# Patient Record
Sex: Female | Born: 1955 | Race: Black or African American | Hispanic: No | Marital: Single | Smoking: Never smoker
Health system: Southern US, Community
[De-identification: ages and names within clinical notes are randomized; demographics above are authoritative.]

## PROBLEM LIST (undated history)

## (undated) DIAGNOSIS — K08109 Complete loss of teeth, unspecified cause, unspecified class: Secondary | ICD-10-CM

## (undated) DIAGNOSIS — I10 Essential (primary) hypertension: Secondary | ICD-10-CM

## (undated) DIAGNOSIS — G5600 Carpal tunnel syndrome, unspecified upper limb: Secondary | ICD-10-CM

## (undated) DIAGNOSIS — N858 Other specified noninflammatory disorders of uterus: Secondary | ICD-10-CM

## (undated) DIAGNOSIS — G5603 Carpal tunnel syndrome, bilateral upper limbs: Secondary | ICD-10-CM

## (undated) DIAGNOSIS — Z972 Presence of dental prosthetic device (complete) (partial): Secondary | ICD-10-CM

## (undated) DIAGNOSIS — M1990 Unspecified osteoarthritis, unspecified site: Secondary | ICD-10-CM

## (undated) DIAGNOSIS — E119 Type 2 diabetes mellitus without complications: Secondary | ICD-10-CM

## (undated) HISTORY — PX: MULTIPLE TOOTH EXTRACTIONS: SHX2053

## (undated) HISTORY — DX: Essential (primary) hypertension: I10

---

## 1998-08-15 ENCOUNTER — Encounter: Payer: Self-pay | Admitting: Infectious Diseases

## 1998-08-15 ENCOUNTER — Ambulatory Visit (HOSPITAL_COMMUNITY): Admission: RE | Admit: 1998-08-15 | Discharge: 1998-08-15 | Payer: Self-pay | Admitting: Infectious Diseases

## 2002-01-06 ENCOUNTER — Ambulatory Visit (HOSPITAL_COMMUNITY): Admission: RE | Admit: 2002-01-06 | Discharge: 2002-01-06 | Payer: Self-pay | Admitting: Family Medicine

## 2002-01-06 ENCOUNTER — Encounter: Payer: Self-pay | Admitting: Family Medicine

## 2002-01-20 ENCOUNTER — Encounter: Admission: RE | Admit: 2002-01-20 | Discharge: 2002-01-20 | Payer: Self-pay | Admitting: Family Medicine

## 2002-01-20 ENCOUNTER — Encounter: Payer: Self-pay | Admitting: Family Medicine

## 2002-05-13 ENCOUNTER — Emergency Department (HOSPITAL_COMMUNITY): Admission: EM | Admit: 2002-05-13 | Discharge: 2002-05-13 | Payer: Self-pay | Admitting: Emergency Medicine

## 2002-06-21 ENCOUNTER — Emergency Department (HOSPITAL_COMMUNITY): Admission: EM | Admit: 2002-06-21 | Discharge: 2002-06-21 | Payer: Self-pay | Admitting: *Deleted

## 2003-10-11 ENCOUNTER — Emergency Department (HOSPITAL_COMMUNITY): Admission: AD | Admit: 2003-10-11 | Discharge: 2003-10-11 | Payer: Self-pay | Admitting: *Deleted

## 2004-03-13 ENCOUNTER — Emergency Department (HOSPITAL_COMMUNITY): Admission: EM | Admit: 2004-03-13 | Discharge: 2004-03-13 | Payer: Self-pay | Admitting: Emergency Medicine

## 2004-04-21 ENCOUNTER — Emergency Department (HOSPITAL_COMMUNITY): Admission: EM | Admit: 2004-04-21 | Discharge: 2004-04-21 | Payer: Self-pay | Admitting: Emergency Medicine

## 2004-12-26 ENCOUNTER — Emergency Department (HOSPITAL_COMMUNITY): Admission: EM | Admit: 2004-12-26 | Discharge: 2004-12-26 | Payer: Self-pay | Admitting: Family Medicine

## 2004-12-27 ENCOUNTER — Ambulatory Visit (HOSPITAL_COMMUNITY): Admission: RE | Admit: 2004-12-27 | Discharge: 2004-12-27 | Payer: Self-pay | Admitting: Family Medicine

## 2004-12-27 ENCOUNTER — Emergency Department (HOSPITAL_COMMUNITY): Admission: EM | Admit: 2004-12-27 | Discharge: 2004-12-27 | Payer: Self-pay | Admitting: Family Medicine

## 2005-01-17 ENCOUNTER — Emergency Department (HOSPITAL_COMMUNITY): Admission: EM | Admit: 2005-01-17 | Discharge: 2005-01-17 | Payer: Self-pay | Admitting: Family Medicine

## 2005-01-17 ENCOUNTER — Ambulatory Visit (HOSPITAL_COMMUNITY): Admission: RE | Admit: 2005-01-17 | Discharge: 2005-01-17 | Payer: Self-pay | Admitting: Family Medicine

## 2005-04-13 ENCOUNTER — Emergency Department (HOSPITAL_COMMUNITY): Admission: EM | Admit: 2005-04-13 | Discharge: 2005-04-13 | Payer: Self-pay | Admitting: Family Medicine

## 2005-06-19 ENCOUNTER — Emergency Department (HOSPITAL_COMMUNITY): Admission: EM | Admit: 2005-06-19 | Discharge: 2005-06-19 | Payer: Self-pay | Admitting: Emergency Medicine

## 2006-12-28 ENCOUNTER — Encounter: Payer: Self-pay | Admitting: Family Medicine

## 2008-01-19 ENCOUNTER — Encounter: Payer: Self-pay | Admitting: Family Medicine

## 2008-07-27 ENCOUNTER — Emergency Department (HOSPITAL_COMMUNITY): Admission: EM | Admit: 2008-07-27 | Discharge: 2008-07-27 | Payer: Self-pay | Admitting: Emergency Medicine

## 2009-01-17 ENCOUNTER — Ambulatory Visit: Payer: Self-pay | Admitting: Family Medicine

## 2009-01-17 DIAGNOSIS — I10 Essential (primary) hypertension: Secondary | ICD-10-CM

## 2009-03-09 ENCOUNTER — Encounter: Payer: Self-pay | Admitting: Family Medicine

## 2009-03-09 ENCOUNTER — Ambulatory Visit: Payer: Self-pay | Admitting: Family Medicine

## 2009-03-09 DIAGNOSIS — E669 Obesity, unspecified: Secondary | ICD-10-CM

## 2009-04-02 ENCOUNTER — Ambulatory Visit: Payer: Self-pay | Admitting: Family Medicine

## 2009-04-02 ENCOUNTER — Encounter: Payer: Self-pay | Admitting: Family Medicine

## 2009-04-02 DIAGNOSIS — R011 Cardiac murmur, unspecified: Secondary | ICD-10-CM

## 2009-04-02 LAB — CONVERTED CEMR LAB
BUN: 10 mg/dL (ref 6–23)
Calcium: 9.4 mg/dL (ref 8.4–10.5)
Chloride: 103 meq/L (ref 96–112)
Creatinine, Ser: 0.79 mg/dL (ref 0.40–1.20)
HCT: 38.1 % (ref 36.0–46.0)
Hemoglobin: 12.7 g/dL (ref 12.0–15.0)
MCHC: 33.3 g/dL (ref 30.0–36.0)
MCV: 87.4 fL (ref 78.0–100.0)
Platelets: 285 10*3/uL (ref 150–400)
RDW: 14.3 % (ref 11.5–15.5)

## 2009-04-04 ENCOUNTER — Encounter: Payer: Self-pay | Admitting: Family Medicine

## 2009-04-20 ENCOUNTER — Encounter: Payer: Self-pay | Admitting: Family Medicine

## 2009-04-20 ENCOUNTER — Ambulatory Visit: Payer: Self-pay

## 2009-04-23 ENCOUNTER — Encounter: Payer: Self-pay | Admitting: Family Medicine

## 2009-05-02 ENCOUNTER — Encounter: Admission: RE | Admit: 2009-05-02 | Discharge: 2009-05-02 | Payer: Self-pay | Admitting: Family Medicine

## 2009-05-24 ENCOUNTER — Encounter: Payer: Self-pay | Admitting: Family Medicine

## 2009-06-01 ENCOUNTER — Ambulatory Visit: Payer: Self-pay | Admitting: Family Medicine

## 2009-06-01 ENCOUNTER — Encounter: Payer: Self-pay | Admitting: Family Medicine

## 2009-06-01 LAB — CONVERTED CEMR LAB
HDL: 41 mg/dL (ref >39)
LDL Cholesterol: 80 mg/dL (ref 0–99)
Triglycerides: 59 mg/dL (ref <150)

## 2009-06-24 ENCOUNTER — Encounter: Payer: Self-pay | Admitting: Family Medicine

## 2009-06-29 ENCOUNTER — Encounter (INDEPENDENT_AMBULATORY_CARE_PROVIDER_SITE_OTHER): Payer: Self-pay | Admitting: Family Medicine

## 2009-06-29 ENCOUNTER — Ambulatory Visit: Payer: Self-pay | Admitting: Family Medicine

## 2009-07-03 ENCOUNTER — Encounter: Payer: Self-pay | Admitting: Family Medicine

## 2009-09-18 ENCOUNTER — Ambulatory Visit: Payer: Self-pay | Admitting: Family Medicine

## 2010-03-29 ENCOUNTER — Encounter: Payer: Self-pay | Admitting: Family Medicine

## 2010-04-22 ENCOUNTER — Encounter: Payer: Self-pay | Admitting: *Deleted

## 2010-04-22 ENCOUNTER — Encounter: Payer: Self-pay | Admitting: Family Medicine

## 2010-04-22 ENCOUNTER — Ambulatory Visit: Payer: Self-pay | Admitting: Family Medicine

## 2010-04-22 DIAGNOSIS — R609 Edema, unspecified: Secondary | ICD-10-CM

## 2010-04-22 LAB — CONVERTED CEMR LAB
CO2: 23 meq/L (ref 19–32)
Calcium: 9.2 mg/dL (ref 8.4–10.5)
Chloride: 106 meq/L (ref 96–112)
Creatinine, Ser: 0.69 mg/dL (ref 0.40–1.20)
Glucose, Bld: 84 mg/dL (ref 70–99)
Sodium: 140 meq/L (ref 135–145)

## 2010-04-23 ENCOUNTER — Encounter: Payer: Self-pay | Admitting: Family Medicine

## 2010-06-24 ENCOUNTER — Encounter: Payer: Self-pay | Admitting: Family Medicine

## 2010-12-08 ENCOUNTER — Encounter: Payer: Self-pay | Admitting: Family Medicine

## 2010-12-09 ENCOUNTER — Encounter: Payer: Self-pay | Admitting: Family Medicine
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
## 2010-12-17 NOTE — Letter (Signed)
Summary: Out of Work  [facility]  [address]   [place], [place] [address]   Phone: [phone]  Fax: [phone]    April 22, 2010   Employee:  Janet Woods    To Whom It May Concern:   For Medical reasons, please excuse the above named employee from work for the following dates:  Start:   April 22, 2010   If you need additional information, please feel free to contact our office.         Sincerely,    Dennison Nancy RN

## 2010-12-17 NOTE — Letter (Signed)
Summary: Generic Letter  [facility]  [address]   [place], [place] [address]   Phone: [phone]  Fax: [phone]    04/23/2010  Janet Woods [address] [place], [place]  [address]  Dear [name],    I just wanted to let you know that your lab results were normal.  Please call me if you have any questions or concerns.          Sincerely,   Asher Muir MD  Appended Document: Generic Letter mailed.

## 2010-12-17 NOTE — Letter (Signed)
Summary: Generic Letter  [facility]  [address]   [place], [place] [address]   Phone: [phone]  Fax: [phone]    03/29/2010  TRINITI GRUETZMACHER [address] [place], [place]  [address]  Dear Ms. Noguez,   You have not been seen in our office in some time.  You are due for a blood pressure check and labs.  Please call [phone] and schedule an office visit.  I look forward to seeing you.         Sincerely,   Asher Muir MD  Appended Document: Generic Letter mailed.

## 2010-12-17 NOTE — Consult Note (Signed)
Summary: [facility]   Imported By: De Nurse 07/04/2010 11:42:39  _____________________________________________________________________  External Attachment:    Type:   Image     Comment:   External Document

## 2011-01-03 ENCOUNTER — Encounter: Payer: Self-pay | Admitting: *Deleted

## 2011-01-21 ENCOUNTER — Encounter: Payer: Self-pay | Admitting: *Deleted

## 2011-04-25 ENCOUNTER — Encounter: Payer: Self-pay | Admitting: *Deleted

## 2011-06-08 ENCOUNTER — Emergency Department (HOSPITAL_COMMUNITY)
Admission: EM | Admit: 2011-06-08 | Discharge: 2011-06-08 | Disposition: A | Discharge status: Home / Self Care | Payer: Self-pay | Attending: Emergency Medicine | Admitting: Emergency Medicine

## 2011-06-08 DIAGNOSIS — L259 Unspecified contact dermatitis, unspecified cause: Secondary | ICD-10-CM | POA: Insufficient documentation

## 2011-06-08 DIAGNOSIS — Z79899 Other long term (current) drug therapy: Secondary | ICD-10-CM | POA: Insufficient documentation

## 2011-06-08 DIAGNOSIS — I10 Essential (primary) hypertension: Secondary | ICD-10-CM | POA: Insufficient documentation

## 2011-07-16 ENCOUNTER — Other Ambulatory Visit: Payer: Self-pay | Admitting: Family Medicine

## 2011-07-16 MED ORDER — HYDROCHLOROTHIAZIDE 25 MG PO TABS: 25.0000 mg | ORAL_TABLET | Freq: Every day | ORAL | Status: DC

## 2012-01-02 ENCOUNTER — Ambulatory Visit (INDEPENDENT_AMBULATORY_CARE_PROVIDER_SITE_OTHER): Payer: BC Managed Care – PPO | Admitting: Family Medicine

## 2012-01-02 ENCOUNTER — Encounter: Payer: Self-pay | Admitting: Family Medicine

## 2012-01-02 DIAGNOSIS — G5600 Carpal tunnel syndrome, unspecified upper limb: Secondary | ICD-10-CM

## 2012-01-02 DIAGNOSIS — G5603 Carpal tunnel syndrome, bilateral upper limbs: Secondary | ICD-10-CM

## 2012-01-02 DIAGNOSIS — I10 Essential (primary) hypertension: Secondary | ICD-10-CM

## 2012-01-02 MED ORDER — NAPROXEN 500 MG PO TABS: 500.0000 mg | ORAL_TABLET | Freq: Two times a day (BID) | ORAL | Status: DC

## 2012-01-02 MED ORDER — KETOROLAC TROMETHAMINE 60 MG/2ML IM SOLN
60.0000 mg | Freq: Once | INTRAMUSCULAR | Status: AC
  Administered 2012-01-02: 60 mg via INTRAMUSCULAR

## 2012-01-02 MED ORDER — KETOROLAC TROMETHAMINE 60 MG/2ML IM SOLN: 60.0000 mg | Freq: Once | INTRAMUSCULAR | Status: DC

## 2012-01-02 MED ORDER — HYDROCHLOROTHIAZIDE 25 MG PO TABS: 25.0000 mg | ORAL_TABLET | Freq: Every day | ORAL | Status: DC

## 2012-01-02 NOTE — Patient Instructions (Signed)
It was nice to meet you today. I will give you a shot of pain medication today. Please go to [facility] and pick up prescriptions for naproxen and HCTZ. Please schedule followup appointment with Dr. Cristal Ford in one month. If symptoms worsen, please return to clinic as needed. Have a good weekend.  Carpal Tunnel Syndrome The carpal tunnel is a narrow hollow area in the wrist. It is formed by the wrist bones and ligaments. Nerves, blood vessels, and tendons (cord like structures which attach muscle to bone) on the palm side (the side of your hand in the direction your fingers bend) of your hand pass through the carpal tunnel. Repeated wrist motion or certain diseases may cause swelling within the tunnel. (That is why these are called repetitive trauma (damage caused by over use) disorders. It is also a common problem in late pregnancy.) This swelling pinches the main nerve in the wrist (median nerve) and causes the painful condition called carpal tunnel syndrome. A feeling of "pins and needles" may be noticed in the fingers or hand; however, the entire arm may ache from this condition. Carpal tunnel syndrome may clear up by itself. Cortisone injections may help. Sometimes, an operation may be needed to free the pinched nerve. An electromyogram (a type of test) may be needed to confirm this diagnosis (learning what is wrong). This is a test which measures nerve conduction. The nerve conduction is usually slowed in a carpal tunnel syndrome. HOME CARE INSTRUCTIONS   If your caregiver prescribed medication to help reduce swelling, take as directed.   If you were given a splint to keep your wrist from bending, use it as instructed. It is important to wear the splint at night. Use the splint for as long as you have pain or numbness in your hand, arm or wrist. This may take 1 to 2 months.   If you have pain at night, it may help to rub or shake your hand, or elevate your hand above the level of your heart (the  center of your chest).   It is important to give your wrist a rest by stopping the activities that are causing the problem. If your symptoms (problems) are work-related, you may need to talk to your employer about changing to a job that does not require using your wrist.   Only take over-the-counter or prescription medicines for pain, discomfort, or fever as directed by your caregiver.   Following periods of extended use, particularly strenuous use, apply an ice pack wrapped in a towel to the anterior (palm) side of the affected wrist for 20 to 30 minutes. Repeat as needed three to four times per day. This will help reduce the swelling.   Follow all instructions for follow-up with your caregiver. This includes any orthopedic referrals, physical therapy, and rehabilitation. Any delay in obtaining necessary care could result in a delay or failure of your condition to heal.  SEEK IMMEDIATE MEDICAL CARE IF:   You are still having pain and numbness following a week of treatment.   You develop new, unexplained symptoms.   Your current symptoms are getting worse and are not helped or controlled with medications.  MAKE SURE YOU:   Understand these instructions.   Will watch your condition.   Will get help right away if you are not doing well or get worse.  Document Released: 10/31/2000 Document Revised: 07/16/2011 Document Reviewed: 06/07/2008 [facility] Patient Information 2012 [place], [place].

## 2012-01-02 NOTE — Progress Notes (Signed)
  Subjective:    Patient ID: [name], female    DOB: [birth year], 56 y.o.   MRN: [id number]  HPI  Patient presented to clinic today for bilateral wrist pain.  Per patient, she has a history of carpal tunnel syndrome. She typically goes to the ER for pain control. Patient has worse pain in her right hand, but left hand started to hurt at work today. She works as a Lawyer. Pain is a 9/10 and became so severe that she had to leave work. Patient has not tried any over-the-counter Tylenol or ibuprofen, because she says it has not worked in the past. The only medication that seems to work is naproxen 500 mg. Patient refuses to pursue surgery as a treatment option because her sister had carpal tunnel surgery and it did not help.  Patient complains of pain mostly in her middle, fourth and fifth fingers and worsening numbness and tingling at night.  Review of Systems  Denies fever, chills, night sweats, erythema, rash or swelling of extremities.     Objective:   Physical Exam  Constitutional: No distress.  HENT:  Mouth/Throat: Oropharynx is clear and moist.  Musculoskeletal:       Mild tenderness on palpation of bilateral fingers, but no swelling or erythema.  No bony tenderness.    Neurological:       Able to flex and extend wrists and fingers.  5/5 UE motor strength.  Sensation intact.         Assessment & Plan:

## 2012-01-02 NOTE — Progress Notes (Signed)
Addended by: Deno Etienne on: 01/02/2012 01:04 PM   Modules accepted: Orders

## 2012-01-02 NOTE — Assessment & Plan Note (Addendum)
Pressure elevated today 160/92.  Patient is running out of HCTZ. Will refill prescription with one refill. Advised patient to schedule followup appointment with Dr. Cristal Ford in one month to discuss chronic issues. Advised patient to be a low-sodium diet and increase physical activity.

## 2012-01-02 NOTE — Assessment & Plan Note (Addendum)
Naproxen 500 mg twice a day with meals for one month. Toradol 60 mg IM given in office today. Advised patient to wear her wrist splint over the weekend. Last BMET was in 2011 and creatinine was within normal limits. Patient is not interested in nerve conduction studies or surgery at this time. Follow-up with PCP next month to discuss further treatment and plan.

## 2012-02-27 ENCOUNTER — Telehealth: Payer: Self-pay | Admitting: Family Medicine

## 2012-02-27 NOTE — Telephone Encounter (Signed)
Patient is calling needing a statement for workplace stating that she did have a physical and lab work last year.  Please call her when it is ready to be picked up.

## 2012-03-01 NOTE — Telephone Encounter (Signed)
Guess she needs an appointment for CPE. Thanks Lupita Leash.

## 2012-03-01 NOTE — Telephone Encounter (Signed)
Last physical was 06/29/2009.  Last labs drawn was a BMET on 04/22/2010.  Patient did not have a physical last year according to our records. Called patient but there was no answer.  Will try calling again later.  Ileana Ladd

## 2012-03-01 NOTE — Telephone Encounter (Signed)
Spoke with Ms. Janet Woods.  Informed her that her last CPE was in 2010.  Requesting an appointment this week due to her employer is taking extra money out of her paycheck until she has documentation of a recent physical.   Appointment scheduled for CPE with Dr. Earnest Bailey 03/04/12 @ 8:45am.  .  Ileana Ladd

## 2012-03-01 NOTE — Telephone Encounter (Signed)
Can you help me with this?

## 2012-03-04 ENCOUNTER — Other Ambulatory Visit (HOSPITAL_COMMUNITY)
Admission: RE | Admit: 2012-03-04 | Discharge: 2012-03-04 | Disposition: A | Discharge status: Home / Self Care | Payer: BC Managed Care – PPO | Source: Ambulatory Visit | Attending: Family Medicine | Admitting: Family Medicine

## 2012-03-04 ENCOUNTER — Ambulatory Visit (INDEPENDENT_AMBULATORY_CARE_PROVIDER_SITE_OTHER): Payer: BC Managed Care – PPO | Admitting: Family Medicine

## 2012-03-04 ENCOUNTER — Encounter: Payer: Self-pay | Admitting: Family Medicine

## 2012-03-04 VITALS — BP 141/91 | HR 90 | Temp 98.3°F | Ht 65.0 in | Wt 250.2 lb

## 2012-03-04 DIAGNOSIS — Z01419 Encounter for gynecological examination (general) (routine) without abnormal findings: Secondary | ICD-10-CM | POA: Insufficient documentation

## 2012-03-04 DIAGNOSIS — R0609 Other forms of dyspnea: Secondary | ICD-10-CM

## 2012-03-04 DIAGNOSIS — E669 Obesity, unspecified: Secondary | ICD-10-CM | POA: Insufficient documentation

## 2012-03-04 DIAGNOSIS — Z124 Encounter for screening for malignant neoplasm of cervix: Secondary | ICD-10-CM

## 2012-03-04 DIAGNOSIS — R0600 Dyspnea, unspecified: Secondary | ICD-10-CM

## 2012-03-04 DIAGNOSIS — R609 Edema, unspecified: Secondary | ICD-10-CM

## 2012-03-04 DIAGNOSIS — G5603 Carpal tunnel syndrome, bilateral upper limbs: Secondary | ICD-10-CM

## 2012-03-04 DIAGNOSIS — Z0000 Encounter for general adult medical examination without abnormal findings: Secondary | ICD-10-CM

## 2012-03-04 DIAGNOSIS — G5600 Carpal tunnel syndrome, unspecified upper limb: Secondary | ICD-10-CM

## 2012-03-04 DIAGNOSIS — I10 Essential (primary) hypertension: Secondary | ICD-10-CM

## 2012-03-04 MED ORDER — HYDROCHLOROTHIAZIDE 25 MG PO TABS: 25.0000 mg | ORAL_TABLET | Freq: Every day | ORAL | Status: DC

## 2012-03-04 MED ORDER — NAPROXEN 500 MG PO TABS: 500.0000 mg | ORAL_TABLET | Freq: Two times a day (BID) | ORAL | Status: DC

## 2012-03-04 NOTE — Progress Notes (Signed)
  Subjective:    Patient ID: Janet Woods, female    DOB: Mar 31, 1956, 56 y.o.   MRN: [id number]  HPI Annual Gynecological Exam  G2P2 Wt Readings from Last 3 Encounters:  03/04/12 250 lb 3.2 oz (113.49 kg)  01/02/12 233 lb (105.688 kg)  04/22/10 234 lb (106.142 kg)   Last period:  postmenopausal Regular periods: no Heavy bleeding: no  Sexually active: no Birth control or hormonal therapy:no Hx of STD: Patient desires STD screening Dyspareunia: No Hot flashes: No Vaginal discharge:  Dysuria:No   Last mammogram: never  Breast mass or concerns: No Last Pap: not due  History of abnormal pap: No   FH of breast, uterine, ovarian, colon cancer: No  Carpal tunnel:  Swelling in hands, a little in feet.  For many months.  Some dyspnea, attributes this to weight gain.  Increased 17 pounds in the past 2 months.  Numbness and tingling in all 4 fingers not in thumb.  Bilateral, sometimes wakes her up from sleep.  Feels grip is weaker.   Review of Systems See HPI    Objective:   Physical Exam GEN: Alert & Oriented, No acute distress CV:  Regular Rate & Rhythm, no murmur Respiratory:  Normal work of breathing, CTAB Abd:  + BS, soft, no tenderness to palpation Ext: no pre-tibial edema MSK:  Neg tinel and phalen.         Assessment & Plan:  Physical exam:  Given referral for mammography.  Fasting labs performed.  Screening otherwise up todate

## 2012-03-04 NOTE — Patient Instructions (Addendum)
Will get labwork to check for diabetes, cholesterol, anemia, kidney and liver function and thyroid.  Cut back on added salt- can cause yoru blood pressure to be higher and cause you to retain fluid  I refilled naproxen- careful- daily use can cause your blood pressure to be higher and can irritate your stomach lining  See info on carpal tunnel- wear a wrist brace nightly  See info for mammogram  Make appointment to see your primary doctor in 2-3 weeks to follow-up on your swelling, your wrist, and your labs

## 2012-03-05 ENCOUNTER — Encounter: Payer: Self-pay | Admitting: Family Medicine

## 2012-03-05 LAB — COMPREHENSIVE METABOLIC PANEL
Alkaline Phosphatase: 56 U/L (ref 39–117)
CO2: 26 mEq/L (ref 19–32)
Creat: 0.7 mg/dL (ref 0.50–1.10)
Glucose, Bld: 98 mg/dL (ref 70–99)
Total Bilirubin: 0.2 mg/dL — ABNORMAL LOW (ref 0.3–1.2)

## 2012-03-05 LAB — CBC
Hemoglobin: 12.2 g/dL (ref 12.0–15.0)
MCH: 27.9 pg (ref 26.0–34.0)
MCHC: 31 g/dL (ref 30.0–36.0)
MCV: 89.9 fL (ref 78.0–100.0)
RBC: 4.37 MIL/uL (ref 3.87–5.11)

## 2012-03-05 LAB — BRAIN NATRIURETIC PEPTIDE: Brain Natriuretic Peptide: 14.4 pg/mL (ref 0.0–100.0)

## 2012-03-05 LAB — LIPID PANEL
Cholesterol: 166 mg/dL (ref 0–200)
Triglycerides: 97 mg/dL (ref <150)

## 2012-03-05 LAB — TSH: TSH: 1.221 u[IU]/mL (ref 0.350–4.500)

## 2012-03-05 NOTE — Assessment & Plan Note (Signed)
Slightly elevated- advised to cut back on added salt uses a lot extra daily) which can help with HTN control and edema.  Will get labs and follow-up with PCP in 2-3 weeks

## 2012-03-05 NOTE — Assessment & Plan Note (Signed)
Not classic for carpal tunnel.  Will give her a right hand wrist splint today to wear, see if this helps with her most affected hand.  If no improvement, may consider referral for NCS.

## 2012-03-05 NOTE — Assessment & Plan Note (Signed)
Some LE, mostly hands.  Possibly due to nerve compression.  BNP and Cr normal.  Non inflammatory disease. Will follow-up with PCP for further history and full evaluation.

## 2012-04-19 ENCOUNTER — Telehealth: Payer: Self-pay | Admitting: Family Medicine

## 2012-04-19 NOTE — Telephone Encounter (Signed)
FMLA paper to be completed by [employer]. Pt takes care of daughter, [name] DOB 11/26/86

## 2012-04-21 NOTE — Telephone Encounter (Signed)
Dates of service filled in as requested by Dr. Cristal Ford.  Copy placed up front to be scanned.  Ms. Schubring notified FMLA forms are ready to be picked up at front desk.  Janet Woods

## 2012-04-21 NOTE — Telephone Encounter (Signed)
Form completed with exception of dates of service. Cannot find dates in Epic. Returned to [employer] for assistance.

## 2012-07-01 ENCOUNTER — Telehealth: Payer: Self-pay | Admitting: Family Medicine

## 2012-07-01 ENCOUNTER — Encounter: Payer: Self-pay | Admitting: *Deleted

## 2012-07-01 NOTE — Telephone Encounter (Signed)
Patient needs a note stating that she had a Pap smear done in the past year. Needs the date and the provider's names who perform pap. Please call patient when completed.

## 2012-07-01 NOTE — Telephone Encounter (Signed)
Called patient and informed her that letter is up front to be picked up

## 2012-07-15 ENCOUNTER — Encounter: Payer: Self-pay | Admitting: Family Medicine

## 2012-07-15 ENCOUNTER — Ambulatory Visit (INDEPENDENT_AMBULATORY_CARE_PROVIDER_SITE_OTHER): Payer: BC Managed Care – PPO | Admitting: Family Medicine

## 2012-07-15 VITALS — BP 134/81 | HR 66 | Temp 98.9°F | Wt 273.5 lb

## 2012-07-15 DIAGNOSIS — I10 Essential (primary) hypertension: Secondary | ICD-10-CM

## 2012-07-15 DIAGNOSIS — R609 Edema, unspecified: Secondary | ICD-10-CM

## 2012-07-15 DIAGNOSIS — G5600 Carpal tunnel syndrome, unspecified upper limb: Secondary | ICD-10-CM

## 2012-07-15 DIAGNOSIS — G5603 Carpal tunnel syndrome, bilateral upper limbs: Secondary | ICD-10-CM

## 2012-07-15 DIAGNOSIS — K219 Gastro-esophageal reflux disease without esophagitis: Secondary | ICD-10-CM | POA: Insufficient documentation

## 2012-07-15 MED ORDER — NAPROXEN 500 MG PO TABS: 500.0000 mg | ORAL_TABLET | Freq: Two times a day (BID) | ORAL | Status: DC

## 2012-07-15 MED ORDER — ESOMEPRAZOLE MAGNESIUM 40 MG PO CPDR: 40.0000 mg | DELAYED_RELEASE_CAPSULE | Freq: Every day | ORAL | Status: DC

## 2012-07-15 NOTE — Assessment & Plan Note (Signed)
Well controlled, will refill HCTZ

## 2012-07-15 NOTE — Progress Notes (Signed)
  Subjective:    Patient ID: Janet Woods, female    DOB: October 26, 1956, 56 y.o.   MRN: [id number]  HPI Here for eval GERD., Carpal tunnel, refill HTN  GERD: Burning sensation.  Triggered by certain foods.  Was given nexium from an urgent care doctor while on vacation earlier this summer.  Resolved her heartburn.  Started to come back when not taking.  No dyspnea, nause, emesis.  Carpal tunnel.  Previously given wrist brace to wear at night for right hand.  Has decreased symptoms dramatically.  Also wearing now on her left wrist with improvement.  Takes naproxen once a day which also helps.  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  Hypertension:  Doing well.  NO chest pain, dyspnea.  Som intermittent left leg edema- previously has surgery in that leg- none new.  Review of Systemssee HPI     Objective:   Physical Exam GEN: Alert & Oriented, No acute distress CV:  Regular Rate & Rhythm, no murmur Respiratory:  Normal work of breathing, CTAB Ext: no pre-tibial edema Wrist: neg tinel, phalen       Assessment & Plan:

## 2012-07-15 NOTE — Assessment & Plan Note (Signed)
Improved- doing well with conservative management with night braces and naproxen.  Does not desire surgery.  Will continue treatment for now

## 2012-07-15 NOTE — Assessment & Plan Note (Signed)
Minimal today.  Reassured, discussed signs of DVT for return.

## 2012-07-15 NOTE — Assessment & Plan Note (Signed)
Clinical symptoms consistent with GERD.  Will refill nexium per patient request, advised on diet modification that can also help with symtpoms.

## 2012-07-15 NOTE — Patient Instructions (Addendum)
Diet for GERD or PUD Nutrition therapy can help ease the discomfort of gastroesophageal reflux disease (GERD) and peptic ulcer disease (PUD).  HOME CARE INSTRUCTIONS   Eat your meals slowly, in a relaxed setting.   Eat 5 to 6 small meals per day.   If a food causes distress, stop eating it for a period of time.  FOODS TO AVOID  Coffee, regular or decaffeinated.   Cola beverages, regular or low calorie.   Tea, regular or decaffeinated.   Pepper.   Cocoa.   High fat foods, including meats.   Butter, margarine, hydrogenated oil (trans fats).   Peppermint or spearmint (if you have GERD).   Fruits and vegetables if not tolerated.   Alcohol.   Nicotine (smoking or chewing). This is one of the most potent stimulants to acid production in the gastrointestinal tract.   Any food that seems to aggravate your condition.  If you have questions regarding your diet, ask your caregiver or a registered dietitian. TIPS  Lying flat may make symptoms worse. Keep the head of your bed raised 6 to 9 inches (15 to 23 cm) by using a foam wedge or blocks under the legs of the bed.   Do not lay down until 3 hours after eating a meal.   Daily physical activity may help reduce symptoms.  MAKE SURE YOU:   Understand these instructions.   Will watch your condition.   Will get help right away if you are not doing well or get worse.  Document Released: 11/03/2005 Document Revised: 10/23/2011 Document Reviewed: 09/19/2011 [employer] Patient Information 2012 [employer]. 

## 2012-07-28 ENCOUNTER — Other Ambulatory Visit: Payer: Self-pay | Admitting: Family Medicine

## 2012-07-28 DIAGNOSIS — Z1231 Encounter for screening mammogram for malignant neoplasm of breast: Secondary | ICD-10-CM

## 2012-07-31 ENCOUNTER — Encounter (HOSPITAL_COMMUNITY): Payer: Self-pay | Admitting: Emergency Medicine

## 2012-07-31 ENCOUNTER — Emergency Department (HOSPITAL_COMMUNITY)
Admission: EM | Admit: 2012-07-31 | Discharge: 2012-07-31 | Disposition: A | Discharge status: Home / Self Care | Payer: BC Managed Care – PPO | Attending: Emergency Medicine | Admitting: Emergency Medicine

## 2012-07-31 DIAGNOSIS — M25519 Pain in unspecified shoulder: Secondary | ICD-10-CM | POA: Insufficient documentation

## 2012-07-31 DIAGNOSIS — I10 Essential (primary) hypertension: Secondary | ICD-10-CM | POA: Insufficient documentation

## 2012-07-31 DIAGNOSIS — M542 Cervicalgia: Secondary | ICD-10-CM | POA: Insufficient documentation

## 2012-07-31 DIAGNOSIS — M436 Torticollis: Secondary | ICD-10-CM

## 2012-07-31 NOTE — ED Notes (Signed)
Pt has wrist BP device with multiple varying BPs which she does not understand. When explained to her that BP changes all the time depending on what you are doing, she stated she had a stiff neck for two days and thinks she may have slept on it wrong.

## 2012-07-31 NOTE — ED Provider Notes (Signed)
History     CSN: [id number]  Arrival date & time 07/31/12  1018   First MD Initiated Contact with Patient 07/31/12 1155      Chief Complaint  Patient presents with  . Neck Pain    (Consider location/radiation/quality/duration/timing/severity/associated sxs/prior treatment) HPI  Patient with right neck pain after sleeping in chair Thursday night.  Pain in right shoulder with neck movement.  She denies numbness, tingling, weakness, or headache. Patient also states that she has been taking her bp frequently and has noted varying levels.  She is unable to give me the numbers.  She denies chest pain, sob, abdominal pain, nausea, vomiting.    Past Medical History  Diagnosis Date  . Hypertension     History reviewed. No pertinent past surgical history.  Family History  Problem Relation Age of Onset  . Heart disease Mother 40    MI  . Cancer Sister 31    lung cancer  . Stroke Maternal Grandmother   . Diabetes Neg Hx     History  Substance Use Topics  . Smoking status: Never Smoker   . Smokeless tobacco: Never Used  . Alcohol Use: No    OB History    Grav Para Term Preterm Abortions TAB SAB Ect Mult Living                  Review of Systems  Constitutional: Negative for fever, chills, activity change, appetite change and unexpected weight change.  HENT: Negative for sore throat, rhinorrhea, neck pain, neck stiffness and sinus pressure.   Eyes: Negative for visual disturbance.  Respiratory: Negative for cough and shortness of breath.   Cardiovascular: Negative for chest pain and leg swelling.  Gastrointestinal: Negative for vomiting, abdominal pain, diarrhea and blood in stool.  Genitourinary: Negative for dysuria, urgency, frequency, vaginal discharge and difficulty urinating.  Musculoskeletal: Negative for myalgias, arthralgias and gait problem.  Skin: Negative for color change and rash.  Neurological: Negative for weakness, light-headedness and headaches.    Hematological: Does not bruise/bleed easily.  Psychiatric/Behavioral: Negative for dysphoric mood.    Allergies  Review of patient's allergies indicates no known allergies.  Home Medications   Current Outpatient Rx  Name Route Sig Dispense Refill  . HYDROCHLOROTHIAZIDE 25 MG PO TABS Oral Take 25 mg by mouth daily.    Marland Kitchen NAPROXEN 500 MG PO TABS Oral Take 500 mg by mouth 2 (two) times daily with a meal.      BP 137/76  Pulse 61  Temp 98 F (36.7 C) (Oral)  Resp 18  Ht 5' 6.5" (1.689 m)  Wt 235 lb (106.595 kg)  BMI 37.36 kg/m2  SpO2 98%  Physical Exam  Nursing note and vitals reviewed. Constitutional: She appears well-developed and well-nourished.  HENT:  Head: Normocephalic and atraumatic.  Eyes: Conjunctivae normal and EOM are normal. Pupils are equal, round, and reactive to light.  Neck: Normal range of motion. Neck supple.       Some pain with rotation of neck to right and lateral flexion to right. Mild trapezius tenderness.  No cervical spine tenderness.   Cardiovascular: Normal rate, regular rhythm, normal heart sounds and intact distal pulses.   Pulmonary/Chest: Effort normal and breath sounds normal.  Abdominal: Soft. Bowel sounds are normal.  Musculoskeletal: Normal range of motion.  Neurological: She is alert.  Skin: Skin is warm and dry.  Psychiatric: She has a normal mood and affect. Thought content normal.    ED Course  Procedures (including critical  care time)  Labs Reviewed - No data to display No results found.   No diagnosis found.    MDM  Normal exam except morbidly obese.  BP elevated at 137 at 141/76 or 86  Patient advised to record bp twice daily in log and follow up with pmd.  No trauma to neck and no neuro symptoms.  No imaging appears indicated.  Patient advised to use acetaminophen.       Hilario Quarry, MD 07/31/12 [phone]

## 2012-09-01 ENCOUNTER — Ambulatory Visit: Payer: BC Managed Care – PPO

## 2012-09-16 ENCOUNTER — Other Ambulatory Visit: Payer: Self-pay | Admitting: Family Medicine

## 2013-03-16 ENCOUNTER — Ambulatory Visit (INDEPENDENT_AMBULATORY_CARE_PROVIDER_SITE_OTHER): Payer: BC Managed Care – PPO | Admitting: Family Medicine

## 2013-03-16 ENCOUNTER — Encounter: Payer: Self-pay | Admitting: Family Medicine

## 2013-03-16 VITALS — BP 132/83 | HR 76 | Temp 98.0°F | Ht 66.5 in | Wt 234.0 lb

## 2013-03-16 DIAGNOSIS — G5600 Carpal tunnel syndrome, unspecified upper limb: Secondary | ICD-10-CM

## 2013-03-16 DIAGNOSIS — Z0000 Encounter for general adult medical examination without abnormal findings: Secondary | ICD-10-CM

## 2013-03-16 DIAGNOSIS — G5603 Carpal tunnel syndrome, bilateral upper limbs: Secondary | ICD-10-CM

## 2013-03-16 DIAGNOSIS — I10 Essential (primary) hypertension: Secondary | ICD-10-CM

## 2013-03-16 DIAGNOSIS — Z01419 Encounter for gynecological examination (general) (routine) without abnormal findings: Secondary | ICD-10-CM

## 2013-03-16 DIAGNOSIS — E669 Obesity, unspecified: Secondary | ICD-10-CM

## 2013-03-16 LAB — CBC
MCH: 28 pg (ref 26.0–34.0)
MCHC: 33.1 g/dL (ref 30.0–36.0)
RDW: 15.2 % (ref 11.5–15.5)

## 2013-03-16 LAB — VITAMIN B12: Vitamin B-12: 521 pg/mL (ref 211–911)

## 2013-03-16 LAB — TSH: TSH: 1.222 u[IU]/mL (ref 0.350–4.500)

## 2013-03-16 MED ORDER — HYDROCHLOROTHIAZIDE 25 MG PO TABS: ORAL_TABLET | ORAL | Status: DC

## 2013-03-16 MED ORDER — NAPROXEN 500 MG PO TABS: 500.0000 mg | ORAL_TABLET | Freq: Two times a day (BID) | ORAL | Status: DC | PRN

## 2013-03-16 NOTE — Patient Instructions (Addendum)
Will check screening labs. Get your mammogram done. If your carpal tunnel bothers you more, can see Sports Medicine clinic for another shot. Do your best with exercise for healthy weight to avoid diabetes, cholesterol and worsened BP.  Health Maintenance, Females A healthy lifestyle and preventative care can promote health and wellness.  Maintain regular health, dental, and eye exams.  Eat a healthy diet. Foods like vegetables, fruits, whole grains, low-fat dairy products, and lean protein foods contain the nutrients you need without too many calories. Decrease your intake of foods high in solid fats, added sugars, and salt. Get information about a proper diet from your caregiver, if necessary.  Regular physical exercise is one of the most important things you can do for your health. Most adults should get at least 150 minutes of moderate-intensity exercise (any activity that increases your heart rate and causes you to sweat) each week. In addition, most adults need muscle-strengthening exercises on 2 or more days a week.   Maintain a healthy weight. The body mass index (BMI) is a screening tool to identify possible weight problems. It provides an estimate of body fat based on height and weight. Your caregiver can help determine your BMI, and can help you achieve or maintain a healthy weight. For adults 20 years and older:  A BMI below 18.5 is considered underweight.  A BMI of 18.5 to 24.9 is normal.  A BMI of 25 to 29.9 is considered overweight.  A BMI of 30 and above is considered obese.  Maintain normal blood lipids and cholesterol by exercising and minimizing your intake of saturated fat. Eat a balanced diet with plenty of fruits and vegetables. Blood tests for lipids and cholesterol should begin at age 44 and be repeated every 5 years. If your lipid or cholesterol levels are high, you are over 50, or you are a high risk for heart disease, you may need your cholesterol levels checked  more frequently.Ongoing high lipid and cholesterol levels should be treated with medicines if diet and exercise are not effective.  If you smoke, find out from your caregiver how to quit. If you do not use tobacco, do not start.  If you are pregnant, do not drink alcohol. If you are breastfeeding, be very cautious about drinking alcohol. If you are not pregnant and choose to drink alcohol, do not exceed 1 drink per day. One drink is considered to be 12 ounces (355 mL) of beer, 5 ounces (148 mL) of wine, or 1.5 ounces (44 mL) of liquor.  Avoid use of street drugs. Do not share needles with anyone. Ask for help if you need support or instructions about stopping the use of drugs.  High blood pressure causes heart disease and increases the risk of stroke. Blood pressure should be checked at least every 1 to 2 years. Ongoing high blood pressure should be treated with medicines, if weight loss and exercise are not effective.  If you are 26 to 56 years old, ask your caregiver if you should take aspirin to prevent strokes.  Diabetes screening involves taking a blood sample to check your fasting blood sugar level. This should be done once every 3 years, after age 57, if you are within normal weight and without risk factors for diabetes. Testing should be considered at a younger age or be carried out more frequently if you are overweight and have at least 1 risk factor for diabetes.  Breast cancer screening is essential preventative care for women. You should practice "  breast self-awareness." This means understanding the normal appearance and feel of your breasts and may include breast self-examination. Any changes detected, no matter how small, should be reported to a caregiver. Women in their 53s and 30s should have a clinical breast exam (CBE) by a caregiver as part of a regular health exam every 1 to 3 years. After age 52, women should have a CBE every year. Starting at age 36, women should consider having  a mammogram (breast X-ray) every year. Women who have a family history of breast cancer should talk to their caregiver about genetic screening. Women at a high risk of breast cancer should talk to their caregiver about having an MRI and a mammogram every year.  The Pap test is a screening test for cervical cancer. Women should have a Pap test starting at age 70. Between ages 30 and 51, Pap tests should be repeated every 2 years. Beginning at age 65, you should have a Pap test every 3 years as long as the past 3 Pap tests have been normal. If you had a hysterectomy for a problem that was not cancer or a condition that could lead to cancer, then you no longer need Pap tests. If you are between ages 41 and 61, and you have had normal Pap tests going back 10 years, you no longer need Pap tests. If you have had past treatment for cervical cancer or a condition that could lead to cancer, you need Pap tests and screening for cancer for at least 20 years after your treatment. If Pap tests have been discontinued, risk factors (such as a new sexual partner) need to be reassessed to determine if screening should be resumed. Some women have medical problems that increase the chance of getting cervical cancer. In these cases, your caregiver may recommend more frequent screening and Pap tests.  The human papillomavirus (HPV) test is an additional test that may be used for cervical cancer screening. The HPV test looks for the virus that can cause the cell changes on the cervix. The cells collected during the Pap test can be tested for HPV. The HPV test could be used to screen women aged 26 years and older, and should be used in women of any age who have unclear Pap test results. After the age of 66, women should have HPV testing at the same frequency as a Pap test.  Colorectal cancer can be detected and often prevented. Most routine colorectal cancer screening begins at the age of 40 and continues through age 32. However,  your caregiver may recommend screening at an earlier age if you have risk factors for colon cancer. On a yearly basis, your caregiver may provide home test kits to check for hidden blood in the stool. Use of a small camera at the end of a tube, to directly examine the colon (sigmoidoscopy or colonoscopy), can detect the earliest forms of colorectal cancer. Talk to your caregiver about this at age 71, when routine screening begins. Direct examination of the colon should be repeated every 5 to 10 years through age 72, unless early forms of pre-cancerous polyps or small growths are found.  Hepatitis C blood testing is recommended for all people born from [birth year] through 1963 and any individual with known risks for hepatitis C.  Practice safe sex. Use condoms and avoid high-risk sexual practices to reduce the spread of sexually transmitted infections (STIs). Sexually active women aged 73 and younger should be checked for Chlamydia, which is a common  sexually transmitted infection. Older women with new or multiple partners should also be tested for Chlamydia. Testing for other STIs is recommended if you are sexually active and at increased risk.  Osteoporosis is a disease in which the bones lose minerals and strength with aging. This can result in serious bone fractures. The risk of osteoporosis can be identified using a bone density scan. Women ages 48 and over and women at risk for fractures or osteoporosis should discuss screening with their caregivers. Ask your caregiver whether you should be taking a calcium supplement or vitamin D to reduce the rate of osteoporosis.  Menopause can be associated with physical symptoms and risks. Hormone replacement therapy is available to decrease symptoms and risks. You should talk to your caregiver about whether hormone replacement therapy is right for you.  Use sunscreen with a sun protection factor (SPF) of 30 or greater. Apply sunscreen liberally and repeatedly  throughout the day. You should seek shade when your shadow is shorter than you. Protect yourself by wearing long sleeves, pants, a wide-brimmed hat, and sunglasses year round, whenever you are outdoors.  Notify your caregiver of new moles or changes in moles, especially if there is a change in shape or color. Also notify your caregiver if a mole is larger than the size of a pencil eraser.  Stay current with your immunizations. Document Released: 05/19/2011 Document Revised: 01/26/2012 Document Reviewed: 05/19/2011 [facility] Patient Information 2013 [place], [place].

## 2013-03-16 NOTE — Assessment & Plan Note (Signed)
Check TSH, vit D, b12 to rule out organic triggers. She refuses referral for injection or further management at this time. Continue NSAID prn and wrist splints at night. F/u prn.

## 2013-03-16 NOTE — Progress Notes (Signed)
  Subjective:     Janet Woods is a 56 y.o. female and is here for a comprehensive physical exam. The patient reports no problems.  She has a chronic carpal tunnel problem, has pain, numbness and tingling in her bilateral first three digits. She is not interested in pursuiting further evaluation, treatment including surgery or injection (she tried injection previously that did not help). 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  HTN. Doesn't check at home. Her weight is stable, had gained 15 lbs last year but now has lost again. She request refill of HCTZ. Denies chest pain, dyspnea, cough, edema, abdominal pain, abnormal vaginal bleeding, breast lumps, nausea, emesis, rash, visual changes, foot/toe numbness, weight gain, polyuria, urinary changes, depression.  Post-menopausal.  Last pap normal 2013.  Due for mammogram. Colonoscopy normal in 2012.   History   Social History  . Marital Status: Single    Spouse Name: N/A    Number of Children: N/A  . Years of Education: N/A   Occupational History  . Not on file.   Social History Main Topics  . Smoking status: Never Smoker   . Smokeless tobacco: Never Used  . Alcohol Use: No  . Drug Use: No  . Sexually Active: Not Currently    Birth Control/ Protection: None   Other Topics Concern  . Not on file   Social History Narrative   Lives with daughter with CP Janet Woods age 26).  No long term relationship.  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   Health Maintenance  Topic Date Due  . Mammogram  05/03/2011  . Influenza Vaccine  07/18/2013  . Pap Smear  03/05/2015  . Tetanus/tdap  06/30/2019  . Colonoscopy  06/24/2021    The following portions of the patient's history were reviewed and updated as appropriate: allergies, current medications, past family history, past medical history, past social history, past  surgical history and problem list.  Review of Systems Pertinent items are noted in HPI.   Objective:    BP 132/83  Pulse 76  Temp(Src) 98 F (36.7 C) (Oral)  Ht 5' 6.5" (1.689 m)  Wt 234 lb (106.142 kg)  BMI 37.21 kg/m2 General appearance: alert, cooperative, appears stated age and no distress Head: Normocephalic, without obvious abnormality, atraumatic Eyes: conjunctivae/corneas clear. PERRL, EOM's intact. Fundi benign. Nose: Nares normal. Septum midline. Mucosa normal. No drainage or sinus tenderness. Throat: lips, mucosa, and tongue normal; teeth and gums normal Neck: no adenopathy and thyroid not enlarged, symmetric, no tenderness/mass/nodules Lungs: clear to auscultation bilaterally Breasts: normal appearance, no masses or tenderness Heart: regular rate and rhythm, S1, S2 normal, no murmur, click, rub or gallop Abdomen: soft, non-tender; bowel sounds normal; no masses,  no organomegaly Extremities: extremities normal, atraumatic, no cyanosis or edema. Negative phalen and tinel signs. 5/5 UE and grip/pincer strength. Sensation to light touch intact in hands and UE.  Pulses: 2+ and symmetric Neurologic: Grossly normal    Assessment:    Healthy female exam. Obesity. HTN controlled. Carpal tunnel syndrome chronic.      Plan:     See After Visit Summary for Counseling Recommendations

## 2013-03-16 NOTE — Assessment & Plan Note (Signed)
Discussed importance of healthy weight to avoid chronic disease. Discussed Body mass index is 37.21 kg/(m^2). check screening fasting labs today.

## 2013-03-16 NOTE — Assessment & Plan Note (Signed)
At goal. Refill HCTZ. Check labs today.

## 2013-03-17 ENCOUNTER — Other Ambulatory Visit: Payer: Self-pay | Admitting: Family Medicine

## 2013-03-17 ENCOUNTER — Telehealth: Payer: Self-pay | Admitting: Family Medicine

## 2013-03-17 ENCOUNTER — Encounter: Payer: Self-pay | Admitting: Family Medicine

## 2013-03-17 DIAGNOSIS — E559 Vitamin D deficiency, unspecified: Secondary | ICD-10-CM | POA: Insufficient documentation

## 2013-03-17 LAB — VITAMIN D 25 HYDROXY (VIT D DEFICIENCY, FRACTURES): Vit D, 25-Hydroxy: 15 ng/mL — ABNORMAL LOW (ref 30–89)

## 2013-03-17 LAB — COMPREHENSIVE METABOLIC PANEL
ALT: 17 U/L (ref 0–35)
AST: 16 U/L (ref 0–37)
Albumin: 3.9 g/dL (ref 3.5–5.2)
Alkaline Phosphatase: 57 U/L (ref 39–117)
Calcium: 9.1 mg/dL (ref 8.4–10.5)
Chloride: 106 mEq/L (ref 96–112)
Potassium: 3.8 mEq/L (ref 3.5–5.3)
Sodium: 141 mEq/L (ref 135–145)

## 2013-03-17 LAB — LIPID PANEL
LDL Cholesterol: 105 mg/dL — ABNORMAL HIGH (ref 0–99)
Total CHOL/HDL Ratio: 3.2 Ratio

## 2013-03-17 LAB — HEPATITIS C ANTIBODY: HCV Ab: NEGATIVE

## 2013-03-17 MED ORDER — ERGOCALCIFEROL 1.25 MG (50000 UT) PO CAPS: 50000.0000 [IU] | ORAL_CAPSULE | ORAL | Status: DC

## 2013-03-17 NOTE — Telephone Encounter (Signed)
Spoke with patient and informed her of below 

## 2013-03-17 NOTE — Telephone Encounter (Signed)
Please notify pt her labs normal except low Vitamin D. I sent rx for replacement- one pill weekly x 12 weeks, then we should check level again next time she comes in for visit.

## 2013-04-13 ENCOUNTER — Other Ambulatory Visit: Payer: BC Managed Care – PPO

## 2013-05-17 ENCOUNTER — Encounter: Payer: Self-pay | Admitting: *Deleted

## 2013-05-17 ENCOUNTER — Telehealth: Payer: Self-pay | Admitting: Family Medicine

## 2013-05-17 ENCOUNTER — Other Ambulatory Visit: Payer: BC Managed Care – PPO

## 2013-05-17 NOTE — Telephone Encounter (Signed)
Pt came for lab appt this am, stating she needed Vit D checked.  No orders in chart.  Last ov 03-16-13 states to return for ov and Vit D check in 12 weeks.  States front told her it was okay to come in for lab to be drawn.  Explained what MD note states and that we are unable to draw Vit D at this time as well as being too early.  Pt rescheduled for an ov. Dewitt Hoes, MLS

## 2013-06-07 ENCOUNTER — Ambulatory Visit: Payer: BC Managed Care – PPO

## 2013-06-15 ENCOUNTER — Ambulatory Visit: Payer: BC Managed Care – PPO | Admitting: Family Medicine

## 2013-06-24 ENCOUNTER — Ambulatory Visit: Payer: BC Managed Care – PPO

## 2013-07-01 ENCOUNTER — Encounter: Payer: Self-pay | Admitting: Family Medicine

## 2013-07-01 ENCOUNTER — Ambulatory Visit (INDEPENDENT_AMBULATORY_CARE_PROVIDER_SITE_OTHER): Payer: BC Managed Care – PPO | Admitting: Family Medicine

## 2013-07-01 VITALS — BP 119/81 | HR 88 | Ht 66.5 in | Wt 238.0 lb

## 2013-07-01 DIAGNOSIS — G5600 Carpal tunnel syndrome, unspecified upper limb: Secondary | ICD-10-CM

## 2013-07-01 DIAGNOSIS — G5603 Carpal tunnel syndrome, bilateral upper limbs: Secondary | ICD-10-CM

## 2013-07-01 DIAGNOSIS — E559 Vitamin D deficiency, unspecified: Secondary | ICD-10-CM

## 2013-07-01 DIAGNOSIS — I10 Essential (primary) hypertension: Secondary | ICD-10-CM

## 2013-07-01 MED ORDER — NAPROXEN 500 MG PO TABS: 500.0000 mg | ORAL_TABLET | Freq: Two times a day (BID) | ORAL | Status: DC | PRN

## 2013-07-01 MED ORDER — HYDROCHLOROTHIAZIDE 25 MG PO TABS: ORAL_TABLET | ORAL | Status: DC

## 2013-07-01 NOTE — Progress Notes (Addendum)
Janet Woods is a 57 y.o. female who presents today for Vit D recheck.  Pt last Vit D on 4/30 was 15.  She was started on Vit D 2, 50,000 U capsule 1 x per week and instructed to take it for 12 weeks with check today.  She denies any increased RF for osteoporosis including smoking, low weight, alcohol use.  She did fill her Rx for 4 weeks and only took it for weeks.    Past Medical History  Diagnosis Date  . Hypertension     History  Smoking status  . Never Smoker   Smokeless tobacco  . Never Used    Family History  Problem Relation Age of Onset  . Heart disease Mother 4    MI  . Cancer Sister 42    lung cancer  . Stroke Maternal Grandmother   . Diabetes Neg Hx   . Heart disease Maternal Grandfather     Current Outpatient Prescriptions on File Prior to Visit  Medication Sig Dispense Refill  . ergocalciferol (VITAMIN D2) 50000 UNITS capsule Take 1 capsule (50,000 Units total) by mouth once a week.  4 capsule  2  . naproxen (NAPROSYN) 500 MG tablet Take 1 tablet (500 mg total) by mouth 2 (two) times daily as needed.  30 tablet  5   No current facility-administered medications on file prior to visit.    ROS: Per HPI.  All other systems reviewed and are negative.   Physical Exam Filed Vitals:   07/01/13 1032  BP: 119/81  Pulse: 88    Physical Examination: General appearance - alert, well appearing, and in no distress Neck - no JVD Chest - clear to auscultation, no wheezes, rales or rhonchi, symmetric air entry Heart - normal rate, regular rhythm, normal S1, S2, no murmurs, rubs, clicks or gallops  Vitamin D, 25 OH - 03/16/13 - 15    Addendum: Pt Vitamin D from 07/01/13 was 28.  Instructed pt to take 600 to 800 IU per day, pt understood and agreeable to this.  [name] [name], DO of [facility] 07/04/2013, 10:34 AM

## 2013-07-01 NOTE — Patient Instructions (Signed)
Janet Woods, it was nice seeing you today.  We will check a Vitamin D level on you today and if it is still low (<20) we will continue with the 50,000 U.  If it is nml, we will use 600 U ID per day.  Thank you, Dr. [name]

## 2013-07-01 NOTE — Assessment & Plan Note (Signed)
Pt last Vit D on 4/30 was 15.  She was started on Vit D 2, 50,000 U capsule 1 x per week and instructed to take it for 12 weeks with check today.  She denies any increased RF for osteoporosis including smoking, low weight, alcohol use.  She did fill her Rx for 4 weeks and only took it for weeks.  We will recheck today and if she is < 20 again, consider re start 50,000 U x 12 weeks but if > 20-30 would consider 600-800 IU per day with periodic checks.

## 2013-07-06 ENCOUNTER — Ambulatory Visit: Payer: BC Managed Care – PPO

## 2013-07-15 ENCOUNTER — Ambulatory Visit: Payer: BC Managed Care – PPO

## 2013-08-19 ENCOUNTER — Ambulatory Visit
Admission: RE | Admit: 2013-08-19 | Discharge: 2013-08-19 | Disposition: A | Discharge status: Home / Self Care | Payer: BC Managed Care – PPO | Source: Ambulatory Visit | Attending: Family Medicine | Admitting: Family Medicine

## 2013-08-19 DIAGNOSIS — Z1231 Encounter for screening mammogram for malignant neoplasm of breast: Secondary | ICD-10-CM

## 2013-08-22 ENCOUNTER — Telehealth: Payer: Self-pay | Admitting: Family Medicine

## 2013-08-22 NOTE — Telephone Encounter (Signed)
Pt would like a referral to a ear nose throat dr Fredna Dow a bug flew up her nose and she cannot get it out Please advse

## 2013-08-22 NOTE — Telephone Encounter (Signed)
Please advise. Janet Woods  

## 2013-08-23 NOTE — Telephone Encounter (Signed)
W/O an evaluation from someone in the office, we can no refer her to an ENT for "bug in nose".  Thanks, [name]. Elin Fenley, DO of [facility] 08/23/2013, 1:32 PM

## 2013-08-24 NOTE — Telephone Encounter (Signed)
Left message on patient's voicemail.Janet Woods S  

## 2013-09-01 ENCOUNTER — Ambulatory Visit (INDEPENDENT_AMBULATORY_CARE_PROVIDER_SITE_OTHER): Payer: BC Managed Care – PPO | Admitting: Family Medicine

## 2013-09-01 ENCOUNTER — Encounter: Payer: Self-pay | Admitting: Family Medicine

## 2013-09-01 VITALS — BP 136/87 | HR 61 | Ht 66.5 in | Wt 236.0 lb

## 2013-09-01 DIAGNOSIS — J343 Hypertrophy of nasal turbinates: Secondary | ICD-10-CM | POA: Insufficient documentation

## 2013-09-01 MED ORDER — MOMETASONE FUROATE 50 MCG/ACT NA SUSP: 2.0000 | Freq: Every day | NASAL | Status: DC

## 2013-09-01 NOTE — Progress Notes (Signed)
Janet Woods is a 57 y.o. female who presents today for nasal congestion, possible foreign body in nose.  Pt states she had a bug fly into her left nare about one month ago now.  Pt still believes there is something in there, and has not tried anything to alleviate the situation.  Denies any epistaxis, rhinorrhea, pain, edema, fever, sinus pressure/pain.     Past Medical History  Diagnosis Date  . Hypertension     History  Smoking status  . Never Smoker   Smokeless tobacco  . Never Used    Family History  Problem Relation Age of Onset  . Heart disease Mother 20    MI  . Cancer Sister 69    lung cancer  . Stroke Maternal Grandmother   . Diabetes Neg Hx   . Heart disease Maternal Grandfather     Current Outpatient Prescriptions on File Prior to Visit  Medication Sig Dispense Refill  . ergocalciferol (VITAMIN D2) 50000 UNITS capsule Take 1 capsule (50,000 Units total) by mouth once a week.  4 capsule  2  . hydrochlorothiazide (HYDRODIURIL) 25 MG tablet take 1 tablet by mouth once daily  90 tablet  3  . naproxen (NAPROSYN) 500 MG tablet Take 1 tablet (500 mg total) by mouth 2 (two) times daily as needed.  30 tablet  5   No current facility-administered medications on file prior to visit.    ROS: Per HPI.  All other systems reviewed and are negative.   Physical Exam Filed Vitals:   09/01/13 0915  BP: 136/87  Pulse: 61    Physical Examination: General appearance - alert, well appearing, and in no distress Mental status - alert, oriented to person, place, and time Eyes - left eye normal, right eye normal Ears - bilateral TM's and external ear canals normal Nose - normal nontender sinuses, mucosal congestion and mucosal erythema Mouth - mucous membranes moist, pharynx normal without lesions

## 2013-09-01 NOTE — Assessment & Plan Note (Signed)
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  She denies any pruritis, sneezing, rhinitis at this time.  Will tx with nasonex and see back in one month.  Pt is concerned that a bug flew up her nose, about one month ago, and it is still in there.  Reassured her that there was not a bug in her nose and no remnants seen suggesting this as well.  I believe her nasal congestion at this time is related to boggy turbinates.

## 2013-09-01 NOTE — Patient Instructions (Signed)
Joeanna, it was nice seeing you today.  You have some inflammation in your nose, and the medication we are giving you will help calm down some of this.  Thanks, Dr. [name]

## 2014-01-23 ENCOUNTER — Ambulatory Visit (INDEPENDENT_AMBULATORY_CARE_PROVIDER_SITE_OTHER): Payer: BC Managed Care – PPO | Admitting: Family Medicine

## 2014-01-23 ENCOUNTER — Encounter: Payer: Self-pay | Admitting: Family Medicine

## 2014-01-23 VITALS — BP 149/78 | HR 78 | Temp 98.5°F | Ht 66.5 in | Wt 243.5 lb

## 2014-01-23 DIAGNOSIS — R413 Other amnesia: Secondary | ICD-10-CM | POA: Insufficient documentation

## 2014-01-23 DIAGNOSIS — E669 Obesity, unspecified: Secondary | ICD-10-CM

## 2014-01-23 DIAGNOSIS — Z13220 Encounter for screening for lipoid disorders: Secondary | ICD-10-CM | POA: Insufficient documentation

## 2014-01-23 NOTE — Assessment & Plan Note (Signed)
A1C and lipid panel.

## 2014-01-23 NOTE — Addendum Note (Signed)
Addended by: Enid Skeens K on: 01/23/2014 11:39 AM   Modules accepted: Orders

## 2014-01-23 NOTE — Assessment & Plan Note (Signed)
Discussed repeat Lipid Panel today and possible need for statin therapy in near future.  Pt does not want to go on statin therapy and will consider re-evaluating in 6-12 months.

## 2014-01-23 NOTE — Progress Notes (Signed)
Janet Woods is a 58 y.o. female who presents today for insidious memory loss for the past 18 months.  Pt states she first started noticing forgetfullness about 1.5 yrs ago when she had trouble recalling where she had left her keys almost everyday.  As well, she has had decreased ability to remember where she has placed objects in her house, where she has parked her car when going to the grocery store.  Pt states that one or two family members have noticed a decrease in her ability to recall things in her daily life.  However, pt is able to function with her normal ADLs, denies recent medication intake or changes in diet, new sexual partners, or travel.   Past Medical History  Diagnosis Date  . Hypertension     History  Smoking status  . Never Smoker   Smokeless tobacco  . Never Used    Family History  Problem Relation Age of Onset  . Heart disease Mother 51    MI  . Cancer Sister 13    lung cancer  . Stroke Maternal Grandmother   . Diabetes Neg Hx   . Heart disease Maternal Grandfather     Current Outpatient Prescriptions on File Prior to Visit  Medication Sig Dispense Refill  . ergocalciferol (VITAMIN D2) 50000 UNITS capsule Take 1 capsule (50,000 Units total) by mouth once a week.  4 capsule  2  . hydrochlorothiazide (HYDRODIURIL) 25 MG tablet take 1 tablet by mouth once daily  90 tablet  3  . mometasone (NASONEX) 50 MCG/ACT nasal spray Place 2 sprays into the nose daily.  17 g  12  . naproxen (NAPROSYN) 500 MG tablet Take 1 tablet (500 mg total) by mouth 2 (two) times daily as needed.  30 tablet  5   No current facility-administered medications on file prior to visit.    ROS: Per HPI.  All other systems reviewed and are negative.   Physical Exam Filed Vitals:   01/23/14 1028  BP: 149/78  Pulse: 78  Temp: 98.5 F (36.9 C)    Physical Examination: General appearance - alert, well appearing, and in no distress Mental status - alert, oriented to person, place, and  time Chest - clear to auscultation, no wheezes, rales or rhonchi, symmetric air entry Heart - normal rate and regular rhythm, no murmurs noted

## 2014-01-23 NOTE — Patient Instructions (Signed)
Please come back for you lab tests in the morning when you have not eaten anything for at least 8 hours.  We will see you back in about 6 weeks.  If you have any questions, please call.  Thanks, Dr. Awanda Mink

## 2014-01-23 NOTE — Assessment & Plan Note (Addendum)
Discussed w/u of this and pt amenable to completing the course at this time.  Will order CMET, CBC, B12, TSH, RPR, HIV, and CT w/o contrast.  Will need corroboration of family members that this has been insidious/affecting daily functioning/short term anterograde amnesia to formally dx with alzheimers if her w/u comes back negative.  Will see back in 1.5 months.

## 2014-02-13 ENCOUNTER — Other Ambulatory Visit (INDEPENDENT_AMBULATORY_CARE_PROVIDER_SITE_OTHER): Payer: BC Managed Care – PPO

## 2014-02-13 DIAGNOSIS — Z13220 Encounter for screening for lipoid disorders: Secondary | ICD-10-CM

## 2014-02-13 DIAGNOSIS — R413 Other amnesia: Secondary | ICD-10-CM

## 2014-02-13 LAB — COMPREHENSIVE METABOLIC PANEL
ALBUMIN: 3.8 g/dL (ref 3.5–5.2)
ALT: 17 U/L (ref 0–35)
AST: 19 U/L (ref 0–37)
Alkaline Phosphatase: 53 U/L (ref 39–117)
BUN: 10 mg/dL (ref 6–23)
CALCIUM: 9 mg/dL (ref 8.4–10.5)
CO2: 31 mEq/L (ref 19–32)
CREATININE: 0.73 mg/dL (ref 0.50–1.10)
Chloride: 105 mEq/L (ref 96–112)
GLUCOSE: 98 mg/dL (ref 70–99)
POTASSIUM: 3.6 meq/L (ref 3.5–5.3)
Sodium: 143 mEq/L (ref 135–145)
Total Bilirubin: 0.3 mg/dL (ref 0.2–1.2)
Total Protein: 7.4 g/dL (ref 6.0–8.3)

## 2014-02-13 LAB — CBC
HEMATOCRIT: 39.4 % (ref 36.0–46.0)
HEMOGLOBIN: 13.1 g/dL (ref 12.0–15.0)
MCH: 27.8 pg (ref 26.0–34.0)
MCHC: 33.2 g/dL (ref 30.0–36.0)
MCV: 83.5 fL (ref 78.0–100.0)
Platelets: 337 10*3/uL (ref 150–400)
RBC: 4.72 MIL/uL (ref 3.87–5.11)
RDW: 15 % (ref 11.5–15.5)
WBC: 8.6 10*3/uL (ref 4.0–10.5)

## 2014-02-13 LAB — VITAMIN B12: VITAMIN B 12: 551 pg/mL (ref 211–911)

## 2014-02-13 LAB — TSH: TSH: 1.325 u[IU]/mL (ref 0.350–4.500)

## 2014-02-13 LAB — POCT GLYCOSYLATED HEMOGLOBIN (HGB A1C): Hemoglobin A1C: 6

## 2014-02-13 LAB — LIPID PANEL
CHOLESTEROL: 150 mg/dL (ref 0–200)
HDL: 49 mg/dL (ref >39)
LDL Cholesterol: 80 mg/dL (ref 0–99)
TRIGLYCERIDES: 103 mg/dL (ref <150)
Total CHOL/HDL Ratio: 3.1 Ratio
VLDL: 21 mg/dL (ref 0–40)

## 2014-02-13 NOTE — Progress Notes (Signed)
A1C,FLP,HIV,TSH,CMP,CBC,B12 AND RPR DONE TODAY Janet Woods

## 2014-02-14 LAB — HIV ANTIBODY (ROUTINE TESTING W REFLEX): HIV: NONREACTIVE

## 2014-02-14 LAB — RPR

## 2014-04-05 ENCOUNTER — Encounter: Payer: Self-pay | Admitting: Family Medicine

## 2014-04-05 ENCOUNTER — Telehealth: Payer: Self-pay | Admitting: *Deleted

## 2014-04-05 NOTE — Progress Notes (Signed)
Please give pt information about [facility] or [place], we do not refer for this.  Thanks, Tamela Oddi. Awanda Mink, DO of [facility] 04/05/2014, 9:12 AM

## 2014-04-05 NOTE — Progress Notes (Unsigned)
Patient said she got a letter from her insurance company saying that it is time for her to get a colonoscopy.  Please call her regarding this.

## 2014-04-05 NOTE — Telephone Encounter (Signed)
Patient informed that we usually give a sheet during office visit for  GI doctor.She states she  misplaced it and  Request a new copy to be mailed today. her.copy was mailed today.Gamaliel

## 2014-04-21 ENCOUNTER — Encounter: Payer: Self-pay | Admitting: Family Medicine

## 2014-04-21 ENCOUNTER — Ambulatory Visit (INDEPENDENT_AMBULATORY_CARE_PROVIDER_SITE_OTHER): Payer: BC Managed Care – PPO | Admitting: Family Medicine

## 2014-04-21 ENCOUNTER — Other Ambulatory Visit: Payer: Self-pay | Admitting: Family Medicine

## 2014-04-21 VITALS — BP 146/95 | HR 95 | Temp 98.0°F | Ht 66.5 in | Wt 243.0 lb

## 2014-04-21 DIAGNOSIS — G5603 Carpal tunnel syndrome, bilateral upper limbs: Secondary | ICD-10-CM

## 2014-04-21 DIAGNOSIS — G5600 Carpal tunnel syndrome, unspecified upper limb: Secondary | ICD-10-CM

## 2014-04-21 DIAGNOSIS — I10 Essential (primary) hypertension: Secondary | ICD-10-CM

## 2014-04-21 MED ORDER — NAPROXEN 500 MG PO TABS: 500.0000 mg | ORAL_TABLET | Freq: Two times a day (BID) | ORAL | Status: DC | PRN

## 2014-04-21 MED ORDER — HYDROCHLOROTHIAZIDE 25 MG PO TABS: ORAL_TABLET | ORAL | Status: DC

## 2014-04-21 NOTE — Patient Instructions (Signed)
Carpal Tunnel Release Carpal tunnel release is done to relieve the pressure on the nerves and tendons on the bottom side of your wrist.  LET YOUR CAREGIVER KNOW ABOUT:   Allergies to food or medicine.  Medicines taken, including vitamins, herbs, eyedrops, over-the-counter medicines, and creams.  Use of steroids (by mouth or creams).  Previous problems with anesthetics or numbing medicines.  History of bleeding problems or blood clots.  Previous surgery.  Other health problems, including diabetes and kidney problems.  Possibility of pregnancy, if this applies. RISKS AND COMPLICATIONS  Some problems that may happen after this procedure include:  Infection.  Damage to the nerves, arteries or tendons could occur. This would be very uncommon.  Bleeding. BEFORE THE PROCEDURE   This surgery may be done while you are asleep (general anesthetic) or may be done under a block where only your forearm and the surgical area is numb.  If the surgery is done under a block, the numbness will gradually wear off within several hours after surgery. HOME CARE INSTRUCTIONS   Have a responsible person with you for 24 hours.  Do not drive a car or use public transportation for 24 hours.  Only take over-the-counter or prescription medicines for pain, discomfort, or fever as directed by your caregiver. Take them as directed.  You may put ice on the palm side of the affected wrist.  Put ice in a plastic bag.  Place a towel between your skin and the bag.  Leave the ice on for 20 to 30 minutes, 4 times per day.  If you were given a splint to keep your wrist from bending, use it as directed. It is important to wear the splint at night or as directed. Use the splint for as long as you have pain or numbness in your hand, arm, or wrist. This may take 1 to 2 months.  Keep your hand raised (elevated) above the level of your heart as much as possible. This keeps swelling down and helps with  discomfort.  Change bandages (dressings) as directed.  Keep the wound clean and dry. SEEK MEDICAL CARE IF:   You develop pain not relieved with medications.  You develop numbness of your hand.  You develop bleeding from your surgical site.  You have an oral temperature above 102 F (38.9 C).  You develop redness or swelling of the surgical site.  You develop new, unexplained problems. SEEK IMMEDIATE MEDICAL CARE IF:   You develop a rash.  You have difficulty breathing.  You develop any reaction or side effects to medications given. Document Released: 01/24/2004 Document Revised: 01/26/2012 Document Reviewed: 09/09/2007 Exit[employer] Patient Information 2014 [employer].  
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
## 2014-04-24 NOTE — Assessment & Plan Note (Signed)
I am not certain why she came in today. She declines all further management (surgical referral, injections etc). In addition she is not wearing her wrist splints. She became quite upset with even the discussion of her possible choices to help with her CT (stretches, wrist splints). Refill for naproxen was given. At the end of appointment she became belligerent and told me to "mind my own business." I suspect this was because I would not fill other prescription refills for on her same day appointment for CT, and asked her to call her  Pharmacy or PCP, so they could review for appropriateness.   F/u with PCP PRN

## 2014-04-27 ENCOUNTER — Telehealth: Payer: Self-pay | Admitting: Family Medicine

## 2014-04-27 NOTE — Telephone Encounter (Signed)
Entered in error  [place] R. Awanda Mink, DO of [facility] 04/27/2014, 2:06 PM

## 2014-05-14 ENCOUNTER — Emergency Department (HOSPITAL_COMMUNITY): Payer: BC Managed Care – PPO

## 2014-05-14 ENCOUNTER — Emergency Department (HOSPITAL_COMMUNITY)
Admission: EM | Admit: 2014-05-14 | Discharge: 2014-05-14 | Disposition: A | Discharge status: Home / Self Care | Payer: BC Managed Care – PPO | Attending: Emergency Medicine | Admitting: Emergency Medicine

## 2014-05-14 ENCOUNTER — Encounter (HOSPITAL_COMMUNITY): Payer: Self-pay | Admitting: Emergency Medicine

## 2014-05-14 DIAGNOSIS — Z79899 Other long term (current) drug therapy: Secondary | ICD-10-CM | POA: Insufficient documentation

## 2014-05-14 DIAGNOSIS — R112 Nausea with vomiting, unspecified: Secondary | ICD-10-CM

## 2014-05-14 DIAGNOSIS — R030 Elevated blood-pressure reading, without diagnosis of hypertension: Secondary | ICD-10-CM

## 2014-05-14 DIAGNOSIS — IMO0001 Reserved for inherently not codable concepts without codable children: Secondary | ICD-10-CM

## 2014-05-14 DIAGNOSIS — R0602 Shortness of breath: Secondary | ICD-10-CM | POA: Insufficient documentation

## 2014-05-14 DIAGNOSIS — R000 Tachycardia, unspecified: Secondary | ICD-10-CM | POA: Insufficient documentation

## 2014-05-14 DIAGNOSIS — I10 Essential (primary) hypertension: Secondary | ICD-10-CM | POA: Insufficient documentation

## 2014-05-14 DIAGNOSIS — R5383 Other fatigue: Secondary | ICD-10-CM

## 2014-05-14 DIAGNOSIS — R5381 Other malaise: Secondary | ICD-10-CM | POA: Insufficient documentation

## 2014-05-14 DIAGNOSIS — Z8669 Personal history of other diseases of the nervous system and sense organs: Secondary | ICD-10-CM | POA: Insufficient documentation

## 2014-05-14 HISTORY — DX: Carpal tunnel syndrome, unspecified upper limb: G56.00

## 2014-05-14 LAB — CBC WITH DIFFERENTIAL/PLATELET
Basophils Absolute: 0 10*3/uL (ref 0.0–0.1)
Basophils Relative: 0 % (ref 0–1)
Eosinophils Absolute: 0.1 10*3/uL (ref 0.0–0.7)
Eosinophils Relative: 1 % (ref 0–5)
HCT: 41.5 % (ref 36.0–46.0)
Hemoglobin: 14 g/dL (ref 12.0–15.0)
Lymphocytes Relative: 11 % — ABNORMAL LOW (ref 12–46)
Lymphs Abs: 1.2 10*3/uL (ref 0.7–4.0)
MCH: 28.3 pg (ref 26.0–34.0)
MCHC: 33.7 g/dL (ref 30.0–36.0)
MCV: 84 fL (ref 78.0–100.0)
Monocytes Absolute: 0.7 10*3/uL (ref 0.1–1.0)
Monocytes Relative: 6 % (ref 3–12)
Neutro Abs: 8.7 10*3/uL — ABNORMAL HIGH (ref 1.7–7.7)
Neutrophils Relative %: 82 % — ABNORMAL HIGH (ref 43–77)
Platelets: 316 10*3/uL (ref 150–400)
RBC: 4.94 MIL/uL (ref 3.87–5.11)
RDW: 14.1 % (ref 11.5–15.5)
WBC: 10.7 10*3/uL — ABNORMAL HIGH (ref 4.0–10.5)

## 2014-05-14 LAB — BASIC METABOLIC PANEL
BUN: 10 mg/dL (ref 6–23)
CO2: 27 mEq/L (ref 19–32)
Calcium: 9.4 mg/dL (ref 8.4–10.5)
Chloride: 100 mEq/L (ref 96–112)
Creatinine, Ser: 0.69 mg/dL (ref 0.50–1.10)
GFR calc Af Amer: >90 mL/min (ref >90)
GFR calc non Af Amer: >90 mL/min (ref >90)
Glucose, Bld: 105 mg/dL — ABNORMAL HIGH (ref 70–99)
Potassium: 3.9 mEq/L (ref 3.7–5.3)
Sodium: 143 mEq/L (ref 137–147)

## 2014-05-14 LAB — I-STAT TROPONIN, ED: Troponin i, poc: 0 ng/mL (ref 0.00–0.08)

## 2014-05-14 LAB — D-DIMER, QUANTITATIVE (NOT AT ARMC): D-Dimer, Quant: 0.51 ug/mL-FEU — ABNORMAL HIGH (ref 0.00–0.48)

## 2014-05-14 MED ORDER — IOHEXOL 350 MG/ML SOLN
100.0000 mL | Freq: Once | INTRAVENOUS | Status: AC | PRN
  Administered 2014-05-14: 100 mL via INTRAVENOUS

## 2014-05-14 MED ORDER — SODIUM CHLORIDE 0.9 % IV BOLUS (SEPSIS)
1000.0000 mL | Freq: Once | INTRAVENOUS | Status: AC
  Administered 2014-05-14: 1000 mL via INTRAVENOUS

## 2014-05-14 MED ORDER — ONDANSETRON HCL 4 MG/2ML IJ SOLN
4.0000 mg | Freq: Once | INTRAMUSCULAR | Status: AC
  Administered 2014-05-14: 4 mg via INTRAVENOUS
  Filled 2014-05-14: qty 2

## 2014-05-14 NOTE — ED Notes (Signed)
Pt presents with c/o hypertension and vomiting. Pt says that she vomited twice this morning and thought that it might be the heat but when pt checked her blood pressure she said it was very high, unsure of the exact BP. Pt says she feels nauseated and her supervisor at work told her to come here because she may be dehydrated, pt c/o dry mouth.

## 2014-05-14 NOTE — ED Provider Notes (Signed)
CSN: [id number]     Arrival date & time 05/14/14  1716 History   First MD Initiated Contact with Patient 05/14/14 1731     Chief Complaint  Patient presents with  . Emesis  . Blood Pressure Check     (Consider location/radiation/quality/duration/timing/severity/associated sxs/prior Treatment) HPI Pt is a 58yo female with hx of HTN presenting to ED with c/o nausea and 2 episodes of non-bloody, non-bilious emesis this morning. 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  Reports taking her BP earlier today and states "it was high, 100 something over 100 something"  Pt states she is on HCTZ for BP, missed her dose yesterday but took it today.  Denies hx of recent illness prior to this morning.  Denies chest pain but or SOB at this time but does report having intermittent SOB and having to sleep in a recliner at night due to SOB.  Reports strong FH of CAD. Denies smoking. Denies personal hx of CAD. Denies hx of PE, leg pain or swelling.  Denies sick contacts or recent travel.   Past Medical History  Diagnosis Date  . Hypertension   . Carpal tunnel syndrome    History reviewed. No pertinent past surgical history. Family History  Problem Relation Age of Onset  . Heart disease Mother 15    MI  . Cancer Sister 48    lung cancer  . Stroke Maternal Grandmother   . Diabetes Neg Hx   . Heart disease Maternal Grandfather    History  Substance Use Topics  . Smoking status: Never Smoker   . Smokeless tobacco: Never Used  . Alcohol Use: No   OB History   Grav Para Term Preterm Abortions TAB SAB Ect Mult Living                 Review of Systems  Constitutional: Positive for appetite change and fatigue. Negative for  fever, chills and diaphoresis.  HENT: Negative for congestion and sore throat.   Respiratory: Positive for shortness of breath ( intermittent). Negative for cough, wheezing and stridor.   Cardiovascular: Negative for chest pain, palpitations and leg swelling.  Gastrointestinal: Positive for nausea and vomiting. Negative for abdominal pain and diarrhea.  Neurological: Positive for weakness (generalized).  All other systems reviewed and are negative.     Allergies  Review of patient's allergies indicates no known allergies.  Home Medications   Prior to Admission medications   Medication Sig Start Date End Date Taking? Authorizing Cedric Mcclaine  alum & mag hydroxide-simeth (MAALOX/MYLANTA) 200-200-20 MG/5ML suspension Take 30 mLs by mouth every 6 (six) hours as needed for indigestion or heartburn.   Yes Historical Lequita Meadowcroft, MD  calcium carbonate (TUMS - DOSED IN MG ELEMENTAL CALCIUM) 500 MG chewable tablet Chew 1 tablet by mouth 3 (three) times daily as needed for indigestion or heartburn.   Yes Historical Lundon Rosier, MD  hydrochlorothiazide (HYDRODIURIL) 25 MG tablet Take 25 mg by mouth every morning.   Yes Historical Hayle Parisi, MD  naproxen (NAPROSYN) 500 MG tablet Take 500 mg by mouth 2 (two) times daily as needed for mild pain.   Yes Historical Freya Zobrist, MD   BP 145/92  Pulse 94  Temp(Src)  98.2 F (36.8 C) (Oral)  Resp 18  SpO2 98% Physical Exam  Nursing note and vitals reviewed. Constitutional: She appears well-developed and well-nourished. No distress.  Morbidly obese female sitting on edge of exam bed, NAD.  HENT:  Head: Normocephalic and atraumatic.  Mouth/Throat: Oropharynx is clear and moist.  Eyes: Conjunctivae are normal. No scleral icterus.  Neck: Normal range of motion. Neck supple.  Cardiovascular: Regular rhythm and normal heart sounds.  Tachycardia present.   Mildly tachycardic, regular rhythm  Pulmonary/Chest: Effort normal and breath sounds normal. No respiratory  distress. She has no wheezes. She has no rales. She exhibits no tenderness.  No respiratory distress, able to speak in full sentences w/o difficulty. Lungs: CTAB  Abdominal: Soft. Bowel sounds are normal. She exhibits no distension and no mass. There is no tenderness. There is no rebound and no guarding.  Musculoskeletal: Normal range of motion.  Neurological: She is alert.  Skin: Skin is warm and dry. She is not diaphoretic.    ED Course  Procedures (including critical care time) Labs Review Labs Reviewed  CBC WITH DIFFERENTIAL - Abnormal; Notable for the following:    WBC 10.7 (*)    Neutrophils Relative % 82 (*)    Neutro Abs 8.7 (*)    Lymphocytes Relative 11 (*)    All other components within normal limits  BASIC METABOLIC PANEL - Abnormal; Notable for the following:    Glucose, Bld 105 (*)    All other components within normal limits  D-DIMER, QUANTITATIVE - Abnormal; Notable for the following:    D-Dimer, Quant 0.51 (*)    All other components within normal limits  I-STAT TROPOININ, ED    Imaging Review Dg Chest 2 View  05/14/2014   CLINICAL DATA:  Chest pain, shortness of Breath  EXAM: CHEST  2 VIEW  COMPARISON:  None.  FINDINGS: Cardiomediastinal silhouette is unremarkable no acute infiltrate or pleural effusion. No pulmonary edema. Osteopenia and mild degenerative changes thoracic spine. Mild lower thoracic dextroscoliosis.  IMPRESSION: No active cardiopulmonary disease.   Electronically Signed   By: Lahoma Crocker M.D.   On: 05/14/2014 18:44   Ct Angio Chest Pe W/cm &/or Wo Cm  05/14/2014   CLINICAL DATA:  Shortness of breath and tachycardia. Elevated D-dimer  EXAM: CT ANGIOGRAPHY CHEST WITH CONTRAST  TECHNIQUE: Multidetector CT imaging of the chest was performed using the standard protocol during bolus administration of intravenous contrast. Multiplanar CT image reconstructions and MIPs were obtained to evaluate the vascular anatomy.  CONTRAST:  1100mL OMNIPAQUE IOHEXOL 350  MG/ML SOLN  COMPARISON:  None.  FINDINGS: Exam detail is diminished due to respiratory motion artifact and suboptimal pulmonary arterial opacification. Main pulmonary artery appears normal. No central filling defect identified to suggest saddle embolus. No abnormal filling defect within the lobar or segmental pulmonary arteries to suggest a clinically significant acute pulmonary embolus. There is no pericardial effusion. No enlarged mediastinal or hilar lymph nodes. No axillary or supraclavicular adenopathy.  No airspace consolidation or atelectasis. No nodule or mass identified.  Incidental imaging through the upper abdomen is on unremarkable.  Review of the visualized osseous structures is significant for mild scoliosis and degenerative disc disease.  Review of the MIP images confirms the above findings.  IMPRESSION: 1. Exam detail is diminished due to respiratory motion artifact and suboptimal pulmonary arterial opacification. Within this limitation there is no evidence for acute pulmonary embolus.   Electronically Signed   By: Kerby Moors M.D.   On: 05/14/2014  20:10     EKG Interpretation   Date/Time:  Sunday May 14 2014 17:55:27 EDT Ventricular Rate:  101 PR Interval:  162 QRS Duration: 86 QT Interval:  344 QTC Calculation: 446 R Axis:   5 Text Interpretation:  Sinus tachycardia No significant change since last  tracing Confirmed by Winfred Leeds  MD, SAM [phone]) on 05/14/2014 7:01:52 PM      MDM   Final diagnoses:  Nausea and vomiting, vomiting of unspecified type  Elevated blood pressure    Pt is a 58yo morbidly obese female presenting to ED c/o nausea, vomiting and elevated BP earlier today.  Supervisor advised pt to come to ED for possible dehydration. Pt also reports intermittent orthopnea. Denies chest pain or leg swelling.  Pt is tachycardic 112 in triage.  Due to age, PERC score cannot be used.  Symptoms likely due to mild dehydration and gastroenteritis, however due to risk  factors will get cardiac workup for vague "not feeling well," with intermittent SOB and tachycardia.  D-dimer, CXR, istat troponin, CBC, and BMP ordered.      CBC-mildly elevated WBC-10.68m BMP: unremarkable, Troponin-negative, D-dimer mildly elevated-0.51 CXR: no active cardiopulmonary disease.   CT angio chest: no evidence of acute PE  Pt given fluid challenge.  Discussed pt with Dr. Winfred Leeds who also examined pt.  Pt may be discharged home and advised to f/u with PCP within 2 weeks for BP recheck. Return precautions provided. Pt verbalized understanding and agreement with tx plan.     Noland Fordyce, PA-C 05/14/14 2221

## 2014-05-14 NOTE — ED Provider Notes (Signed)
Patient vomited twice this  morning at 10 AM. She noticed her blood pressure be approximately 169 systolic. She took her blood pressure medication late this morning. She denies having had any chest pain. Admits to mild shortness of breath no chronic and unchanged. She is presently asymptomatic since treatment in the emergency department. On exam no distress lungs clear auscultation heart regular in rhythm abdomen obese nontender neurologic Glasgow Coma Score 15 gait normal moves all extremity is well. Patient encouraged to take her blood pressure medications as prescribed. She is to followup with her primary care physician, call family practice for blood pressure recheck in one or 2 weeks  Orlie Dakin, MD 05/14/14 2045

## 2014-05-14 NOTE — Discharge Instructions (Signed)
Hypertension Hypertension is another name for high blood pressure. High blood pressure forces your heart to work harder to pump blood. A blood pressure reading has two numbers, which includes a higher number over a lower number (example: 110/72). HOME CARE   Have your blood pressure rechecked by your doctor.  Only take medicine as told by your doctor. Follow the directions carefully. The medicine does not work as well if you skip doses. Skipping doses also puts you at risk for problems.  Do not smoke.  Monitor your blood pressure at home as told by your doctor. GET HELP IF:  You think you are having a reaction to the medicine you are taking.  You have repeat headaches or feel dizzy.  You have puffiness (swelling) in your ankles.  You have trouble with your vision. GET HELP RIGHT AWAY IF:   You get a very bad headache and are confused.  You feel weak, numb, or faint.  You get chest or belly (abdominal) pain.  You throw up (vomit).  You cannot breathe very well. MAKE SURE YOU:   Understand these instructions.  Will watch your condition.  Will get help right away if you are not doing well or get worse. Document Released: 04/21/2008 Document Revised: 11/08/2013 Document Reviewed: 08/26/2013 Exit[employer] Patient Information 2015 [employer]. This information is not intended to replace advice given to you by your health care provider. Make sure you discuss any questions you have with your health care provider.  

## 2014-05-14 NOTE — ED Notes (Signed)
Patient transported to X-ray 

## 2014-05-15 NOTE — ED Provider Notes (Signed)
Medical screening examination/treatment/procedure(s) were conducted as a shared visit with non-physician practitioner(s) and myself.  I personally evaluated the patient during the encounter.   EKG Interpretation   Date/Time:  Sunday May 14 2014 17:55:27 EDT Ventricular Rate:  101 PR Interval:  162 QRS Duration: 86 QT Interval:  344 QTC Calculation: 446 R Axis:   5 Text Interpretation:  Sinus tachycardia No significant change since last  tracing Confirmed by Winfred Leeds  MD, Zadaya Cuadra [phone]) on 05/14/2014 7:01:52 PM       Orlie Dakin, MD 05/15/14 0030

## 2014-06-21 ENCOUNTER — Encounter: Payer: Self-pay | Admitting: *Deleted

## 2014-06-23 ENCOUNTER — Other Ambulatory Visit: Payer: Self-pay

## 2014-06-23 ENCOUNTER — Encounter: Payer: Self-pay | Admitting: *Deleted

## 2014-06-23 DIAGNOSIS — Z1231 Encounter for screening mammogram for malignant neoplasm of breast: Secondary | ICD-10-CM

## 2014-06-29 ENCOUNTER — Encounter: Payer: Self-pay | Admitting: *Deleted

## 2014-07-19 ENCOUNTER — Encounter: Payer: Self-pay | Admitting: Family Medicine

## 2014-07-19 ENCOUNTER — Ambulatory Visit (INDEPENDENT_AMBULATORY_CARE_PROVIDER_SITE_OTHER): Payer: BC Managed Care – PPO | Admitting: Family Medicine

## 2014-07-19 VITALS — BP 148/89 | HR 99 | Temp 98.4°F | Wt 243.0 lb

## 2014-07-19 DIAGNOSIS — I10 Essential (primary) hypertension: Secondary | ICD-10-CM | POA: Diagnosis not present

## 2014-07-19 NOTE — Patient Instructions (Signed)
Influenza Vaccine (Flu Vaccine, Inactivated or Recombinant) 2014-2015: What You Need to Know 1. Why get vaccinated? Influenza ("flu") is a contagious disease that spreads around the [place] every winter, usually between October and May. Flu is caused by influenza viruses, and is spread mainly by coughing, sneezing, and close contact. Anyone can get flu, but the risk of getting flu is highest among children. Symptoms come on suddenly and may last several days. They can include:  fever/chills  sore throat  muscle aches  fatigue  cough  headache  runny or stuffy nose Flu can make some people much sicker than others. These people include young children, people 65 and older, pregnant women, and people with certain health conditions-such as heart, lung or kidney disease, nervous system disorders, or a weakened immune system. Flu vaccination is especially important for these people, and anyone in close contact with them. Flu can also lead to pneumonia, and make existing medical conditions worse. It can cause diarrhea and seizures in children. Each year thousands of people in the [place] die from flu, and many more are hospitalized. Flu vaccine is the best protection against flu and its complications. Flu vaccine also helps prevent spreading flu from person to person. 2. Inactivated and recombinant flu vaccines You are getting an injectable flu vaccine, which is either an "inactivated" or "recombinant" vaccine. These vaccines do not contain any live influenza virus. They are given by injection with a needle, and often called the "flu shot."  A different live, attenuated (weakened) influenza vaccine is sprayed into the nostrils. This vaccine is described in a separate Vaccine Information Statement. Flu vaccination is recommended every year. Some children 6 months through 8 years of age might need two doses during one year. Flu viruses are always changing. Each year's flu vaccine is made  to protect against 3 or 4 viruses that are likely to cause disease that year. Flu vaccine cannot prevent all cases of flu, but it is the best defense against the disease.  It takes about 2 weeks for protection to develop after the vaccination, and protection lasts several months to a year. Some illnesses that are not caused by influenza virus are often mistaken for flu. Flu vaccine will not prevent these illnesses. It can only prevent influenza. Some inactivated flu vaccine contains a very small amount of a mercury-based preservative called thimerosal. Studies have shown that thimerosal in vaccines is not harmful, but flu vaccines that do not contain a preservative are available. 3. Some people should not get this vaccine Tell the person who gives you the vaccine:  If you have any severe, life-threatening allergies. If you ever had a life-threatening allergic reaction after a dose of flu vaccine, or have a severe allergy to any part of this vaccine, including (for example) an allergy to gelatin, antibiotics, or eggs, you may be advised not to get vaccinated. Most, but not all, types of flu vaccine contain a small amount of egg protein.  If you ever had Guillain-Barr Syndrome (a severe paralyzing illness, also called GBS). Some people with a history of GBS should not get this vaccine. This should be discussed with your doctor.  If you are not feeling well. It is usually okay to get flu vaccine when you have a mild illness, but you might be advised to wait until you feel better. You should come back when you are better. 4. Risks of a vaccine reaction With a vaccine, like any medicine, there is a chance of side   effects. These are usually mild and go away on their own. Problems that could happen after any vaccine:  Brief fainting spells can happen after any medical procedure, including vaccination. Sitting or lying down for about 15 minutes can help prevent fainting, and injuries caused by a fall. Tell  your doctor if you feel dizzy, or have vision changes or ringing in the ears.  Severe shoulder pain and reduced range of motion in the arm where a shot was given can happen, very rarely, after a vaccination.  Severe allergic reactions from a vaccine are very rare, estimated at less than 1 in a million doses. If one were to occur, it would usually be within a few minutes to a few hours after the vaccination. Mild problems following inactivated flu vaccine:  soreness, redness, or swelling where the shot was given  hoarseness  sore, red or itchy eyes  cough  fever  aches  headache  itching  fatigue If these problems occur, they usually begin soon after the shot and last 1 or 2 days. Moderate problems following inactivated flu vaccine:  Young children who get inactivated flu vaccine and pneumococcal vaccine (PCV13) at the same time may be at increased risk for seizures caused by fever. Ask your doctor for more information. Tell your doctor if a child who is getting flu vaccine has ever had a seizure. Inactivated flu vaccine does not contain live flu virus, so you cannot get the flu from this vaccine. As with any medicine, there is a very remote chance of a vaccine causing a serious injury or death. The safety of vaccines is always being monitored. For more information, visit: www.cdc.gov/vaccinesafety/ 5. What if there is a serious reaction? What should I look for?  Look for anything that concerns you, such as signs of a severe allergic reaction, very high fever, or behavior changes. Signs of a severe allergic reaction can include hives, swelling of the face and throat, difficulty breathing, a fast heartbeat, dizziness, and weakness. These would start a few minutes to a few hours after the vaccination. What should I do?  If you think it is a severe allergic reaction or other emergency that can't wait, call 9-1-1 and get the person to the nearest hospital. Otherwise, call your  doctor.  Afterward, the reaction should be reported to the Vaccine Adverse Event Reporting System (VAERS). Your doctor should file this report, or you can do it yourself through the VAERS web site at www.vaers.hhs.gov, or by calling 1-800-822-7967. VAERS does not give medical advice. 6. The National Vaccine Injury Compensation Program The National Vaccine Injury Compensation Program (VICP) is a federal program that was created to compensate people who may have been injured by certain vaccines. Persons who believe they may have been injured by a vaccine can learn about the program and about filing a claim by calling 1-800-338-2382 or visiting the VICP website at www.hrsa.gov/vaccinecompensation. There is a time limit to file a claim for compensation. 7. How can I learn more?  Ask your health care provider.  Call your local or state health department.  Contact the Centers for Disease Control and Prevention (CDC):  Call 1-800-232-4636 (1-800-CDC-INFO) or  Visit CDC's website at www.cdc.gov/flu CDC Vaccine Information Statement (Interim) Inactivated Influenza Vaccine (07/05/2013) Document Released: 08/28/2006 Document Revised: 03/20/2014 Document Reviewed: 10/21/2013 ExitCare Patient Information 2015 [employer]. This information is not intended to replace advice given to you by your health care provider. Make sure you discuss any questions you have with your health   care provider.  

## 2014-07-19 NOTE — Progress Notes (Signed)
Janet Woods is a 58 y.o. who presents today for her yearly physical.  Last mammogram about 1 yr ago and is having one done in about 1 month.  She had colonoscopy performed in 2012 which was normal.  She denies alcohol use or smoking use and is up to date with her immunizations.   HTN - Well controlled on HCTZ, compliant with medication, denies ADR.   Past Medical History  Diagnosis Date  . Hypertension   . Carpal tunnel syndrome     History   Social History  . Marital Status: Single    Spouse Name: N/A    Number of Children: N/A  . Years of Education: N/A   Occupational History  . Not on file.   Social History Main Topics  . Smoking status: Never Smoker   . Smokeless tobacco: Never Used  . Alcohol Use: No  . Drug Use: No  . Sexual Activity: Not Currently    Birth Control/ Protection: None   Other Topics Concern  . Not on file   Social History Narrative   Lives with daughter with CP Alver Fisher age 30).  No long term relationship.  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    Family History  Problem Relation Age of Onset  . Heart disease Mother 53    MI  . Cancer Sister 45    lung cancer  . Stroke Maternal Grandmother   . Diabetes Neg Hx   . Heart disease Maternal Grandfather     Current Outpatient Prescriptions on File Prior to Visit  Medication Sig Dispense Refill  . alum & mag hydroxide-simeth (MAALOX/MYLANTA) 200-200-20 MG/5ML suspension Take 30 mLs by mouth every 6 (six) hours as needed for indigestion or heartburn.      . calcium carbonate (TUMS - DOSED IN MG ELEMENTAL CALCIUM) 500 MG chewable tablet Chew 1 tablet by mouth 3 (three) times daily as needed for indigestion or heartburn.      . hydrochlorothiazide (HYDRODIURIL) 25 MG tablet Take 25 mg by mouth every morning.      . naproxen (NAPROSYN) 500 MG tablet Take 500 mg by mouth 2 (two) times daily as needed for mild pain.       No current facility-administered medications on file prior to visit.     Patient Information Form: Screening and ROS  AUDIT-C Score: 0 Do you feel safe in relationships? Yes.   PHQ-2:negative  Review of Symptoms (bold are positives)  General:  Negative for nexplained weight loss, fever Skin: Negative for new or changing mole, sore that won't heal HEENT: Negative for trouble hearing, trouble seeing, ringing in ears, mouth sores, hoarseness, change in voice, dysphagia. CV:  Negative for chest pain, dyspnea, edema, palpitations Resp: Negative for cough, dyspnea, hemoptysis GI: Negative for nausea, vomiting, diarrhea,  constipation, abdominal pain, melena, hematochezia. GU: Negative for dysuria, incontinence, urinary hesitance, hematuria, vaginal or penile discharge, polyuria, sexual difficulty, lumps in testicle or breasts MSK: Negative for muscle cramps or aches, joint pain or swelling Neuro: Negative for headaches, weakness, numbness, dizziness, passing out/fainting Psych: Negative for depression, anxiety, memory problems  Physical Exam Filed Vitals:   07/19/14 0902  BP: 148/89  Pulse: 99  Temp: 98.4 F (36.9 C)    Gen: NAD, Well nourished, Well developed HEENT: PERLA, EOMI, [place]/AT Neck: no JVD Cardio: RRR, No murmurs/gallops/rubs Lungs: CTA, no wheezes, rhonchi, crackles Abd: NABS, soft nontender nondistended MSK: ROM normal  Neuro: CN 2-12 intact, MS 5/5 B/L UE and  LE, +2 patellar and achilles relfex b/l  Psych: AAO x 3     Chemistry      Component Value Date/Time   NA 143 05/14/2014 1814   K 3.9 05/14/2014 1814   CL 100 05/14/2014 1814   CO2 27 05/14/2014 1814   BUN 10 05/14/2014 1814   CREATININE 0.69 05/14/2014 1814   CREATININE 0.73 02/13/2014 1049      Component Value Date/Time   CALCIUM 9.4 05/14/2014 1814   ALKPHOS 53 02/13/2014 1049   AST 19 02/13/2014 1049   ALT 17 02/13/2014 1049   BILITOT 0.3 02/13/2014 1049      Lab Results  Component Value Date   WBC 10.7* 05/14/2014   HGB 14.0 05/14/2014   HCT 41.5 05/14/2014   MCV 84.0  05/14/2014   PLT 316 05/14/2014   Lab Results  Component Value Date   TSH 1.325 02/13/2014   Lab Results  Component Value Date   HGBA1C 6.0 02/13/2014

## 2014-07-19 NOTE — Assessment & Plan Note (Addendum)
Goal < 140/90, continue HCTZ, obtain Micro:Creatinine ratio today and f/u in 6-12 months.

## 2014-07-20 ENCOUNTER — Encounter: Payer: Self-pay | Admitting: Family Medicine

## 2014-07-20 LAB — MICROALBUMIN / CREATININE URINE RATIO
Creatinine, Urine: 263.6 mg/dL
MICROALB UR: 1.65 mg/dL (ref 0.00–1.89)
Microalb Creat Ratio: 6.3 mg/g (ref 0.0–30.0)

## 2014-07-21 ENCOUNTER — Encounter: Payer: Self-pay | Admitting: Family Medicine

## 2014-07-21 NOTE — Progress Notes (Unsigned)
Pt needs wellness claim form filled out contact numbers for pick [phone] or [phone].

## 2014-07-25 ENCOUNTER — Ambulatory Visit (INDEPENDENT_AMBULATORY_CARE_PROVIDER_SITE_OTHER): Payer: BC Managed Care – PPO | Admitting: Family Medicine

## 2014-07-25 ENCOUNTER — Encounter: Payer: Self-pay | Admitting: Family Medicine

## 2014-07-25 VITALS — BP 164/93 | HR 83 | Temp 98.1°F | Ht 66.5 in | Wt 239.0 lb

## 2014-07-25 DIAGNOSIS — F4323 Adjustment disorder with mixed anxiety and depressed mood: Secondary | ICD-10-CM | POA: Insufficient documentation

## 2014-07-25 MED ORDER — TRAZODONE HCL 50 MG PO TABS: 50.0000 mg | ORAL_TABLET | Freq: Every day | ORAL | Status: DC

## 2014-07-25 NOTE — Patient Instructions (Signed)
Please call one of the therapists to find a therapist. Please start the trazadone 50 mg at bedtime. I will see you back in one month.  Thanks, Dr. Awanda Mink

## 2014-07-25 NOTE — Assessment & Plan Note (Signed)
Very typical adjustment disorder Sx at this point.  She denies SI/HI and would like to try something for sleep.  Will start trazadone to see if helps with sleep.  D/W Dr. Gwenlyn Saran and will have pt call her to set up therapy session either here or somewhere else.  Resource called psychology today shown to her to find therapist

## 2014-07-25 NOTE — Progress Notes (Signed)
Subjective:   Janet Woods is an 58 y.o. female who presents for evaluation and treatment of stress symptoms.  Onset approximately 3 months ago, gradually worsening since that time.  Current symptoms include trouble sleeping, stressed mood, feeling down .  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  Makes it very hard for her to want to go to work and seems to create all of her problems.  She denies stress or depressive Sx at home or anhedonia at home  Family history positive for depression in the patient's unknown.  Previous treatment modalities employed include None.  Past episodes of depression: None  Organic causes of depression present: None.  Review of Systems Pertinent items are noted in HPI.   Objective:   Mental Status Examination: Posture and motor behavior: Tearful  Dress, grooming, personal hygiene: Appropriate Facial expression: Appropriate Speech: Appropriate Mood: Appropriate Coherency and relevance of thought: Appropriate Thought content: Appropriate Perceptions: Appropriate Orientation:Appropriate Attention and concentration: Appropriate Memory: : Appropriate Information: Not examined Vocabulary: Appropriate Abstract reasoning: Appropriate Judgment: Appropriate    Assessment:   See Individual A/P

## 2014-07-27 ENCOUNTER — Telehealth: Payer: Self-pay | Admitting: Psychology

## 2014-07-27 NOTE — Progress Notes (Signed)
Completed, ready for pick up.  Thanks, Tamela Oddi. Awanda Mink, DO of [facility] 07/27/2014, 9:07 AM

## 2014-07-27 NOTE — Telephone Encounter (Addendum)
Ms. Janet Woods called and left several VMs to schedule an appointment for next week.  She needs a therapy appointment and I am not taking new patients right now.  I called her back and left a VM and hope we can touch base tomorrow.

## 2014-07-27 NOTE — Progress Notes (Unsigned)
Form to be completed was placed in Dr Awanda Mink box.Christos Mixson S Left message on patient's voicemail,letting her know form was completed and ready for pick up.Satya Bohall, Lewie Loron

## 2014-07-28 NOTE — Telephone Encounter (Signed)
Connected by phone.  She was upset to learn I was not taking new patients.  Discussed options.  She has BC/BS.  [facility] is on [address] - near where she lives.  They have several counselors there that work with stress and ways to manage it.  Recommended to people in particular:  [place].  Irem stated she was going to call immediately.  I will let Dr. Awanda Mink know.

## 2014-08-17 ENCOUNTER — Encounter (HOSPITAL_COMMUNITY): Payer: Self-pay | Admitting: Emergency Medicine

## 2014-08-17 ENCOUNTER — Emergency Department (HOSPITAL_COMMUNITY)
Admission: EM | Admit: 2014-08-17 | Discharge: 2014-08-17 | Disposition: A | Discharge status: Home / Self Care | Payer: BC Managed Care – PPO | Attending: Emergency Medicine | Admitting: Emergency Medicine

## 2014-08-17 DIAGNOSIS — I10 Essential (primary) hypertension: Secondary | ICD-10-CM | POA: Insufficient documentation

## 2014-08-17 DIAGNOSIS — Z79899 Other long term (current) drug therapy: Secondary | ICD-10-CM | POA: Insufficient documentation

## 2014-08-17 DIAGNOSIS — G5601 Carpal tunnel syndrome, right upper limb: Secondary | ICD-10-CM | POA: Insufficient documentation

## 2014-08-17 DIAGNOSIS — M79601 Pain in right arm: Secondary | ICD-10-CM | POA: Diagnosis present

## 2014-08-17 MED ORDER — DEXAMETHASONE 4 MG PO TABS
6.0000 mg | ORAL_TABLET | Freq: Once | ORAL | Status: AC
  Administered 2014-08-17: 6 mg via ORAL
  Filled 2014-08-17: qty 2

## 2014-08-17 NOTE — Discharge Instructions (Signed)
Carpal Tunnel Syndrome The carpal tunnel is a narrow area located on the palm side of your wrist. The tunnel is formed by the wrist bones and ligaments. Nerves, blood vessels, and tendons pass through the carpal tunnel. Repeated wrist motion or certain diseases may cause swelling within the tunnel. This swelling pinches the main nerve in the wrist (median nerve) and causes the painful hand and arm condition called carpal tunnel syndrome. CAUSES   Repeated wrist motions.  Wrist injuries.  Certain diseases like arthritis, diabetes, alcoholism, hyperthyroidism, and kidney failure.  Obesity.  Pregnancy. SYMPTOMS   A "pins and needles" feeling in your fingers or hand, especially in your thumb, index and middle fingers.  Tingling or numbness in your fingers or hand.  An aching feeling in your entire arm, especially when your wrist and elbow are bent for long periods of time.  Wrist pain that goes up your arm to your shoulder.  Pain that goes down into your palm or fingers.  A weak feeling in your hands. DIAGNOSIS  Your health care provider will take your history and perform a physical exam. An electromyography test may be needed. This test measures electrical signals sent out by your nerves into the muscles. The electrical signals are usually slowed by carpal tunnel syndrome. You may also need X-rays. TREATMENT  Carpal tunnel syndrome may clear up by itself. Your health care provider may recommend a wrist splint or medicine such as a nonsteroidal anti-inflammatory medicine. Cortisone injections may help. Sometimes, surgery may be needed to free the pinched nerve.  HOME CARE INSTRUCTIONS   Take all medicine as directed by your health care provider. Only take over-the-counter or prescription medicines for pain, discomfort, or fever as directed by your health care provider.  If you were given a splint to keep your wrist from bending, wear it as directed. It is important to wear the splint at  night. Wear the splint for as long as you have pain or numbness in your hand, arm, or wrist. This may take 1 to 2 months.  Rest your wrist from any activity that may be causing your pain. If your symptoms are work-related, you may need to talk to your employer about changing to a job that does not require using your wrist.  Put ice on your wrist after long periods of wrist activity.  Put ice in a plastic bag.  Place a towel between your skin and the bag.  Leave the ice on for 15-20 minutes, 03-04 times a day.  Keep all follow-up visits as directed by your health care provider. This includes any orthopedic referrals, physical therapy, and rehabilitation. Any delay in getting necessary care could result in a delay or failure of your condition to heal. SEEK IMMEDIATE MEDICAL CARE IF:   You have new, unexplained symptoms.  Your symptoms get worse and are not helped or controlled with medicines. MAKE SURE YOU:   Understand these instructions.  Will watch your condition.  Will get help right away if you are not doing well or get worse. Document Released: 10/31/2000 Document Revised: 03/20/2014 Document Reviewed: 09/19/2011 ExitCare Patient Information 2015 [employer]. This information is not intended to replace advice given to you by your health care provider. Make sure you discuss any questions you have with your health care provider.  

## 2014-08-17 NOTE — ED Provider Notes (Signed)
CSN: [id number]     Arrival date & time 08/17/14  1017 History   First MD Initiated Contact with Patient 08/17/14 0445     Chief Complaint  Patient presents with  . Hand Pain      HPI Patient reports increasing right wrist pain with radiation into the middle finger and the right ring finger.  She states this feels like her prior carpal tunnel symptoms in the past.  She denies recent injury or trauma.  No numbness or tingling.  No fevers or chills.  She states her discomfort is worse in the morning.  She never followed up with hand surgery before because she did not want surgery.   Past Medical History  Diagnosis Date  . Hypertension   . Carpal tunnel syndrome    History reviewed. No pertinent past surgical history. Family History  Problem Relation Age of Onset  . Heart disease Mother 69    MI  . Cancer Sister 62    lung cancer  . Stroke Maternal Grandmother   . Diabetes Neg Hx   . Heart disease Maternal Grandfather    History  Substance Use Topics  . Smoking status: Never Smoker   . Smokeless tobacco: Never Used  . Alcohol Use: No   OB History   Grav Para Term Preterm Abortions TAB SAB Ect Mult Living                 Review of Systems  All other systems reviewed and are negative.     Allergies  Review of patient's allergies indicates no known allergies.  Home Medications   Prior to Admission medications   Medication Sig Start Date End Date Taking? Authorizing Provider  hydrochlorothiazide (HYDRODIURIL) 25 MG tablet Take 25 mg by mouth every morning.    Historical Provider, MD  naproxen (NAPROSYN) 500 MG tablet Take 500 mg by mouth 2 (two) times daily as needed for mild pain.    Historical Provider, MD  traZODone (DESYREL) 50 MG tablet Take 1 tablet (50 mg total) by mouth at bedtime. 07/25/14   Bryan R Hess, DO   BP 106/67  Pulse 67  Temp(Src) 98 F (36.7 C) (Oral)  Resp 18  SpO2 99% Physical Exam  Nursing note and vitals reviewed. Constitutional: She is  oriented to person, place, and time. She appears well-developed and well-nourished.  HENT:  Head: Normocephalic.  Eyes: EOM are normal.  Neck: Normal range of motion.  Pulmonary/Chest: Effort normal.  Abdominal: She exhibits no distension.  Musculoskeletal: Normal range of motion.  Mild tenderness over the midline of the right anterior wrist.  Full range of motion of right wrist.  Normal range of motion of all fingers.  No significant swelling or erythema of the right wrist or hand.  Normal right radial pulse.  Neurological: She is alert and oriented to person, place, and time.  Psychiatric: She has a normal mood and affect.    ED Course  Procedures (including critical care time) Labs Review Labs Reviewed - No data to display  Imaging Review No results found.   EKG Interpretation None      MDM   Final diagnoses:  Carpal tunnel syndrome of right wrist    Suspect her paternal.  PCP and hand surgery followup.  Patient requested a dose of steroids in the ER as she states this has helped before in the past.  His Decadron was given.  Recommended anti-inflammatories.  Cockup wrist splint was given    Metta Clines  Venora Maples, MD 08/17/14 [phone]

## 2014-08-17 NOTE — ED Notes (Addendum)
Pt. reports pain at right fingers onset 9 pm last night - states carpal tunnel syndrome pain unrelieved by Naproxen .

## 2014-08-21 ENCOUNTER — Ambulatory Visit
Admission: RE | Admit: 2014-08-21 | Discharge: 2014-08-21 | Disposition: A | Discharge status: Home / Self Care | Payer: BC Managed Care – PPO | Source: Ambulatory Visit

## 2014-08-21 DIAGNOSIS — Z1231 Encounter for screening mammogram for malignant neoplasm of breast: Secondary | ICD-10-CM

## 2014-09-06 ENCOUNTER — Ambulatory Visit: Payer: BC Managed Care – PPO | Admitting: Family Medicine

## 2014-09-24 ENCOUNTER — Encounter (HOSPITAL_COMMUNITY): Payer: Self-pay | Admitting: Emergency Medicine

## 2014-09-24 ENCOUNTER — Emergency Department (HOSPITAL_COMMUNITY)
Admission: EM | Admit: 2014-09-24 | Discharge: 2014-09-24 | Disposition: A | Discharge status: Home / Self Care | Payer: BC Managed Care – PPO | Attending: Emergency Medicine | Admitting: Emergency Medicine

## 2014-09-24 DIAGNOSIS — M7989 Other specified soft tissue disorders: Secondary | ICD-10-CM

## 2014-09-24 DIAGNOSIS — I10 Essential (primary) hypertension: Secondary | ICD-10-CM | POA: Diagnosis not present

## 2014-09-24 DIAGNOSIS — Z79899 Other long term (current) drug therapy: Secondary | ICD-10-CM | POA: Diagnosis not present

## 2014-09-24 DIAGNOSIS — Z791 Long term (current) use of non-steroidal anti-inflammatories (NSAID): Secondary | ICD-10-CM | POA: Diagnosis not present

## 2014-09-24 DIAGNOSIS — Z8669 Personal history of other diseases of the nervous system and sense organs: Secondary | ICD-10-CM | POA: Insufficient documentation

## 2014-09-24 DIAGNOSIS — L7621 Postprocedural hemorrhage and hematoma of skin and subcutaneous tissue following a dermatologic procedure: Secondary | ICD-10-CM | POA: Insufficient documentation

## 2014-09-24 LAB — I-STAT CHEM 8, ED
BUN: 8 mg/dL (ref 6–23)
CREATININE: 0.7 mg/dL (ref 0.50–1.10)
Calcium, Ion: 1.15 mmol/L (ref 1.12–1.23)
Chloride: 103 mEq/L (ref 96–112)
GLUCOSE: 100 mg/dL — AB (ref 70–99)
HCT: 42 % (ref 36.0–46.0)
HEMOGLOBIN: 14.3 g/dL (ref 12.0–15.0)
POTASSIUM: 4.1 meq/L (ref 3.7–5.3)
Sodium: 142 mEq/L (ref 137–147)
TCO2: 28 mmol/L (ref 0–100)

## 2014-09-24 NOTE — ED Notes (Signed)
Pt got blood done at [facility] on Tuesday and has bruising and swelling that same day then went back to their facility the next day and put ice on it.  Pt got another bruise on same arm that she noticed on Thursday. Pt states that she fell over wheelchair and not for sure if the second bruise came from the fall or not. Pt came in to make sure she doesn't have blood clot in left arm.

## 2014-09-24 NOTE — Progress Notes (Signed)
VASCULAR LAB PRELIMINARY  PRELIMINARY  PRELIMINARY  PRELIMINARY  Left upper extremity venous Doppler completed.    Preliminary report:  There is no DVT or SVT noted in the left upper extremity.   Jamy Cleckler, RVT 09/24/2014, 6:05 PM

## 2014-09-24 NOTE — ED Provider Notes (Signed)
CSN: [id number]     Arrival date & time 09/24/14  1518 History   First MD Initiated Contact with Patient 09/24/14 1600     Chief Complaint  Patient presents with  . Bleeding/Bruising     (Consider location/radiation/quality/duration/timing/severity/associated sxs/prior Treatment) Patient is a 58 y.o. female presenting with general illness. The history is provided by the patient.  Illness Location:  L medial elbow Quality:  Bruising, swelling Severity:  Mild Onset quality:  Gradual Duration:  5 days Timing:  Constant Progression:  Unchanged Chronicity:  New Context:  Had phlebotomy done on that elbow 5 days ago, had bruising and swelling since Relieved by:  Some relief with ice pack Worsened by:  Nothing Associated symptoms: no abdominal pain, no chest pain, no cough, no fever, no shortness of breath and no vomiting     Past Medical History  Diagnosis Date  . Hypertension   . Carpal tunnel syndrome    History reviewed. No pertinent past surgical history. Family History  Problem Relation Age of Onset  . Heart disease Mother 63    MI  . Cancer Sister 65    lung cancer  . Stroke Maternal Grandmother   . Diabetes Neg Hx   . Heart disease Maternal Grandfather    History  Substance Use Topics  . Smoking status: Never Smoker   . Smokeless tobacco: Never Used  . Alcohol Use: No   OB History    No data available     Review of Systems  Constitutional: Negative for fever and chills.  Respiratory: Negative for cough and shortness of breath.   Cardiovascular: Negative for chest pain and leg swelling.  Gastrointestinal: Negative for vomiting and abdominal pain.  All other systems reviewed and are negative.     Allergies  Review of patient's allergies indicates no known allergies.  Home Medications   Prior to Admission medications   Medication Sig Start Date End Date Taking? Authorizing Provider  hydrochlorothiazide (HYDRODIURIL) 25 MG tablet Take 25 mg by mouth  every morning.   Yes Historical Provider, MD  naproxen (NAPROSYN) 500 MG tablet Take 500 mg by mouth 2 (two) times daily as needed for mild pain.   Yes Historical Provider, MD  traZODone (DESYREL) 50 MG tablet Take 50 mg by mouth at bedtime as needed for sleep.   Yes Historical Provider, MD   BP 130/81 mmHg  Pulse 69  Temp(Src) 97.7 F (36.5 C) (Oral)  Resp 18  SpO2 96% Physical Exam  Constitutional: She is oriented to person, place, and time. She appears well-developed and well-nourished. No distress.  HENT:  Head: Normocephalic and atraumatic.  Mouth/Throat: Oropharynx is clear and moist.  Eyes: EOM are normal. Pupils are equal, round, and reactive to light.  Neck: Normal range of motion. Neck supple.  Cardiovascular: Normal rate and regular rhythm.  Exam reveals no friction rub.   No murmur heard. Pulmonary/Chest: Effort normal and breath sounds normal. No respiratory distress. She has no wheezes. She has no rales.  Abdominal: Soft. She exhibits no distension. There is no tenderness. There is no rebound.  Musculoskeletal: Normal range of motion. She exhibits no edema.       Arms: Neurological: She is alert and oriented to person, place, and time.  Skin: She is not diaphoretic.  Nursing note and vitals reviewed.   ED Course  Procedures (including critical care time) Labs Review Labs Reviewed  I-STAT CHEM 8, ED    Imaging Review No results found.   EKG Interpretation  None     Progress Notes by Iantha Fallen, RVS at 09/24/2014 6:05 PM    Author: Iantha Fallen, RVS Service: Vascular Lab Author Type: Cardiovascular Sonographer   Filed: 09/24/2014 6:05 PM Note Time: 09/24/2014 6:05 PM Status: Signed   Editor: Iantha Fallen, RVS (Cardiovascular Sonographer)     Expand All Collapse All   VASCULAR LAB PRELIMINARY PRELIMINARY PRELIMINARY PRELIMINARY  Left upper extremity venous Doppler completed.   Preliminary report: There is no DVT or SVT noted in the  left upper extremity.   KANADY, CANDACE, RVT 09/24/2014, 6:05 PM      MDM   Final diagnoses:  Left arm swelling    58 year old female here with medial left elbow swelling. Began after having phlebotomy done in her left before meals fossa 5 days ago. Some mild transient relief with an ice pack. No pain. No shortness of breath, chest pain, fever, diarrhea, vomiting. No history of blood clots.  Patient is extremely concerned she has a blood clot. I explained that she may have superficial thrombophlebitis, however she is demanding an ultrasound.  [place] negative. Stable for discharge.  Evelina Bucy, MD 09/24/14 [phone]

## 2014-10-31 ENCOUNTER — Encounter: Payer: Self-pay | Admitting: *Deleted

## 2014-11-20 ENCOUNTER — Other Ambulatory Visit: Payer: Self-pay | Admitting: Family Medicine

## 2014-12-18 ENCOUNTER — Other Ambulatory Visit: Payer: Self-pay | Admitting: *Deleted

## 2014-12-18 MED ORDER — TRAZODONE HCL 50 MG PO TABS: 50.0000 mg | ORAL_TABLET | Freq: Every evening | ORAL | Status: DC | PRN

## 2015-04-02 IMAGING — MG MM SCREEN MAMMOGRAM BILATERAL
4 series · 4 of 4 positions shown · non-contrast
Comparison: Previous exam(s).

CLINICAL DATA: Screening.

EXAM:
DIGITAL SCREENING BILATERAL MAMMOGRAM WITH CAD

[R CC]
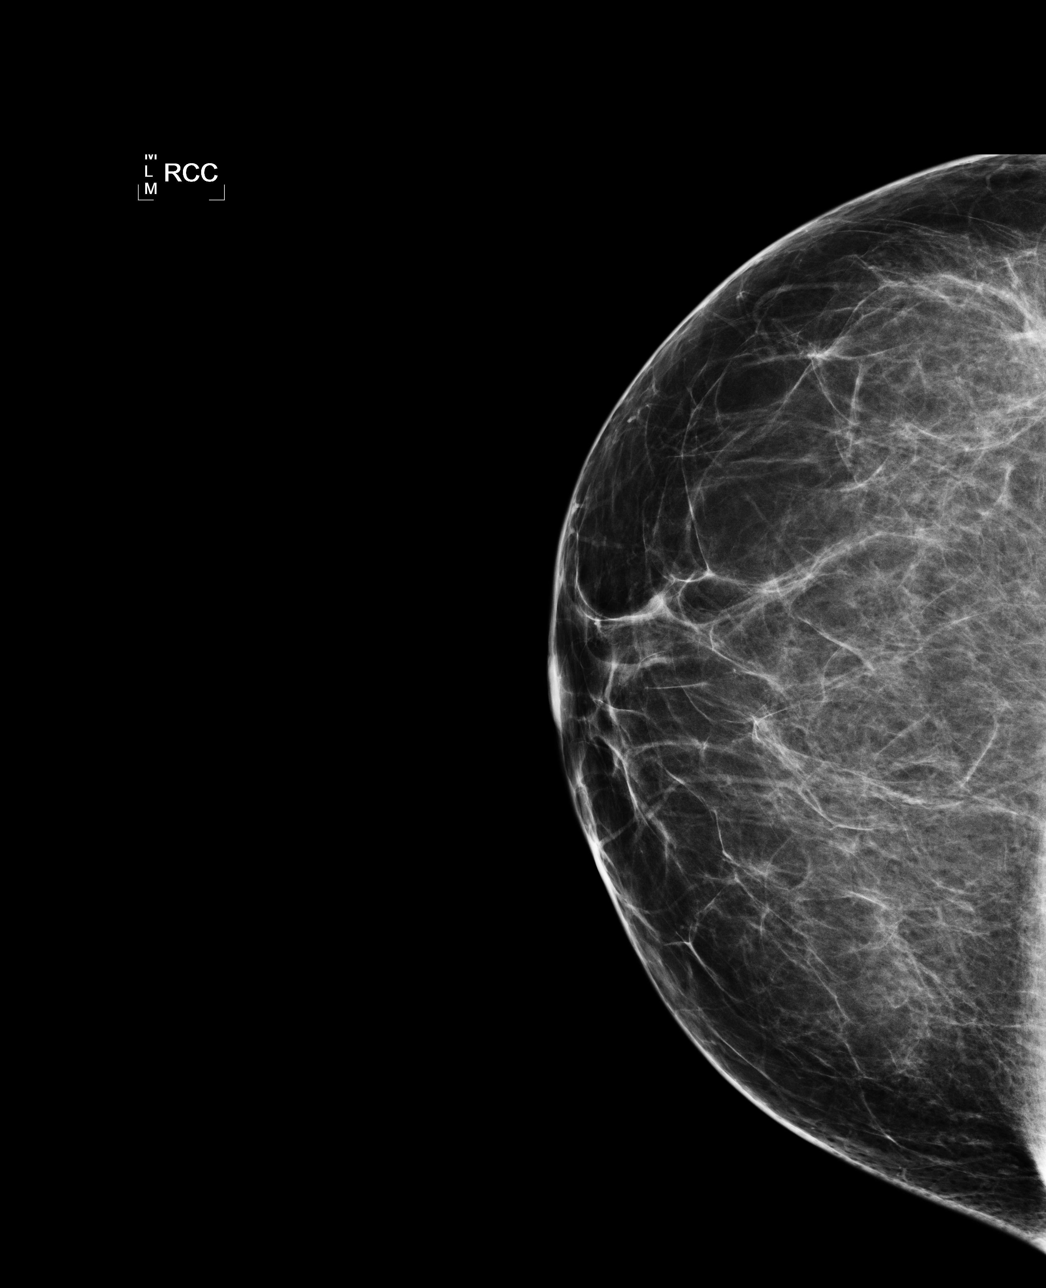

[L CC]
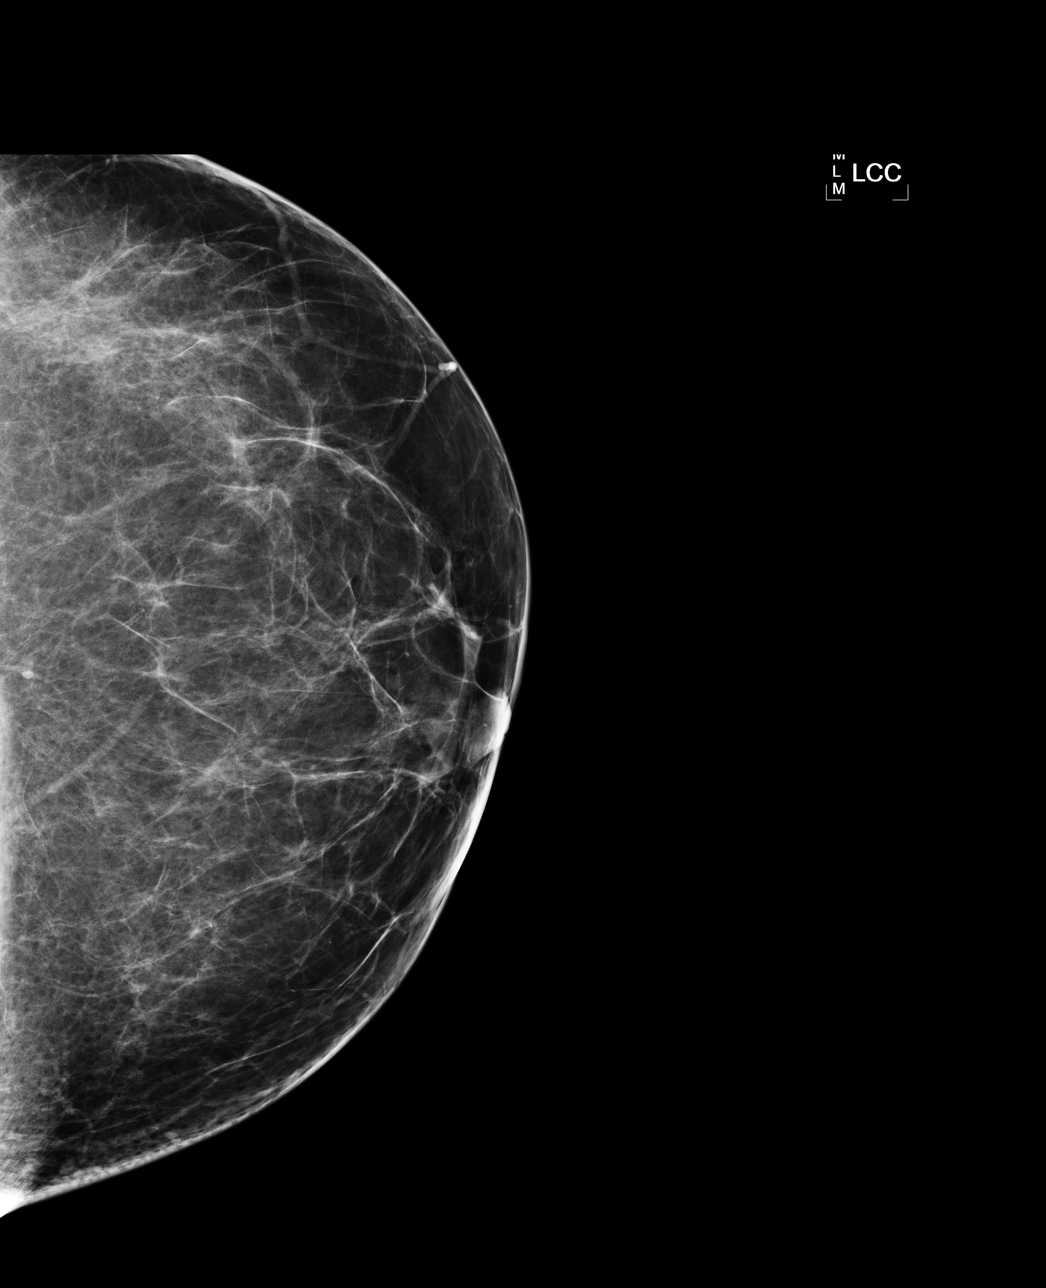

[L MLO]
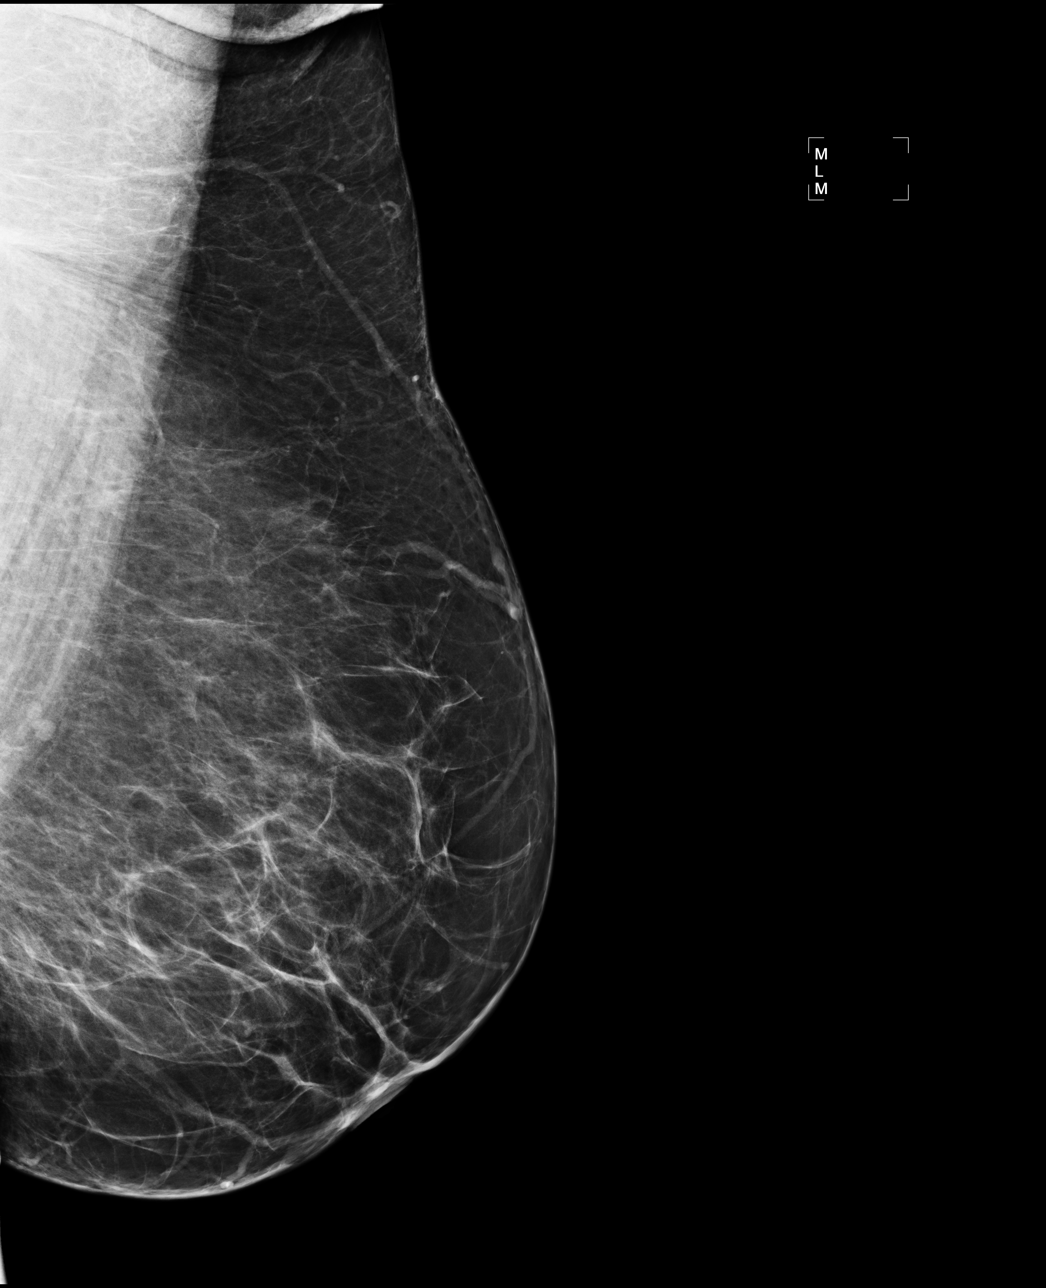

[R MLO]
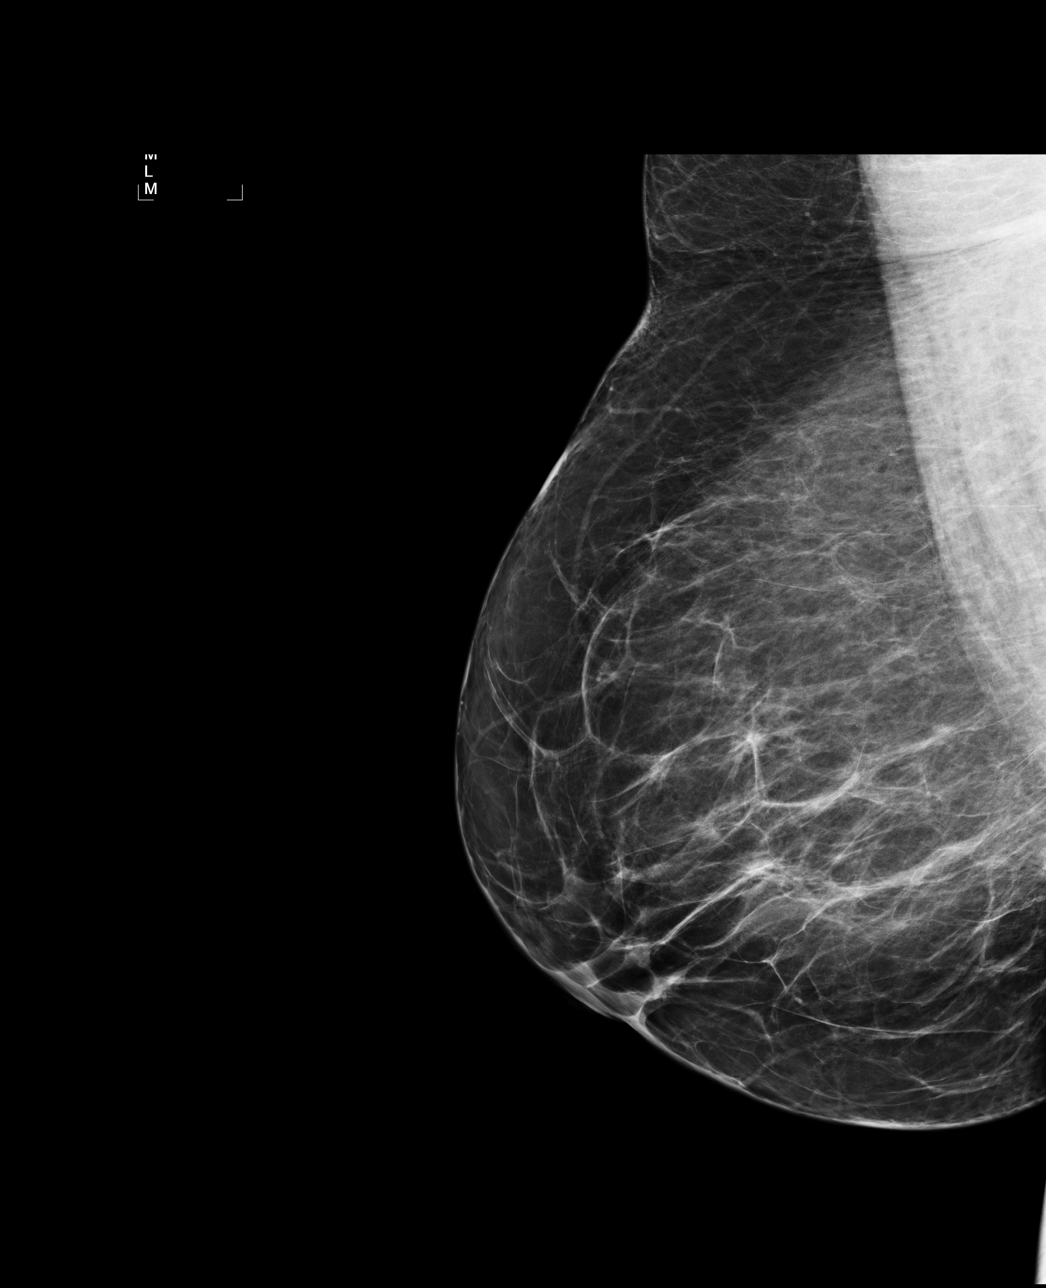

[4 of 4 positions shown; findings below may reference images not displayed]

ACR Breast Density Category b: There are scattered areas of
fibroglandular density.
FINDINGS: There are no findings suspicious for malignancy. Images were
processed with CAD.
IMPRESSION: No mammographic evidence of malignancy. A result letter of this
screening mammogram will be mailed directly to the patient.

RECOMMENDATION:
Screening mammogram in one year. (Code:AS-G-LCT)

BI-RADS CATEGORY  1: Negative.

## 2015-05-11 ENCOUNTER — Encounter: Payer: Self-pay | Admitting: Family Medicine

## 2015-05-11 ENCOUNTER — Ambulatory Visit (HOSPITAL_COMMUNITY)
Admission: RE | Admit: 2015-05-11 | Discharge: 2015-05-11 | Disposition: A | Discharge status: Home / Self Care | Payer: BLUE CROSS/BLUE SHIELD | Source: Ambulatory Visit | Attending: Family Medicine | Admitting: Family Medicine

## 2015-05-11 ENCOUNTER — Ambulatory Visit (INDEPENDENT_AMBULATORY_CARE_PROVIDER_SITE_OTHER): Payer: BLUE CROSS/BLUE SHIELD | Admitting: Family Medicine

## 2015-05-11 VITALS — BP 135/87 | HR 97 | Temp 97.9°F | Wt 256.4 lb

## 2015-05-11 DIAGNOSIS — R0602 Shortness of breath: Secondary | ICD-10-CM | POA: Insufficient documentation

## 2015-05-11 DIAGNOSIS — M7989 Other specified soft tissue disorders: Secondary | ICD-10-CM | POA: Diagnosis not present

## 2015-05-11 DIAGNOSIS — E669 Obesity, unspecified: Secondary | ICD-10-CM | POA: Insufficient documentation

## 2015-05-11 DIAGNOSIS — I10 Essential (primary) hypertension: Secondary | ICD-10-CM | POA: Insufficient documentation

## 2015-05-11 DIAGNOSIS — Z8249 Family history of ischemic heart disease and other diseases of the circulatory system: Secondary | ICD-10-CM | POA: Diagnosis not present

## 2015-05-11 LAB — COMPREHENSIVE METABOLIC PANEL
ALT: 14 U/L (ref 0–35)
AST: 16 U/L (ref 0–37)
Albumin: 3.7 g/dL (ref 3.5–5.2)
Alkaline Phosphatase: 68 U/L (ref 39–117)
BUN: 13 mg/dL (ref 6–23)
CO2: 29 mEq/L (ref 19–32)
Calcium: 9.4 mg/dL (ref 8.4–10.5)
Chloride: 104 mEq/L (ref 96–112)
Creat: 0.64 mg/dL (ref 0.50–1.10)
GLUCOSE: 92 mg/dL (ref 70–99)
Potassium: 3.8 mEq/L (ref 3.5–5.3)
Sodium: 144 mEq/L (ref 135–145)
Total Bilirubin: 0.2 mg/dL (ref 0.2–1.2)
Total Protein: 7.7 g/dL (ref 6.0–8.3)

## 2015-05-11 LAB — CBC
HCT: 37.4 % (ref 36.0–46.0)
Hemoglobin: 12.6 g/dL (ref 12.0–15.0)
MCH: 27.8 pg (ref 26.0–34.0)
MCHC: 33.7 g/dL (ref 30.0–36.0)
MCV: 82.6 fL (ref 78.0–100.0)
MPV: 9 fL (ref 8.6–12.4)
PLATELETS: 306 10*3/uL (ref 150–400)
RBC: 4.53 MIL/uL (ref 3.87–5.11)
RDW: 15 % (ref 11.5–15.5)
WBC: 9 10*3/uL (ref 4.0–10.5)

## 2015-05-11 LAB — TSH: TSH: 1.116 u[IU]/mL (ref 0.350–4.500)

## 2015-05-11 NOTE — Progress Notes (Signed)
    Subjective   Janet Woods is a 59 y.o. female that presents for a same day visit. PMH HTN, HLD, and GERD.   LEG SWELLING Her weight has been increasing.   Having leg swelling for 1 month  Location: both legs  Timing of swelling: beginning of the day  New medications: no new medications  History of Kidney problems: no  History of Heart problems: no  History of Liver problems: no History of cancer: no  Symptoms Chest pain: no Shortness of breath: yes  Immobility: no Black or bloody bowel movements: no Severe snoring or day time sleepiness: no Abdomen swelling: yes  History of blood clots: no Weight Loss: no  She is having orthopnea and PND. She hasn't been able to ly flat in her bed for the past two months.  She hasn't worn her TED hose in a week since she can't get them on her legs.    Smoking Status noted   History  Substance Use Topics  . Smoking status: Never Smoker   . Smokeless tobacco: Never Used  . Alcohol Use: No    ROS Per HPI  Objective   BP 135/87 mmHg  Pulse 97  Temp(Src) 97.9 F (36.6 C)  Wt 256 lb 6.4 oz (116.302 kg)  Gen: NAD, alert, cooperative with exam, well-appearing CV: RRR, good S1/S2, no murmur, Resp: CTABL, no wheezes, non-labored Skin: no rashes, normal turgor  Neuro: no gross deficits.  Ext: +2-3 pitting edema b/l LE, left lower leg is >3 cm in calf circumference compared to right.   Assessment and Plan   Please refer to problem based charting of assessment and plan

## 2015-05-11 NOTE — Progress Notes (Signed)
VASCULAR LAB PRELIMINARY  PRELIMINARY  PRELIMINARY  PRELIMINARY  Bilateral lower extremity venous duplex completed.    Preliminary report:  Bilateral:  No evidence of DVT, superficial thrombosis, or Baker's Cyst.   Abdou Stocks, RVS 05/11/2015, 10:56 AM

## 2015-05-11 NOTE — Assessment & Plan Note (Addendum)
Bilateral leg swelling that is most likely venous insufficiency but left calf is more swollen than right. Wells score for DVT is 1 but will pursue venous duplex of bilateral lower extremities this patient is a poor historian Unsure if she's had a previous blood clot or not. Also having some PND and orthopnea with a normal echo done in 2010. No crackles heard on exam. She only takes naproxen as needed for her pain - Stat venous duplex ordered today and scheduled for the patient - Echo ordered - I will call patient with results - Discussed with Dr. Ree Kida

## 2015-05-11 NOTE — Patient Instructions (Addendum)
Thank you for coming in,    We are going to get some tests done on you today.   Please go for a test that will check your legs for any clots.    We will get the ECHO which is a function of your heart.   I will call you with the labs that we have gotten today.   Please bring all of your medications with you to each visit.    Please feel free to call with any questions or concerns at any time, at [phone]. --Dr. Raeford Razor

## 2015-05-14 ENCOUNTER — Telehealth: Payer: Self-pay | Admitting: Family Medicine

## 2015-05-14 ENCOUNTER — Encounter: Payer: Self-pay | Admitting: Family Medicine

## 2015-05-14 NOTE — Telephone Encounter (Signed)
Would like lab results

## 2015-05-15 NOTE — Telephone Encounter (Signed)
Will forward to Dr. Raeford Razor who saw her.   Thanks, Gaspar Bidding

## 2015-05-16 NOTE — Telephone Encounter (Signed)
Left VM for patient. If he calls back please have her speak with a nurse/CMA and let her know that her labs are normal. Her venous duplex was normal. Now we are just waiting on what the ECHO shows, once it is completed.    If any questions then please take the best time and phone number to call and I will try to call her back.   Rosemarie Ax, MD PGY-2, [facility] 05/16/2015, 3:07 PM

## 2015-05-17 ENCOUNTER — Ambulatory Visit (HOSPITAL_COMMUNITY): Payer: BLUE CROSS/BLUE SHIELD | Attending: Cardiology

## 2015-05-17 ENCOUNTER — Other Ambulatory Visit: Payer: Self-pay

## 2015-05-17 ENCOUNTER — Encounter (HOSPITAL_COMMUNITY): Payer: Self-pay | Admitting: Radiology

## 2015-05-17 DIAGNOSIS — R0602 Shortness of breath: Secondary | ICD-10-CM

## 2015-05-17 DIAGNOSIS — R0600 Dyspnea, unspecified: Secondary | ICD-10-CM | POA: Diagnosis present

## 2015-05-17 DIAGNOSIS — I517 Cardiomegaly: Secondary | ICD-10-CM | POA: Diagnosis not present

## 2015-05-17 DIAGNOSIS — M7989 Other specified soft tissue disorders: Secondary | ICD-10-CM | POA: Diagnosis not present

## 2015-05-22 ENCOUNTER — Telehealth: Payer: Self-pay | Admitting: Family Medicine

## 2015-05-22 NOTE — Telephone Encounter (Signed)
Left VM for patient. If she calls back please have her speak with a nurse/CMA and inform that her ECHO showed grade 1 diastolic dysfunction which is the filling of the heart.  This is something to monitor but should not cause her leg swelling. She should wear compression hoses for her leg swelling if it has continued.   If any questions then please take the best time and phone number to call and I will try to call her back.   Rosemarie Ax, MD PGY-3, [facility] 05/22/2015, 4:33 PM

## 2015-06-13 ENCOUNTER — Ambulatory Visit: Payer: BLUE CROSS/BLUE SHIELD | Admitting: Family Medicine

## 2015-06-19 ENCOUNTER — Encounter: Payer: Self-pay | Admitting: Family Medicine

## 2015-06-19 ENCOUNTER — Ambulatory Visit (INDEPENDENT_AMBULATORY_CARE_PROVIDER_SITE_OTHER): Payer: BLUE CROSS/BLUE SHIELD | Admitting: Family Medicine

## 2015-06-19 VITALS — BP 131/73 | HR 82 | Temp 98.1°F | Ht 66.5 in | Wt 255.9 lb

## 2015-06-19 DIAGNOSIS — I10 Essential (primary) hypertension: Secondary | ICD-10-CM

## 2015-06-19 DIAGNOSIS — G5601 Carpal tunnel syndrome, right upper limb: Secondary | ICD-10-CM

## 2015-06-19 DIAGNOSIS — E669 Obesity, unspecified: Secondary | ICD-10-CM

## 2015-06-19 DIAGNOSIS — G5602 Carpal tunnel syndrome, left upper limb: Secondary | ICD-10-CM

## 2015-06-19 DIAGNOSIS — M7989 Other specified soft tissue disorders: Secondary | ICD-10-CM

## 2015-06-19 DIAGNOSIS — G479 Sleep disorder, unspecified: Secondary | ICD-10-CM

## 2015-06-19 DIAGNOSIS — G5603 Carpal tunnel syndrome, bilateral upper limbs: Secondary | ICD-10-CM

## 2015-06-19 LAB — LIPID PANEL
Cholesterol: 139 mg/dL (ref 125–200)
HDL: 45 mg/dL — ABNORMAL LOW (ref >=46)
LDL Cholesterol: 81 mg/dL (ref <130)
Total CHOL/HDL Ratio: 3.1 Ratio (ref <=5.0)
Triglycerides: 67 mg/dL (ref <150)
VLDL: 13 mg/dL (ref <30)

## 2015-06-19 MED ORDER — NAPROXEN 500 MG PO TABS: 500.0000 mg | ORAL_TABLET | Freq: Two times a day (BID) | ORAL | Status: DC | PRN

## 2015-06-19 MED ORDER — TRAZODONE HCL 50 MG PO TABS: 50.0000 mg | ORAL_TABLET | Freq: Every evening | ORAL | Status: DC | PRN

## 2015-06-19 NOTE — Progress Notes (Signed)
Patient ID: Janet Woods, female   DOB: July 20, 1956, 60 y.o.   MRN: [id number]    Subjective: PQ:AESLP HPI: Patient is a 59 y.o. female presenting to clinic today for office visit. Concerns today include:  1. Swelling Patient reports that she has had chronic extremity swelling.  She reports that her rings do not fit her.  She is compliant with HCTZ, which helps some but not much.  She reports that she eats out a lot at work.  She on a weekly basis eats [place] food, pizza, and other take out foods.  She seldomly cooks at home.  She does wear TED hose when she goes to work and around the house. 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  She denies SOB, CP, dizziness.      2. Hypertension, controlled Blood pressure at home: 140-150's/80's Blood pressure today: 131/73 Meds: Compliant with HCTZ Side effects: none ROS: Denies headache, dizziness, visual changes, nausea, vomiting, chest pain, abdominal pain or shortness of breath.  3. Carpal tunnel Patient reports that wrist pain is well controlled with Naproxen.  Does not have to use daily.  No weakness.  Social History Reviewed: non smoker. FamHx and MedHx updated.  Please see EMR. Health Maintenance: pap due  ROS: All other systems reviewed and are negative.  Objective: Office vital signs reviewed. BP 131/73 mmHg  Pulse 82  Temp(Src) 98.1 F (36.7 C) (Oral)  Ht 5' 6.5" (1.689 m)  Wt 255 lb 14.4 oz (116.075 kg)  BMI 40.69 kg/m2  Physical Examination:  General: Awake, alert, obese female, NAD HEENT: Normal, EOMI Cardio: normal rate, +1 radial pulses Pulm: no wheeze, no increased WOB Extremities: WWP, No cyanosis or clubbing; +1 pulses bilaterally, +1 pitting edema to b/l shins, no warmth or TTP to calves MSK: Normal gait and station Skin: dry, intact, no rashes or lesions  ECHOCARDIOGRAM reviewed.  Last CMET, TSH, CBC reviewed.  Assessment: 59 y.o. female with chronic LE swelling and HTN, controlled.  Plan: See Problem  List and After Visit Summary   Janet Norlander, DO PGY-2, [place]

## 2015-06-19 NOTE — Patient Instructions (Addendum)
It was a pleasure seeing you today, Janet Woods.  Information regarding what we discussed is included in this packet.  Please make an appointment to see me soon for your annual physical exam.  We will do your pap smear at that time.  Please feel free to call our office at [phone] if any questions or concerns arise.  Warm Regards, Dameer Speiser M. Ursula Dermody, DO  Edema Edema is an abnormal buildup of fluids. It is more common in your legs and thighs. Painless swelling of the feet and ankles is more likely as a person ages. It also is common in looser skin, like around your eyes. HOME CARE   Keep the affected body part above the level of the heart while lying down.  Do not sit still or stand for a long time.  Do not put anything right under your knees when you lie down.  Do not wear tight clothes on your upper legs.  Exercise your legs to help the puffiness (swelling) go down.  Wear elastic bandages or support stockings as told by your doctor.  A low-salt diet may help lessen the puffiness.  Only take medicine as told by your doctor. GET HELP IF:  Treatment is not working.  You have heart, liver, or kidney disease and notice that your skin looks puffy or shiny.  You have puffiness in your legs that does not get better when you raise your legs.  You have sudden weight gain for no reason. GET HELP RIGHT AWAY IF:   You have shortness of breath or chest pain.  You cannot breathe when you lie down.  You have pain, redness, or warmth in the areas that are puffy.  You have heart, liver, or kidney disease and get edema all of a sudden.  You have a fever and your symptoms get worse all of a sudden. MAKE SURE YOU:   Understand these instructions.  Will watch your condition.  Will get help right away if you are not doing well or get worse. Document Released: 04/21/2008 Document Revised: 11/08/2013 Document Reviewed: 08/26/2013 [facility] Patient Information 2015 [place], [place].  This information is not intended to replace advice given to you by your health care provider. Make sure you discuss any questions you have with your health care provider.

## 2015-06-19 NOTE — Assessment & Plan Note (Signed)
Well controlled with Naproxen -Refill given today.

## 2015-06-19 NOTE — Assessment & Plan Note (Signed)
Controlled.  Continue HCTZ.

## 2015-06-19 NOTE — Assessment & Plan Note (Signed)
+  1 pitting edema on exam.  No SOB, no evidence of DVT -Counseled on low salt diet. -Continue TED hose -Will consider referral to Dr Jenne Campus for nutrition -Return precautions reviewed. -Patient to schedule annual PE

## 2015-06-20 ENCOUNTER — Encounter: Payer: Self-pay | Admitting: Family Medicine

## 2015-06-28 ENCOUNTER — Encounter: Payer: BLUE CROSS/BLUE SHIELD | Admitting: Family Medicine

## 2015-07-20 ENCOUNTER — Other Ambulatory Visit: Payer: Self-pay

## 2015-07-20 DIAGNOSIS — Z1231 Encounter for screening mammogram for malignant neoplasm of breast: Secondary | ICD-10-CM

## 2015-07-24 ENCOUNTER — Other Ambulatory Visit (HOSPITAL_COMMUNITY)
Admission: RE | Admit: 2015-07-24 | Discharge: 2015-07-24 | Disposition: A | Discharge status: Home / Self Care | Payer: BLUE CROSS/BLUE SHIELD | Source: Ambulatory Visit | Attending: Family Medicine | Admitting: Family Medicine

## 2015-07-24 ENCOUNTER — Encounter: Payer: Self-pay | Admitting: Family Medicine

## 2015-07-24 ENCOUNTER — Ambulatory Visit (INDEPENDENT_AMBULATORY_CARE_PROVIDER_SITE_OTHER): Payer: BLUE CROSS/BLUE SHIELD | Admitting: Family Medicine

## 2015-07-24 VITALS — BP 140/77 | HR 85 | Temp 98.2°F | Ht 67.0 in | Wt 256.5 lb

## 2015-07-24 DIAGNOSIS — Z113 Encounter for screening for infections with a predominantly sexual mode of transmission: Secondary | ICD-10-CM | POA: Diagnosis present

## 2015-07-24 DIAGNOSIS — Z13220 Encounter for screening for lipoid disorders: Secondary | ICD-10-CM | POA: Diagnosis not present

## 2015-07-24 DIAGNOSIS — I10 Essential (primary) hypertension: Secondary | ICD-10-CM | POA: Diagnosis not present

## 2015-07-24 DIAGNOSIS — Z124 Encounter for screening for malignant neoplasm of cervix: Secondary | ICD-10-CM

## 2015-07-24 DIAGNOSIS — Z01419 Encounter for gynecological examination (general) (routine) without abnormal findings: Secondary | ICD-10-CM | POA: Diagnosis present

## 2015-07-24 DIAGNOSIS — G479 Sleep disorder, unspecified: Secondary | ICD-10-CM | POA: Diagnosis not present

## 2015-07-24 DIAGNOSIS — Z1151 Encounter for screening for human papillomavirus (HPV): Secondary | ICD-10-CM | POA: Insufficient documentation

## 2015-07-24 DIAGNOSIS — E669 Obesity, unspecified: Secondary | ICD-10-CM

## 2015-07-24 DIAGNOSIS — Z0000 Encounter for general adult medical examination without abnormal findings: Secondary | ICD-10-CM | POA: Diagnosis not present

## 2015-07-24 MED ORDER — TRAZODONE HCL 50 MG PO TABS: 50.0000 mg | ORAL_TABLET | Freq: Every evening | ORAL | Status: DC | PRN

## 2015-07-24 MED ORDER — HYDROCHLOROTHIAZIDE 25 MG PO TABS: 25.0000 mg | ORAL_TABLET | Freq: Every day | ORAL | Status: DC

## 2015-07-24 NOTE — Progress Notes (Signed)
Patient ID: Janet Woods, female   DOB: December 08, 1955, 59 y.o.   MRN: [id number]   Ms Walpole presents to office today for her annual physical examination.  Concerns today include:  Last eye exam: 3 years ago. Last dental exam: 1 year ago Last colonoscopy: 06/25/2011 Last mammogram: 08/21/2014 Last pap smear: 03/04/2012 Immunizations needed: Flu shot Refills needed today: HCTZ  Past Medical History  Diagnosis Date  . Hypertension   . Carpal tunnel syndrome    Social History   Social History  . Marital Status: Single    Spouse Name: N/A  . Number of Children: N/A  . Years of Education: N/A   Occupational History  . Not on file.   Social History Main Topics  . Smoking status: Never Smoker   . Smokeless tobacco: Never Used  . Alcohol Use: No  . Drug Use: No  . Sexual Activity: Not Currently    Birth Control/ Protection: None   Other Topics Concern  . Not on file   Social History Narrative   Lives with daughter with CP Alver Fisher age 17).  No long term relationship.  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   History reviewed. No pertinent past surgical history. Family History  Problem Relation Age of Onset  . Heart disease Mother 7    MI  . Cancer Sister 4    lung cancer  . Stroke Maternal Grandmother   . Diabetes Neg Hx   . Heart disease Maternal Grandfather   . Stroke Daughter     ROS: Review of Systems Constitutional: negative Eyes: negative Ears, nose, mouth, throat, and face: positive for poor dentition Respiratory: negative Cardiovascular: negative Gastrointestinal: negative Genitourinary:negative Integument/breast: negative Hematologic/lymphatic: negative Musculoskeletal:negative Neurological: negative Behavioral/Psych: negative Endocrine: negative Allergic/Immunologic: negative   Physical exam BP 140/77 mmHg  Pulse 85  Temp(Src) 98.2 F (36.8 C) (Oral)  Ht 5\' 7"  (1.702 m)  Wt 256 lb 8 oz (116.348 kg)  BMI 40.16 kg/m2 General  appearance: alert, cooperative, appears stated age and no distress Head: Normocephalic, without obvious abnormality, atraumatic Eyes: conjunctivae/corneas clear. PERRL, EOM's intact. Fundi benign. Ears: normal TM and external ear canal right ear and abnormal external canal left ear - not visualized secondary to cerumen Nose: Nares normal. Septum midline. Mucosa normal. No drainage or sinus tenderness. Throat: MMM, poor dentition with several teeth missing, o/p clear Neck: no adenopathy, no carotid bruit, no JVD, supple, symmetrical, trachea midline and thyroid not enlarged, symmetric, no tenderness/mass/nodules Back: symmetric, no curvature. ROM normal. No CVA tenderness. Lungs: clear to auscultation bilaterally Breasts: normal appearance, no masses or tenderness, not examined per patient request Heart: regular rate and rhythm, S1, S2 normal, no murmur, click, rub or gallop Abdomen: soft, non-tender; bowel sounds normal; no masses,  no organomegaly Pelvic: cervix normal in appearance, external genitalia normal, no adnexal masses or tenderness, no cervical motion tenderness, rectovaginal septum normal, uterus normal size, shape, and consistency and vagina normal without discharge Extremities: extremities normal, atraumatic, no cyanosis or edema Pulses: 2+ and symmetric Skin: Skin color, texture, turgor normal. No rashes or lesions Lymph nodes: Cervical, supraclavicular, and axillary nodes normal. Neurologic: Alert and oriented X 3, normal strength and tone. Normal symmetric reflexes. Normal coordination and gait    Assessment/ Plan: Patient with h/o HTN here for annual physical exam.   Screening for hyperlipidemia Lipid panel reviewed with patient.  HDL 45.  Goal >50. -Encouraged Diet and exercise  Essential hypertension Controlled.  June 2016 BMET reviewed.  No electrolyte abnormalities.  Cr normal. -Refilled HCTZ -Continue Diet and exercise   Obesity Patient to see Dr Jenne Campus for  weight management.   -Continue low sodium foods. -Patient to start walking daily  Sleep disorder, controlled - traZODone (DESYREL) 50 MG tablet; Take 1 tablet (50 mg total) by mouth at bedtime as needed for sleep.  Dispense: 90 tablet; Refill: 3  Pap smear for cervical cancer screening - Cytology - PAP w/ HPV co-testing - Will contact patient with results  Follow up in 1 year or sooner if needed.   Mallie Giambra M. Lajuana Ripple, DO PGY-2, [place]

## 2015-07-24 NOTE — Patient Instructions (Signed)
It was a pleasure seeing you today, Janet Woods.  Information regarding what we discussed is included in this packet.  Please make an appointment to see me in 1 year or sooner if needed.  Refills have been sent in for 1 year for your medications.  I will contact you will the results of your labs.  If anything is abnormal, I will call you.  Otherwise, expect a copy to be mailed to you.  Please get your mammogram as scheduled.  Please feel free to call our office at [phone] if any questions or concerns arise.  Warm Regards, Ashly M. Gottschalk, DO  Health Maintenance Adopting a healthy lifestyle and getting preventive care can go a long way to promote health and wellness. Talk with your health care provider about what schedule of regular examinations is right for you. This is a good chance for you to check in with your provider about disease prevention and staying healthy. In between checkups, there are plenty of things you can do on your own. Experts have done a lot of research about which lifestyle changes and preventive measures are most likely to keep you healthy. Ask your health care provider for more information. WEIGHT AND DIET  Eat a healthy diet  Be sure to include plenty of vegetables, fruits, low-fat dairy products, and lean protein.  Do not eat a lot of foods high in solid fats, added sugars, or salt.  Get regular exercise. This is one of the most important things you can do for your health.  Most adults should exercise for at least 150 minutes each week. The exercise should increase your heart rate and make you sweat (moderate-intensity exercise).  Most adults should also do strengthening exercises at least twice a week. This is in addition to the moderate-intensity exercise.  Maintain a healthy weight  Body mass index (BMI) is a measurement that can be used to identify possible weight problems. It estimates body fat based on height and weight. Your health care provider can  help determine your BMI and help you achieve or maintain a healthy weight.  For females 79 years of age and older:   A BMI below 18.5 is considered underweight.  A BMI of 18.5 to 24.9 is normal.  A BMI of 25 to 29.9 is considered overweight.  A BMI of 30 and above is considered obese.  Watch levels of cholesterol and blood lipids  You should start having your blood tested for lipids and cholesterol at 57 years of age, then have this test every 5 years.  You may need to have your cholesterol levels checked more often if:  Your lipid or cholesterol levels are high.  You are older than 57 years of age.  You are at high risk for heart disease.  CANCER SCREENING   Lung Cancer  Lung cancer screening is recommended for adults 13-45 years old who are at high risk for lung cancer because of a history of smoking.  A yearly low-dose CT scan of the lungs is recommended for people who:  Currently smoke.  Have quit within the past 15 years.  Have at least a 30-pack-year history of smoking. A pack year is smoking an average of one pack of cigarettes a day for 1 year.  Yearly screening should continue until it has been 15 years since you quit.  Yearly screening should stop if you develop a health problem that would prevent you from having lung cancer treatment.  Breast Cancer  Practice  breast self-awareness. This means understanding how your breasts normally appear and feel.  It also means doing regular breast self-exams. Let your health care provider know about any changes, no matter how small.  If you are in your 20s or 30s, you should have a clinical breast exam (CBE) by a health care provider every 1-3 years as part of a regular health exam.  If you are 91 or older, have a CBE every year. Also consider having a breast X-ray (mammogram) every year.  If you have a family history of breast cancer, talk to your health care provider about genetic screening.  If you are at high  risk for breast cancer, talk to your health care provider about having an MRI and a mammogram every year.  Breast cancer gene (BRCA) assessment is recommended for women who have family members with BRCA-related cancers. BRCA-related cancers include:  Breast.  Ovarian.  Tubal.  Peritoneal cancers.  Results of the assessment will determine the need for genetic counseling and BRCA1 and BRCA2 testing. Cervical Cancer Routine pelvic examinations to screen for cervical cancer are no longer recommended for nonpregnant women who are considered low risk for cancer of the pelvic organs (ovaries, uterus, and vagina) and who do not have symptoms. A pelvic examination may be necessary if you have symptoms including those associated with pelvic infections. Ask your health care provider if a screening pelvic exam is right for you.   The Pap test is the screening test for cervical cancer for women who are considered at risk.  If you had a hysterectomy for a problem that was not cancer or a condition that could lead to cancer, then you no longer need Pap tests.  If you are older than 65 years, and you have had normal Pap tests for the past 10 years, you no longer need to have Pap tests.  If you have had past treatment for cervical cancer or a condition that could lead to cancer, you need Pap tests and screening for cancer for at least 20 years after your treatment.  If you no longer get a Pap test, assess your risk factors if they change (such as having a new sexual partner). This can affect whether you should start being screened again.  Some women have medical problems that increase their chance of getting cervical cancer. If this is the case for you, your health care provider may recommend more frequent screening and Pap tests.  The human papillomavirus (HPV) test is another test that may be used for cervical cancer screening. The HPV test looks for the virus that can cause cell changes in the cervix.  The cells collected during the Pap test can be tested for HPV.  The HPV test can be used to screen women 20 years of age and older. Getting tested for HPV can extend the interval between normal Pap tests from three to five years.  An HPV test also should be used to screen women of any age who have unclear Pap test results.  After 57 years of age, women should have HPV testing as often as Pap tests.  Colorectal Cancer  This type of cancer can be detected and often prevented.  Routine colorectal cancer screening usually begins at 57 years of age and continues through 57 years of age.  Your health care provider may recommend screening at an earlier age if you have risk factors for colon cancer.  Your health care provider may also recommend using home test kits to  check for hidden blood in the stool.  A small camera at the end of a tube can be used to examine your colon directly (sigmoidoscopy or colonoscopy). This is done to check for the earliest forms of colorectal cancer.  Routine screening usually begins at age 28.  Direct examination of the colon should be repeated every 5-10 years through 57 years of age. However, you may need to be screened more often if early forms of precancerous polyps or small growths are found. Skin Cancer  Check your skin from head to toe regularly.  Tell your health care provider about any new moles or changes in moles, especially if there is a change in a mole's shape or color.  Also tell your health care provider if you have a mole that is larger than the size of a pencil eraser.  Always use sunscreen. Apply sunscreen liberally and repeatedly throughout the day.  Protect yourself by wearing long sleeves, pants, a wide-brimmed hat, and sunglasses whenever you are outside. HEART DISEASE, DIABETES, AND HIGH BLOOD PRESSURE   Have your blood pressure checked at least every 1-2 years. High blood pressure causes heart disease and increases the risk of  stroke.  If you are between 62 years and 3 years old, ask your health care provider if you should take aspirin to prevent strokes.  Have regular diabetes screenings. This involves taking a blood sample to check your fasting blood sugar level.  If you are at a normal weight and have a low risk for diabetes, have this test once every three years after 57 years of age.  If you are overweight and have a high risk for diabetes, consider being tested at a younger age or more often. PREVENTING INFECTION  Hepatitis B  If you have a higher risk for hepatitis B, you should be screened for this virus. You are considered at high risk for hepatitis B if:  You were born in a country where hepatitis B is common. Ask your health care provider which countries are considered high risk.  Your parents were born in a high-risk country, and you have not been immunized against hepatitis B (hepatitis B vaccine).  You have HIV or AIDS.  You use needles to inject street drugs.  You live with someone who has hepatitis B.  You have had sex with someone who has hepatitis B.  You get hemodialysis treatment.  You take certain medicines for conditions, including cancer, organ transplantation, and autoimmune conditions. Hepatitis C  Blood testing is recommended for:  Everyone born from [birth year] through 1963.  Anyone with known risk factors for hepatitis C. Sexually transmitted infections (STIs)  You should be screened for sexually transmitted infections (STIs) including gonorrhea and chlamydia if:  You are sexually active and are younger than 57 years of age.  You are older than 57 years of age and your health care provider tells you that you are at risk for this type of infection.  Your sexual activity has changed since you were last screened and you are at an increased risk for chlamydia or gonorrhea. Ask your health care provider if you are at risk.  If you do not have HIV, but are at risk, it may be  recommended that you take a prescription medicine daily to prevent HIV infection. This is called pre-exposure prophylaxis (PrEP). You are considered at risk if:  You are sexually active and do not regularly use condoms or know the HIV status of your partner(s).  You take  drugs by injection.  You are sexually active with a partner who has HIV. Talk with your health care provider about whether you are at high risk of being infected with HIV. If you choose to begin PrEP, you should first be tested for HIV. You should then be tested every 3 months for as long as you are taking PrEP.  PREGNANCY   If you are premenopausal and you may become pregnant, ask your health care provider about preconception counseling.  If you may become pregnant, take 400 to 800 micrograms (mcg) of folic acid every day.  If you want to prevent pregnancy, talk to your health care provider about birth control (contraception). OSTEOPOROSIS AND MENOPAUSE   Osteoporosis is a disease in which the bones lose minerals and strength with aging. This can result in serious bone fractures. Your risk for osteoporosis can be identified using a bone density scan.  If you are 48 years of age or older, or if you are at risk for osteoporosis and fractures, ask your health care provider if you should be screened.  Ask your health care provider whether you should take a calcium or vitamin D supplement to lower your risk for osteoporosis.  Menopause may have certain physical symptoms and risks.  Hormone replacement therapy may reduce some of these symptoms and risks. Talk to your health care provider about whether hormone replacement therapy is right for you.  HOME CARE INSTRUCTIONS   Schedule regular health, dental, and eye exams.  Stay current with your immunizations.   Do not use any tobacco products including cigarettes, chewing tobacco, or electronic cigarettes.  If you are pregnant, do not drink alcohol.  If you are  breastfeeding, limit how much and how often you drink alcohol.  Limit alcohol intake to no more than 1 drink per day for nonpregnant women. One drink equals 12 ounces of beer, 5 ounces of wine, or 1 ounces of hard liquor.  Do not use street drugs.  Do not share needles.  Ask your health care provider for help if you need support or information about quitting drugs.  Tell your health care provider if you often feel depressed.  Tell your health care provider if you have ever been abused or do not feel safe at home. Document Released: 05/19/2011 Document Revised: 03/20/2014 Document Reviewed: 10/05/2013 [facility] Patient Information 2015 [place], [place]. This information is not intended to replace advice given to you by your health care provider. Make sure you discuss any questions you have with your health care provider.

## 2015-07-24 NOTE — Assessment & Plan Note (Signed)
Patient to see Dr Jenne Campus for weight management.   -Continue low sodium foods. -Patient to start walking daily

## 2015-07-24 NOTE — Assessment & Plan Note (Addendum)
Lipid panel reviewed with patient.  HDL 45.  Goal >50. -Encouraged Diet and exercise

## 2015-07-24 NOTE — Assessment & Plan Note (Addendum)
Controlled.  June 2016 BMET reviewed.  No electrolyte abnormalities.  Cr normal. -Refilled HCTZ -Continue Diet and exercise

## 2015-07-26 ENCOUNTER — Encounter: Payer: Self-pay | Admitting: Family Medicine

## 2015-07-26 LAB — CYTOLOGY - PAP

## 2015-08-27 ENCOUNTER — Ambulatory Visit
Admission: RE | Admit: 2015-08-27 | Discharge: 2015-08-27 | Disposition: A | Discharge status: Home / Self Care | Payer: BLUE CROSS/BLUE SHIELD | Source: Ambulatory Visit

## 2015-08-27 DIAGNOSIS — Z1231 Encounter for screening mammogram for malignant neoplasm of breast: Secondary | ICD-10-CM

## 2015-11-07 ENCOUNTER — Emergency Department (HOSPITAL_COMMUNITY): Payer: BLUE CROSS/BLUE SHIELD

## 2015-11-07 ENCOUNTER — Emergency Department (HOSPITAL_COMMUNITY)
Admission: EM | Admit: 2015-11-07 | Discharge: 2015-11-07 | Disposition: A | Discharge status: Home / Self Care | Payer: BLUE CROSS/BLUE SHIELD | Attending: Emergency Medicine | Admitting: Emergency Medicine

## 2015-11-07 ENCOUNTER — Emergency Department (EMERGENCY_DEPARTMENT_HOSPITAL)
Admit: 2015-11-07 | Discharge: 2015-11-07 | Disposition: A | Discharge status: Home / Self Care | Payer: BLUE CROSS/BLUE SHIELD | Attending: Emergency Medicine | Admitting: Emergency Medicine

## 2015-11-07 ENCOUNTER — Encounter (HOSPITAL_COMMUNITY): Payer: Self-pay | Admitting: *Deleted

## 2015-11-07 DIAGNOSIS — R2242 Localized swelling, mass and lump, left lower limb: Secondary | ICD-10-CM | POA: Diagnosis present

## 2015-11-07 DIAGNOSIS — R609 Edema, unspecified: Secondary | ICD-10-CM | POA: Diagnosis not present

## 2015-11-07 DIAGNOSIS — Z79899 Other long term (current) drug therapy: Secondary | ICD-10-CM | POA: Diagnosis not present

## 2015-11-07 DIAGNOSIS — I10 Essential (primary) hypertension: Secondary | ICD-10-CM | POA: Insufficient documentation

## 2015-11-07 DIAGNOSIS — R0602 Shortness of breath: Secondary | ICD-10-CM | POA: Insufficient documentation

## 2015-11-07 DIAGNOSIS — Z8669 Personal history of other diseases of the nervous system and sense organs: Secondary | ICD-10-CM | POA: Insufficient documentation

## 2015-11-07 DIAGNOSIS — R062 Wheezing: Secondary | ICD-10-CM | POA: Insufficient documentation

## 2015-11-07 LAB — CBC
HCT: 39.2 % (ref 36.0–46.0)
HEMOGLOBIN: 12.6 g/dL (ref 12.0–15.0)
MCH: 28.3 pg (ref 26.0–34.0)
MCHC: 32.1 g/dL (ref 30.0–36.0)
MCV: 87.9 fL (ref 78.0–100.0)
Platelets: 290 10*3/uL (ref 150–400)
RBC: 4.46 MIL/uL (ref 3.87–5.11)
RDW: 15 % (ref 11.5–15.5)
WBC: 8.6 10*3/uL (ref 4.0–10.5)

## 2015-11-07 LAB — COMPREHENSIVE METABOLIC PANEL
ALT: 20 U/L (ref 14–54)
AST: 27 U/L (ref 15–41)
Albumin: 3.7 g/dL (ref 3.5–5.0)
Alkaline Phosphatase: 67 U/L (ref 38–126)
Anion gap: 9 (ref 5–15)
BUN: 8 mg/dL (ref 6–20)
CHLORIDE: 105 mmol/L (ref 101–111)
CO2: 29 mmol/L (ref 22–32)
CREATININE: 0.71 mg/dL (ref 0.44–1.00)
Calcium: 9 mg/dL (ref 8.9–10.3)
Glucose, Bld: 125 mg/dL — ABNORMAL HIGH (ref 65–99)
POTASSIUM: 3.7 mmol/L (ref 3.5–5.1)
SODIUM: 143 mmol/L (ref 135–145)
Total Bilirubin: 0.4 mg/dL (ref 0.3–1.2)
Total Protein: 8 g/dL (ref 6.5–8.1)

## 2015-11-07 LAB — BRAIN NATRIURETIC PEPTIDE: B Natriuretic Peptide: 19.1 pg/mL (ref 0.0–100.0)

## 2015-11-07 LAB — I-STAT TROPONIN, ED: TROPONIN I, POC: 0 ng/mL (ref 0.00–0.08)

## 2015-11-07 LAB — D-DIMER, QUANTITATIVE (NOT AT ARMC)

## 2015-11-07 MED ORDER — FUROSEMIDE 20 MG PO TABS: 20.0000 mg | ORAL_TABLET | Freq: Every day | ORAL | Status: DC

## 2015-11-07 NOTE — ED Notes (Signed)
Pt reports SOB, lower leg swelling, and dyspnea laying flat. Able to speak in full sentences. Denies pain.

## 2015-11-07 NOTE — ED Provider Notes (Signed)
CSN: [id number]     Arrival date & time 11/07/15  1027 History   First MD Initiated Contact with Patient 11/07/15 1103     Chief Complaint  Patient presents with  . Shortness of Breath  . Leg Swelling   Patient is a 59 y.o. female presenting with shortness of breath. The history is provided by the patient.  Shortness of Breath Severity:  Moderate Onset quality:  Gradual Duration:  3 days Timing:  Constant Progression:  Worsening Chronicity:  New Context: activity   Relieved by:  Nothing Exacerbated by: lying flat. Ineffective treatments:  None tried Associated symptoms: wheezing   Associated symptoms: no abdominal pain, no fever and no vomiting   Associated symptoms comment:  Legs fell swollen, she thinks she may be retaining fluid   Past Medical History  Diagnosis Date  . Hypertension   . Carpal tunnel syndrome    History reviewed. No pertinent past surgical history. Family History  Problem Relation Age of Onset  . Heart disease Mother 50    MI  . Cancer Sister 55    lung cancer  . Stroke Maternal Grandmother   . Diabetes Neg Hx   . Heart disease Maternal Grandfather   . Stroke Daughter    Social History  Substance Use Topics  . Smoking status: Never Smoker   . Smokeless tobacco: Never Used  . Alcohol Use: No   OB History    No data available     Review of Systems  Constitutional: Negative for fever.  Respiratory: Positive for shortness of breath and wheezing.   Gastrointestinal: Negative for vomiting and abdominal pain.  All other systems reviewed and are negative.     Allergies  Review of patient's allergies indicates no known allergies.  Home Medications   Prior to Admission medications   Medication Sig Start Date End Date Taking? Authorizing Provider  hydrochlorothiazide (HYDRODIURIL) 25 MG tablet Take 1 tablet (25 mg total) by mouth daily. 07/24/15  Yes Ashly M Gottschalk, DO  naproxen (NAPROSYN) 500 MG tablet Take 1 tablet (500 mg total) by  mouth 2 (two) times daily as needed for mild pain. 06/19/15  Yes Ashly M Gottschalk, DO  traZODone (DESYREL) 50 MG tablet Take 1 tablet (50 mg total) by mouth at bedtime as needed for sleep. 07/24/15  Yes Ashly Windell Moulding, DO  furosemide (LASIX) 20 MG tablet Take 1 tablet (20 mg total) by mouth daily. 11/07/15   Dorie Rank, MD   BP 154/87 mmHg  Pulse 86  Temp(Src) 98.7 F (37.1 C) (Oral)  Resp 16  SpO2 100% Physical Exam  Constitutional: She appears well-developed and well-nourished. No distress.  HENT:  Head: Normocephalic and atraumatic.  Right Ear: External ear normal.  Left Ear: External ear normal.  Eyes: Conjunctivae are normal. Right eye exhibits no discharge. Left eye exhibits no discharge. No scleral icterus.  Neck: Neck supple. No tracheal deviation present.  Cardiovascular: Normal rate, regular rhythm and intact distal pulses.   Pulmonary/Chest: Effort normal and breath sounds normal. No stridor. No respiratory distress. She has no wheezes. She has no rales.  Abdominal: Soft. Bowel sounds are normal. She exhibits no distension. There is no tenderness. There is no rebound and no guarding.  Musculoskeletal: She exhibits edema and tenderness.  Mild edema and tenderness to palpation left calf  Neurological: She is alert. She has normal strength. No cranial nerve deficit (no facial droop, extraocular movements intact, no slurred speech) or sensory deficit. She exhibits normal muscle tone.  She displays no seizure activity. Coordination normal.  Skin: Skin is warm and dry. No rash noted.  Psychiatric: She has a normal mood and affect.  Nursing note and vitals reviewed.   ED Course  Procedures (including critical care time) Labs Review Labs Reviewed  COMPREHENSIVE METABOLIC PANEL - Abnormal; Notable for the following:    Glucose, Bld 125 (*)    All other components within normal limits  CBC  D-DIMER, QUANTITATIVE (NOT AT [facility])  BRAIN NATRIURETIC PEPTIDE  I-STAT TROPOININ, ED     Imaging Review Dg Chest 2 View  11/07/2015  CLINICAL DATA:  Shortness of breath.  Hypertension. EXAM: CHEST  2 VIEW COMPARISON:  May 14, 2014 chest radiograph; May 14, 2014 chest CT FINDINGS: There is no edema or consolidation. The heart size and pulmonary vascularity are within normal limits. No adenopathy. No bone lesions. There is upper thoracic levoscoliosis and mild lower thoracic dextroscoliosis. IMPRESSION: No edema or consolidation. Electronically Signed   By: Lowella Grip III M.D.   On: 11/07/2015 11:36   I have personally reviewed and evaluated these images and lab results as part of my medical decision-making.   EKG Interpretation   Date/Time:  Wednesday November 07 2015 10:43:52 EST Ventricular Rate:  81 PR Interval:  169 QRS Duration: 93 QT Interval:  382 QTC Calculation: 443 R Axis:   6 Text Interpretation:  Sinus rhythm Baseline wander in lead(s) V3 V5 Since  last tracing rate slower Confirmed by Lothar Prehn  MD-J, Clois Montavon [id number]) on  11/07/2015 11:13:54 AM      MDM   Final diagnoses:  Swelling   Patient's laboratory tests are reassuring. She has a normal BNP, troponin and d-dimer. Chest x-ray is normal. I doubt pulmonary embolism, congestive heart failure or pneumonia. A Doppler study of her lower extremity was performed and the preliminary result does not show any evidence of DVT.  Patient does have some mild edema. I will discharge her home on a few days of Lasix. Otherwise some of her respiratory symptoms may be related to a viral respiratory illness.  At this time there does not appear to be any evidence of an acute emergency medical condition and the patient appears stable for discharge with appropriate outpatient follow up.      Dorie Rank, MD 11/07/15 [phone]

## 2015-11-07 NOTE — ED Notes (Signed)
Nurse drawing labs. 

## 2015-11-07 NOTE — Discharge Instructions (Signed)
Edema °Edema is an abnormal buildup of fluids in your body tissues. Edema is somewhat dependent on gravity to pull the fluid to the lowest place in your body. That makes the condition more common in the legs and thighs (lower extremities). Painless swelling of the feet and ankles is common and becomes more likely as you get older. It is also common in looser tissues, like around your eyes.  °When the affected area is squeezed, the fluid may move out of that spot and leave a dent for a few moments. This dent is called pitting.  °CAUSES  °There are many possible causes of edema. Eating too much salt and being on your feet or sitting for a long time can cause edema in your legs and ankles. Hot weather may make edema worse. Common medical causes of edema include: °· Heart failure. °· Liver disease. °· Kidney disease. °· Weak blood vessels in your legs. °· Cancer. °· An injury. °· Pregnancy. °· Some medications. °· Obesity.  °SYMPTOMS  °Edema is usually painless. Your skin may look swollen or shiny.  °DIAGNOSIS  °Your health care provider may be able to diagnose edema by asking about your medical history and doing a physical exam. You may need to have tests such as X-rays, an electrocardiogram, or blood tests to check for medical conditions that may cause edema.  °TREATMENT  °Edema treatment depends on the cause. If you have heart, liver, or kidney disease, you need the treatment appropriate for these conditions. General treatment may include: °· Elevation of the affected body part above the level of your heart. °· Compression of the affected body part. Pressure from elastic bandages or support stockings squeezes the tissues and forces fluid back into the blood vessels. This keeps fluid from entering the tissues. °· Restriction of fluid and salt intake. °· Use of a water pill (diuretic). These medications are appropriate only for some types of edema. They pull fluid out of your body and make you urinate more often. This  gets rid of fluid and reduces swelling, but diuretics can have side effects. Only use diuretics as directed by your health care provider. °HOME CARE INSTRUCTIONS  °· Keep the affected body part above the level of your heart when you are lying down.   °· Do not sit still or stand for prolonged periods.   °· Do not put anything directly under your knees when lying down. °· Do not wear constricting clothing or garters on your upper legs.   °· Exercise your legs to work the fluid back into your blood vessels. This may help the swelling go down.   °· Wear elastic bandages or support stockings to reduce ankle swelling as directed by your health care provider.   °· Eat a low-salt diet to reduce fluid if your health care provider recommends it.   °· Only take medicines as directed by your health care provider.  °SEEK MEDICAL CARE IF:  °· Your edema is not responding to treatment. °· You have heart, liver, or kidney disease and notice symptoms of edema. °· You have edema in your legs that does not improve after elevating them.   °· You have sudden and unexplained weight gain. °SEEK IMMEDIATE MEDICAL CARE IF:  °· You develop shortness of breath or chest pain.   °· You cannot breathe when you lie down. °· You develop pain, redness, or warmth in the swollen areas.   °· You have heart, liver, or kidney disease and suddenly get edema. °· You have a fever and your symptoms suddenly get worse. °MAKE SURE YOU:  °·   Understand these instructions. °· Will watch your condition. °· Will get help right away if you are not doing well or get worse. °  °This information is not intended to replace advice given to you by your health care provider. Make sure you discuss any questions you have with your health care provider. °  °Document Released: 11/03/2005 Document Revised: 11/24/2014 Document Reviewed: 08/26/2013 °Elsevier Interactive Patient Education ©2016 [employer] ° °

## 2015-11-07 NOTE — Progress Notes (Signed)
*  Preliminary Results* Left lower extremity venous duplex completed. Study was very technically difficult and limited due to patient body habitus and depth of vessels. Visualized veins of the left lower extremity are negative for deep vein thrombosis. There is no evidence of left Baker's cyst.  11/07/2015 1:41 PM  Maudry Mayhew, RVT, RDCS, RDMS

## 2016-04-09 ENCOUNTER — Telehealth: Payer: Self-pay | Admitting: Family Medicine

## 2016-04-09 NOTE — Telephone Encounter (Signed)
Pt brought in FMLA paperwork to be filled out. Her daughter is Alver Fisher.   Please call when ready for pickup  Cell phone [phone]

## 2016-04-09 NOTE — Telephone Encounter (Signed)
According to pt daughters chart this has been taken care of. Closing encounter. Katharina Caper, Gilliam Hawkes D, CMA d

## 2016-07-30 ENCOUNTER — Other Ambulatory Visit: Payer: Self-pay | Admitting: Family Medicine

## 2016-07-30 DIAGNOSIS — G479 Sleep disorder, unspecified: Secondary | ICD-10-CM

## 2016-07-30 DIAGNOSIS — G5603 Carpal tunnel syndrome, bilateral upper limbs: Secondary | ICD-10-CM

## 2016-07-30 DIAGNOSIS — I10 Essential (primary) hypertension: Secondary | ICD-10-CM

## 2016-07-30 NOTE — Telephone Encounter (Signed)
Please have patient schedule an appointment for annual exam.  It has been a year since she was last seen.  3 month supply of meds sent to pharmacy.

## 2016-08-05 NOTE — Telephone Encounter (Signed)
LMOVM for pt to return call. [place] Mielke Dawn, CMA  

## 2016-08-18 ENCOUNTER — Encounter (HOSPITAL_COMMUNITY): Payer: Self-pay | Admitting: Emergency Medicine

## 2016-08-18 ENCOUNTER — Emergency Department (HOSPITAL_COMMUNITY)
Admission: EM | Admit: 2016-08-18 | Discharge: 2016-08-18 | Disposition: A | Discharge status: Home / Self Care | Payer: No Typology Code available for payment source | Attending: Emergency Medicine | Admitting: Emergency Medicine

## 2016-08-18 DIAGNOSIS — I10 Essential (primary) hypertension: Secondary | ICD-10-CM | POA: Insufficient documentation

## 2016-08-18 DIAGNOSIS — S161XXA Strain of muscle, fascia and tendon at neck level, initial encounter: Secondary | ICD-10-CM | POA: Diagnosis not present

## 2016-08-18 DIAGNOSIS — Y92410 Unspecified street and highway as the place of occurrence of the external cause: Secondary | ICD-10-CM | POA: Insufficient documentation

## 2016-08-18 DIAGNOSIS — Z79899 Other long term (current) drug therapy: Secondary | ICD-10-CM | POA: Diagnosis not present

## 2016-08-18 DIAGNOSIS — Y939 Activity, unspecified: Secondary | ICD-10-CM | POA: Insufficient documentation

## 2016-08-18 DIAGNOSIS — Y999 Unspecified external cause status: Secondary | ICD-10-CM | POA: Insufficient documentation

## 2016-08-18 DIAGNOSIS — S199XXA Unspecified injury of neck, initial encounter: Secondary | ICD-10-CM | POA: Diagnosis present

## 2016-08-18 MED ORDER — METHOCARBAMOL 500 MG PO TABS: 500.0000 mg | ORAL_TABLET | Freq: Three times a day (TID) | ORAL | 0 refills | Status: DC | PRN
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
## 2016-08-24 ENCOUNTER — Emergency Department (HOSPITAL_COMMUNITY): Payer: Self-pay

## 2016-08-24 ENCOUNTER — Emergency Department (HOSPITAL_COMMUNITY)
Admission: EM | Admit: 2016-08-24 | Discharge: 2016-08-24 | Disposition: A | Discharge status: Home / Self Care | Payer: Self-pay | Attending: Emergency Medicine | Admitting: Emergency Medicine

## 2016-08-24 DIAGNOSIS — M25511 Pain in right shoulder: Secondary | ICD-10-CM | POA: Insufficient documentation

## 2016-08-24 DIAGNOSIS — I10 Essential (primary) hypertension: Secondary | ICD-10-CM | POA: Insufficient documentation

## 2016-08-24 NOTE — Discharge Instructions (Signed)
Your pain is likely tendonitis.  Your xray is normal today.  Continue taking Naproxen, Robaxin, and icing your shoulder.  Follow up with orthopedics.

## 2016-08-24 NOTE — ED Provider Notes (Signed)
[facility] DEPT Provider Note   CSN: [id number] Arrival date & time: 08/24/16  1322  By signing my name below, I, Arianna Nassar, attest that this documentation has been prepared under the direction and in the presence of [employer], PA-C.  Electronically Signed: Julien Nordmann, ED Scribe. 08/24/16. 2:58 PM.    History   Chief Complaint Chief Complaint  Patient presents with  . Shoulder Pain    The history is provided by the patient. No language interpreter was used.   HPI Comments: Janet Woods is a 60 y.o. female who has a Pmhx of carpal tunnel syndrome presents to the Emergency Department complaining of sudden onset, gradual worsening, moderate right shoulder pain that started this morning ~ 10:30 am. Pt says the pain radiates into the right side of her neck. She further reports having a "knot" to her right shoulder. Pt had difficulty moving and lifting her right arm secondary to pain. She denies unilateral weakness, slurred speech, facial droop, chest pain or shortness of breath, diaphoresis, N/V.  Past Medical History:  Diagnosis Date  . Carpal tunnel syndrome   . Hypertension     Patient Active Problem List   Diagnosis Date Noted  . Leg swelling 05/11/2015  . Adjustment disorder with mixed anxiety and depressed mood 07/25/2014  . Memory loss of unknown cause 01/23/2014  . Screening for hyperlipidemia 01/23/2014  . Unspecified vitamin D deficiency 03/17/2013  . GERD (gastroesophageal reflux disease) 07/15/2012  . Carpal tunnel syndrome, bilateral 01/02/2012  . Obesity 03/09/2009  . Essential hypertension 01/17/2009    No past surgical history on file.  OB History    No data available       Home Medications    Prior to Admission medications   Medication Sig Start Date End Date Taking? Authorizing Provider  furosemide (LASIX) 20 MG tablet Take 1 tablet (20 mg total) by mouth daily. Patient taking differently: Take 20 mg by mouth daily as needed for  fluid.  11/07/15   Dorie Rank, MD  hydrochlorothiazide (HYDRODIURIL) 25 MG tablet take 1 tablet by mouth once daily 07/30/16   [name], DO  Menthol, Topical Analgesic, (BIOFREEZE) 10 % LIQD Apply 1 application topically 2 (two) times daily as needed (for shoulder pain).    Historical Provider, MD  Menthol, Topical Analgesic, (MINERAL ICE) 2 % GEL Apply 1 application topically daily as needed (for shoulder pain).    Historical Provider, MD  methocarbamol (ROBAXIN) 500 MG tablet Take 1 tablet (500 mg total) by mouth every 8 (eight) hours as needed for muscle spasms. 08/18/16   Jola Schmidt, MD  naproxen (NAPROSYN) 500 MG tablet take 1 tablet by mouth twice a day if needed for MILD PAIN 07/30/16   [name], DO  traZODone (DESYREL) 50 MG tablet take 1 tablet by mouth at bedtime if needed for sleep 07/30/16   [name], DO    Family History Family History  Problem Relation Age of Onset  . Heart disease Mother 36    MI  . Cancer Sister 31    lung cancer  . Stroke Daughter   . Stroke Maternal Grandmother   . Heart disease Maternal Grandfather   . Diabetes Neg Hx     Social History Social History  Substance Use Topics  . Smoking status: Never Smoker  . Smokeless tobacco: Never Used  . Alcohol use No     Allergies   Review of patient's allergies indicates no known allergies.   Review  of Systems Review of Systems  Respiratory: Negative for shortness of breath.   Cardiovascular: Negative for chest pain.  Musculoskeletal: Positive for arthralgias.  Neurological: Negative for weakness.  All other systems reviewed and are negative.    Physical Exam Updated Vital Signs BP 135/77 (BP Location: Right Arm)   Pulse 77   Temp 98.3 F (36.8 C) (Oral)   Resp 18   Ht 5\' 7"  (1.702 m)   Wt 112.5 kg   SpO2 98%   BMI 38.84 kg/m   Physical Exam  Constitutional: She is oriented to person, place, and time. She appears well-developed and well-nourished.  HENT:    Head: Normocephalic and atraumatic.  Right Ear: External ear normal.  Left Ear: External ear normal.  Eyes: Conjunctivae are normal. No scleral icterus.  Neck: No tracheal deviation present.  Pulmonary/Chest: Effort normal. No respiratory distress.  Abdominal: She exhibits no distension.  Musculoskeletal: Normal range of motion. She exhibits tenderness.  Right trapezius TTP.  Right shoulder without swelling, erythema, warmth, or crepitus.  TTP over suprascapular region and deltoid.  No bony tenderness.  Decreased ROM due to pain.  Neurological: She is alert and oriented to person, place, and time.  Sensation intact throughout bilateral upper extremities.  5/5 strength throughout bilateral upper extremities aside from right shoulder, 4/5, due to pain.  Skin: Skin is warm and dry.  Deep mobile, soft, non-tender mass over right trapezius without overlying infection.   Psychiatric: She has a normal mood and affect. Her behavior is normal.  Nursing note and vitals reviewed.    ED Treatments / Results  DIAGNOSTIC STUDIES: Oxygen Saturation is 96% on RA, normal by my interpretation.  COORDINATION OF CARE:  2:55 PM Discussed treatment plan with pt at bedside and pt agreed to plan.  Labs (all labs ordered are listed, but only abnormal results are displayed) Labs Reviewed - No data to display  EKG  EKG Interpretation None       Radiology Dg Shoulder Right  Result Date: 08/24/2016 CLINICAL DATA:  Right shoulder pain and limited range of motion. No known injury. EXAM: RIGHT SHOULDER - 2+ VIEW COMPARISON:  None. FINDINGS: Humeral head is properly located. No osteoarthritis is seen. Normal humeral acromial distance. AC joint is unremarkable. IMPRESSION: Negative plain radiographs Electronically Signed   By: Nelson Chimes M.D.   On: 08/24/2016 15:26    Procedures Procedures (including critical care time)  Medications Ordered in ED Medications - No data to display   Initial  Impression / Assessment and Plan / ED Course  I have reviewed the triage vital signs and the nursing notes.  Pertinent labs & imaging results that were available during my care of the patient were reviewed by me and considered in my medical decision making (see chart for details).  Clinical Course   Patient X-Ray negative for obvious fracture or dislocation.  Likely tendonitis.  Pt advised to follow up with orthopedics. Conservative therapy recommended and discussed. Patient will be discharged home & is agreeable with above plan. Returns precautions discussed. Follow up orthopedics.  Pt appears safe for discharge.  I personally performed the services described in this documentation, which was scribed in my presence. The recorded information has been reviewed and is accurate.  Final Clinical Impressions(s) / ED Diagnoses   Final diagnoses:  Acute pain of right shoulder    New Prescriptions New Prescriptions   No medications on file     Gloriann Loan, Hershal Coria 08/24/16 [place],  MD 08/24/16 2330

## 2016-08-24 NOTE — ED Triage Notes (Signed)
Pt c/o pain to right shoulder, subcutaneous "knot" to right shoulder, decreased ROM right arm, pain with moving right arm onset 1030 today. Difficulty moving right arm due to pain. Some numbness to right hand, hx of carpal tunnel. No other symptoms. No injury.

## 2016-08-28 ENCOUNTER — Other Ambulatory Visit: Payer: Self-pay | Admitting: Family Medicine

## 2016-08-28 DIAGNOSIS — Z1231 Encounter for screening mammogram for malignant neoplasm of breast: Secondary | ICD-10-CM

## 2016-09-11 ENCOUNTER — Encounter: Payer: Self-pay | Admitting: Family Medicine

## 2016-09-11 ENCOUNTER — Ambulatory Visit (INDEPENDENT_AMBULATORY_CARE_PROVIDER_SITE_OTHER): Payer: PRIVATE HEALTH INSURANCE | Admitting: Family Medicine

## 2016-09-11 VITALS — BP 124/73 | HR 81 | Temp 97.8°F | Ht 67.0 in | Wt 253.4 lb

## 2016-09-11 DIAGNOSIS — E559 Vitamin D deficiency, unspecified: Secondary | ICD-10-CM

## 2016-09-11 DIAGNOSIS — Z0000 Encounter for general adult medical examination without abnormal findings: Secondary | ICD-10-CM

## 2016-09-11 DIAGNOSIS — G5603 Carpal tunnel syndrome, bilateral upper limbs: Secondary | ICD-10-CM

## 2016-09-11 DIAGNOSIS — I10 Essential (primary) hypertension: Secondary | ICD-10-CM

## 2016-09-11 DIAGNOSIS — Z23 Encounter for immunization: Secondary | ICD-10-CM

## 2016-09-11 DIAGNOSIS — E6609 Other obesity due to excess calories: Secondary | ICD-10-CM

## 2016-09-11 DIAGNOSIS — Z6839 Body mass index (BMI) 39.0-39.9, adult: Secondary | ICD-10-CM

## 2016-09-11 LAB — BASIC METABOLIC PANEL
BUN: 10 mg/dL (ref 7–25)
CHLORIDE: 102 mmol/L (ref 98–110)
CO2: 29 mmol/L (ref 20–31)
CREATININE: 0.74 mg/dL (ref 0.50–1.05)
Calcium: 9.5 mg/dL (ref 8.6–10.4)
GLUCOSE: 81 mg/dL (ref 65–99)
Potassium: 3.2 mmol/L — ABNORMAL LOW (ref 3.5–5.3)
Sodium: 141 mmol/L (ref 135–146)

## 2016-09-11 LAB — LIPID PANEL
CHOL/HDL RATIO: 3.5 ratio (ref <=5.0)
CHOLESTEROL: 138 mg/dL (ref 125–200)
HDL: 40 mg/dL — ABNORMAL LOW (ref >=46)
LDL Cholesterol: 86 mg/dL (ref <130)
TRIGLYCERIDES: 61 mg/dL (ref <150)
VLDL: 12 mg/dL (ref <30)

## 2016-09-11 LAB — TSH: TSH: 1.08 m[IU]/L

## 2016-09-11 NOTE — Progress Notes (Signed)
Janet Woods is a 60 y.o. female presents to office today for annual physical exam examination.  Concerns today include:  1. Carpal tunnel She reports numbness in bilateral hands R>L.  She notes that she has been wearing the wrist splints at night time but this is not helping.  She notes intermittent weakness of hands.  She reports she is taking Naproxen 500mg  TID with no improvement.    Last eye exam: >1 year Last dental exam: >1 year Last colonoscopy: 06/2011 Last mammogram: scheduled for 09/16/2016 Last pap smear: 07/2015, normal. Neg HPV Immunizations needed: flu Refills needed today: none  Past Medical History:  Diagnosis Date  . Carpal tunnel syndrome   . Hypertension    Social History   Social History  . Marital status: Single    Spouse name: N/A  . Number of children: N/A  . Years of education: N/A   Occupational History  . Not on file.   Social History Main Topics  . Smoking status: Never Smoker  . Smokeless tobacco: Never Used  . Alcohol use No  . Drug use: No  . Sexual activity: Not Currently    Birth control/ protection: None   Other Topics Concern  . Not on file   Social History Narrative   Lives with daughter with CP Janet Woods age 54).  No long term relationship.  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   No past surgical history on file. Family History  Problem Relation Age of Onset  . Heart disease Mother 49    MI  . Cancer Sister 37    lung cancer  . Stroke Daughter   . Stroke Maternal Grandmother   . Heart disease Maternal Grandfather   . Diabetes Neg Hx     ROS: Review of Systems Constitutional: negative Eyes: negative Ears, nose, mouth, throat, and face: negative Respiratory: negative Cardiovascular: negative Gastrointestinal: negative Genitourinary:positive for menopause Integument/breast: negative Hematologic/lymphatic: negative Musculoskeletal:positive for arthralgias and numbness and tingling of hands Neurological:  positive for paresthesia Behavioral/Psych: negative Endocrine: negative Allergic/Immunologic: negative   Physical exam BP 124/73   Pulse 81   Temp 97.8 F (36.6 C) (Oral)   Ht 5\' 7"  (1.702 m)   Wt 253 lb 6.4 oz (114.9 kg)   SpO2 97%   BMI 39.69 kg/m  General appearance: alert, cooperative, appears stated age and no distress Head: Normocephalic, without obvious abnormality, atraumatic Eyes: negative findings: lids and lashes normal, conjunctivae and sclerae normal, corneas clear and pupils equal, round, reactive to light and accomodation Ears: normal TM's and external ear canals both ears Nose: Nares normal. Septum midline. Mucosa normal. No drainage or sinus tenderness. Throat: lips, mucosa, and tongue normal; teeth and gums normal Neck: no adenopathy, supple, symmetrical, trachea midline and thyroid not enlarged, symmetric, no tenderness/mass/nodules Back: symmetric, no curvature. ROM normal. No CVA tenderness. Lungs: clear to auscultation bilaterally and normal WOB on room air Heart: regular rate and rhythm, S1, S2 normal, no murmur, click, rub or gallop Abdomen: normal findings: bowel sounds normal, no masses palpable, no organomegaly and soft, non-tender and abnormal findings:  obese Extremities: WWP, no cyanosis or edema. negative Phalen's, negative reverse phalen's, negative Tinel's. Pulses: 2+ and symmetric Skin: Skin color, texture, turgor normal. No rashes or lesions Lymph nodes: Cervical, supraclavicular, and axillary nodes normal. Neurologic: Alert and oriented X 3, normal strength and tone. Normal symmetric reflexes. Normal coordination and gait    Assessment/ Plan: Janet Woods here for annual physical exam.  Carpal tunnel syndrome, bilateral Patient describes symptoms consistent with carpal tunnel but exam unrevealing.  Concern that perhaps these symptoms may be coming from neck given intermittent weakness of hands.  Referral placed to orthopedics for further  evaluation.  Vitamin D deficiency Vit D level ordered  Obesity BMP, Lipid panel, TSH ordered.  Will contact with results.  Encourage weight loss through diet and exercise.  Though I suspect that this is difficult as patient is often dedicating most of her time and energy to her daughter, who has cerebral palsy and is completely dependent upon her mother for care.  Follow up in 1 year or sooner if needed.  ROI for colonoscopy results sent.  Mammogram scheduled for 09/16/2016.  Flu shot administered during this appt.  Jaedan Huttner M. Lajuana Ripple, DO PGY-3, [facility]

## 2016-09-11 NOTE — Patient Instructions (Signed)
I have placed a referral to [facility].  If you do not hear from [place] or their office in the next week, call our office.    Carpal Tunnel Syndrome Carpal tunnel syndrome is a condition that causes pain in your hand and arm. The carpal tunnel is a narrow area that is on the palm side of your wrist. Repeated wrist motion or certain diseases may cause swelling in the tunnel. This swelling can pinch the main nerve in the wrist (median nerve).  HOME CARE If You Have a Splint:  Wear it as told by your doctor. Remove it only as told by your doctor.  Loosen the splint if your fingers:  Become numb and tingle.  Turn blue and cold.  Keep the splint clean and dry. General Instructions  Take over-the-counter and prescription medicines only as told by your doctor.  Rest your wrist from any activity that may be causing your pain. If needed, talk to your employer about changes that can be made in your work, such as getting a wrist pad to use while typing.  If directed, apply ice to the painful area:  Put ice in a plastic bag.  Place a towel between your skin and the bag.  Leave the ice on for 20 minutes, 2-3 times per day.  Keep all follow-up visits as told by your doctor. This is important.  Do any exercises as told by your doctor, physical therapist, or occupational therapist. GET HELP IF:  You have new symptoms.  Medicine does not help your pain.  Your symptoms get worse.   This information is not intended to replace advice given to you by your health care provider. Make sure you discuss any questions you have with your health care provider.   Document Released: 10/23/2011 Document Revised: 07/25/2015 Document Reviewed: 03/21/2015 Elsevier Interactive Patient Education [employer].

## 2016-09-11 NOTE — Assessment & Plan Note (Signed)
Patient describes symptoms consistent with carpal tunnel but exam unrevealing.  Concern that perhaps these symptoms may be coming from neck given intermittent weakness of hands.  Referral placed to orthopedics for further evaluation.

## 2016-09-11 NOTE — Assessment & Plan Note (Signed)
BMP, Lipid panel, TSH ordered.  Will contact with results.  Encourage weight loss through diet and exercise.  Though I suspect that this is difficult as patient is often dedicating most of her time and energy to her daughter, who has cerebral palsy and is completely dependent upon her mother for care.

## 2016-09-11 NOTE — Assessment & Plan Note (Signed)
Vit D level ordered

## 2016-09-12 ENCOUNTER — Encounter: Payer: Self-pay | Admitting: Family Medicine

## 2016-09-12 DIAGNOSIS — E559 Vitamin D deficiency, unspecified: Secondary | ICD-10-CM

## 2016-09-12 LAB — VITAMIN D 25 HYDROXY (VIT D DEFICIENCY, FRACTURES): Vit D, 25-Hydroxy: 20 ng/mL — ABNORMAL LOW (ref 30–100)

## 2016-09-12 MED ORDER — VITAMIN D (ERGOCALCIFEROL) 1.25 MG (50000 UNIT) PO CAPS: 50000.0000 [IU] | ORAL_CAPSULE | ORAL | 0 refills | Status: DC

## 2016-09-16 ENCOUNTER — Ambulatory Visit
Admission: RE | Admit: 2016-09-16 | Discharge: 2016-09-16 | Disposition: A | Discharge status: Home / Self Care | Payer: PRIVATE HEALTH INSURANCE | Source: Ambulatory Visit | Attending: Family Medicine | Admitting: Family Medicine

## 2016-09-16 DIAGNOSIS — Z1231 Encounter for screening mammogram for malignant neoplasm of breast: Secondary | ICD-10-CM

## 2016-09-30 ENCOUNTER — Ambulatory Visit (INDEPENDENT_AMBULATORY_CARE_PROVIDER_SITE_OTHER): Payer: Self-pay | Admitting: Orthopaedic Surgery

## 2016-10-02 ENCOUNTER — Ambulatory Visit (INDEPENDENT_AMBULATORY_CARE_PROVIDER_SITE_OTHER): Payer: Self-pay | Admitting: Orthopaedic Surgery

## 2016-11-19 ENCOUNTER — Telehealth: Payer: Self-pay | Admitting: Family Medicine

## 2016-11-19 NOTE — Telephone Encounter (Signed)
Wellness claim form dropped off for at front desk for completion.  Verified that patient section of form has been completed.  Last DOS/WCC with PCP was 09/11/16 Placed form in white team folder to be completed by clinical staff.  Carmina Miller

## 2016-11-25 ENCOUNTER — Telehealth: Payer: Self-pay | Admitting: *Deleted

## 2016-11-25 NOTE — Telephone Encounter (Signed)
Dates completed documented and given to April, Leona, for printing and faxing.

## 2016-11-25 NOTE — Telephone Encounter (Signed)
Clinical info completed on Wellness form.  Place form in Dr. Marjean Donna box for completion.  Janet Woods, [name], [name]

## 2016-11-25 NOTE — Telephone Encounter (Signed)
Form completed by Dr. Lajuana Ripple and contacted pt to let them know that the forms would be up front to pt to pick up under her name. Katharina Caper, April D, [name]

## 2017-01-29 ENCOUNTER — Encounter: Payer: Self-pay | Admitting: Family Medicine

## 2017-01-29 ENCOUNTER — Ambulatory Visit (INDEPENDENT_AMBULATORY_CARE_PROVIDER_SITE_OTHER): Payer: PRIVATE HEALTH INSURANCE | Admitting: Family Medicine

## 2017-01-29 ENCOUNTER — Other Ambulatory Visit: Payer: Self-pay | Admitting: Family Medicine

## 2017-01-29 VITALS — Ht 67.0 in

## 2017-01-29 DIAGNOSIS — B029 Zoster without complications: Secondary | ICD-10-CM

## 2017-01-29 DIAGNOSIS — I10 Essential (primary) hypertension: Secondary | ICD-10-CM

## 2017-01-29 MED ORDER — VALACYCLOVIR HCL 1 G PO TABS: 1000.0000 mg | ORAL_TABLET | Freq: Three times a day (TID) | ORAL | 0 refills | Status: DC

## 2017-01-29 NOTE — Patient Instructions (Signed)
Shingles Shingles, which is also known as herpes zoster, is an infection that causes a painful skin rash and fluid-filled blisters. Shingles is not related to genital herpes, which is a sexually transmitted infection. Shingles only develops in people who:  Have had chickenpox.  Have received the chickenpox vaccine. (This is rare.)  What are the causes? Shingles is caused by varicella-zoster virus (VZV). This is the same virus that causes chickenpox. After exposure to VZV, the virus stays in the body in an inactive (dormant) state. Shingles develops if the virus reactivates. This can happen many years after the initial exposure to VZV. It is not known what causes this virus to reactivate. What increases the risk? People who have had chickenpox or received the chickenpox vaccine are at risk for shingles. Infection is more common in people who:  Are older than age 50.  Have a weakened defense (immune) system, such as those with HIV, AIDS, or cancer.  Are taking medicines that weaken the immune system, such as transplant medicines.  Are under great stress.  What are the signs or symptoms? Early symptoms of this condition include itching, tingling, and pain in an area on your skin. Pain may be described as burning, stabbing, or throbbing. A few days or weeks after symptoms start, a painful red rash appears, usually on one side of the body in a bandlike or beltlike pattern. The rash eventually turns into fluid-filled blisters that break open, scab over, and dry up in about 2-3 weeks. At any time during the infection, you may also develop:  A fever.  Chills.  A headache.  An upset stomach.  How is this diagnosed? This condition is diagnosed with a skin exam. Sometimes, skin or fluid samples are taken from the blisters before a diagnosis is made. These samples are examined under a microscope or sent to a lab for testing. How is this treated? There is no specific cure for this condition.  Your health care provider will probably prescribe medicines to help you manage pain, recover more quickly, and avoid long-term problems. Medicines may include:  Antiviral drugs.  Anti-inflammatory drugs.  Pain medicines.  If the area involved is on your face, you may be referred to a specialist, such as an eye doctor (ophthalmologist) or an ear, nose, and throat (ENT) doctor to help you avoid eye problems, chronic pain, or disability. Follow these instructions at home: Medicines  Take medicines only as directed by your health care provider.  Apply an anti-itch or numbing cream to the affected area as directed by your health care provider. Blister and Rash Care  Take a cool bath or apply cool compresses to the area of the rash or blisters as directed by your health care provider. This may help with pain and itching.  Keep your rash covered with a loose bandage (dressing). Wear loose-fitting clothing to help ease the pain of material rubbing against the rash.  Keep your rash and blisters clean with mild soap and cool water or as directed by your health care provider.  Check your rash every day for signs of infection. These include redness, swelling, and pain that lasts or increases.  Do not pick your blisters.  Do not scratch your rash. General instructions  Rest as directed by your health care provider.  Keep all follow-up visits as directed by your health care provider. This is important.  Until your blisters scab over, your infection can cause chickenpox in people who have never had it or been vaccinated   against it. To prevent this from happening, avoid contact with other people, especially: ? Babies. ? Pregnant women. ? Children who have eczema. ? Elderly people who have transplants. ? People who have chronic illnesses, such as leukemia or AIDS. Contact a health care provider if:  Your pain is not relieved with prescribed medicines.  Your pain does not get better after  the rash heals.  Your rash looks infected. Signs of infection include redness, swelling, and pain that lasts or increases. Get help right away if:  The rash is on your face or nose.  You have facial pain, pain around your eye area, or loss of feeling on one side of your face.  You have ear pain or you have ringing in your ear.  You have loss of taste.  Your condition gets worse. This information is not intended to replace advice given to you by your health care provider. Make sure you discuss any questions you have with your health care provider. Document Released: 11/03/2005 Document Revised: 06/29/2016 Document Reviewed: 09/14/2014 Elsevier Interactive Patient Education  2017 Elsevier Inc.  

## 2017-01-29 NOTE — Progress Notes (Signed)
   Subjective: CC: rash on back TTS:[id number] A Hipp is a 61 y.o. female presenting to clinic today for same day appointment. PCP: Ronnie Doss, DO Concerns today include:  1. Rash Patient reports that she developed a burning, itchy rash on Monday evening on her right lower back.  She reports that she applied rubbing alcohol to area with no relief.  She notes that she started using Hydrocortisone cream but that this is only helping some with the itching.  She notes that she went to the pharmacy today and was told by the pharmacist that it looks like shingles.  She denies fevers, chills, headache, nausea.  No Known Allergies  Social Hx reviewed: non smoker. MedHx, current medications and allergies reviewed.  Please see EMR. ROS: Per HPI  Objective: Ht 5\' 7"  (1.702 m)   Physical Examination:  General: Awake, alert, obese nourished, No acute distress HEENT: Normal, MMM Cardio: regular rate Pulm: normal work of breathing on room air Skin: right side of back in the T9-T10 dermatomal distribution with several stages of vesicles/ crusting.  Area is mildly erythematous and sensitive to touch.  Assessment/ Plan: 61 y.o. female   1. Herpes zoster without complication.  This appears to be a typical shingles rash.   08/2016 BMP reviewed.  Patient has normal renal function.  She is just under 72 hours from onset therefore, antiviral medication started.  Discussed home care instructions, frequent handwashing and return precautions. - Valtrex 1gm TID x7d - Ok to continue topical hydrocortisone if this provides relief - Recommend shingles vaccine once acute illness has resolved. - Follow up prn  [name], DO PGY-3, [facility]

## 2017-02-05 ENCOUNTER — Encounter: Payer: Self-pay | Admitting: Family Medicine

## 2017-02-05 ENCOUNTER — Ambulatory Visit: Payer: PRIVATE HEALTH INSURANCE | Admitting: Student

## 2017-02-05 ENCOUNTER — Ambulatory Visit (INDEPENDENT_AMBULATORY_CARE_PROVIDER_SITE_OTHER): Payer: PRIVATE HEALTH INSURANCE | Admitting: Family Medicine

## 2017-02-05 VITALS — BP 158/78 | HR 80 | Temp 97.9°F | Ht 67.0 in | Wt 263.4 lb

## 2017-02-05 DIAGNOSIS — B029 Zoster without complications: Secondary | ICD-10-CM | POA: Insufficient documentation

## 2017-02-05 NOTE — Assessment & Plan Note (Signed)
Appears to be healing Finish course of Valtrex Work note given Return precautions discussed Offered gabapentin for herpetic neuralgia pain, the patient refused Follow-up when necessary

## 2017-02-05 NOTE — Patient Instructions (Signed)
Nice to see you today.  The rash is healing.  Work note was given today. Follow-up as needed.  Take care, Dr. Jacinto Reap

## 2017-02-05 NOTE — Progress Notes (Signed)
   Subjective:   Janet Woods is a 61 y.o. female with a history of HTN, GERD, obesity, adjustment disorder here for follow-up of herpes zoster  Patient was seen 01/29/17 with herpes zoster with pathognomonic rash. She was treated with a seven-day course of Valtrex. Patient reports she will take her last dose of Valtrex tomorrow. She still has some pain along her right flank where the rashes. She works as a Chartered certified accountant, so she is not going to work so as to not expose other people. She thinks it may become somewhat better but she is unsure. She hasn't had any fevers. She is using hydrocortisone cream but taking no other medications.  Review of Systems:  Per HPI.   Social History: Never smoker  Objective:  BP (!) 158/78   Pulse 80   Temp 97.9 F (36.6 C) (Oral)   Ht 5\' 7"  (1.702 m)   Wt 263 lb 6.4 oz (119.5 kg)   SpO2 95%   BMI 41.25 kg/m   Gen:  61 y.o. female in NAD HEENT: NCAT, MMM, anicteric sclerae CV: RRR, no MRG Resp: Non-labored, CTAB, no wheezes noted Ext: WWP, no edema MSK: No obvious deformities, gait intact Skin: Right-sided back and the T9-T10 dermatomal distribution with healing vesicles and crusting. Area is mildly tender to palpation, but not erythematous or warm. Neuro: Alert and oriented, speech normal     Assessment & Plan:     Janet Woods is a 61 y.o. female here for   Herpes zoster without complication Appears to be healing Finish course of Valtrex Work note given Return precautions discussed Offered gabapentin for herpetic neuralgia pain, the patient refused Follow-up when necessary   Virginia Crews, MD MPH PGY-3,  [facility] 02/05/2017  3:44 PM

## 2017-05-19 ENCOUNTER — Other Ambulatory Visit: Payer: Self-pay | Admitting: *Deleted

## 2017-05-19 ENCOUNTER — Other Ambulatory Visit: Payer: Self-pay | Admitting: Family Medicine

## 2017-05-19 DIAGNOSIS — I10 Essential (primary) hypertension: Secondary | ICD-10-CM

## 2017-05-20 MED ORDER — HYDROCHLOROTHIAZIDE 25 MG PO TABS: 25.0000 mg | ORAL_TABLET | Freq: Every day | ORAL | 0 refills | Status: DC

## 2017-05-21 ENCOUNTER — Encounter: Payer: Self-pay | Admitting: Internal Medicine

## 2017-05-21 ENCOUNTER — Ambulatory Visit (INDEPENDENT_AMBULATORY_CARE_PROVIDER_SITE_OTHER): Payer: PRIVATE HEALTH INSURANCE | Admitting: Internal Medicine

## 2017-05-21 DIAGNOSIS — I10 Essential (primary) hypertension: Secondary | ICD-10-CM

## 2017-05-21 DIAGNOSIS — J329 Chronic sinusitis, unspecified: Secondary | ICD-10-CM

## 2017-05-21 MED ORDER — CETIRIZINE HCL 10 MG PO TABS: 10.0000 mg | ORAL_TABLET | Freq: Every day | ORAL | 2 refills | Status: DC

## 2017-05-21 MED ORDER — GUAIFENESIN ER 600 MG PO TB12: 600.0000 mg | ORAL_TABLET | Freq: Two times a day (BID) | ORAL | 0 refills | Status: DC

## 2017-05-21 MED ORDER — HYDROCHLOROTHIAZIDE 25 MG PO TABS: 25.0000 mg | ORAL_TABLET | Freq: Every day | ORAL | 0 refills | Status: DC

## 2017-05-21 NOTE — Patient Instructions (Addendum)
Please take zyrtec and mucinex for sinus. Please follow up with PCP in the next couple of months

## 2017-05-21 NOTE — Progress Notes (Signed)
   [facility] Kerrin Mo, MD Phone: [phone]  Reason For Visit: SDA for nasal congestion and blood pressure meds refill   # Post- nasal drip and nasal congestion.    Has been going on for about 3-4  days. Medications tried: has not tried the mucinex. However has had success with that along with zrytec in the past. Not interested in nasal sprays. No significant sinus pressure per patient.   Symptoms Fever: None  Headache or face pain: none  Tooth pain: none  Sneezing: yes   Scratchy throat:yes  No sinus pressure     Also would like a refill of blood pressure medication  CHRONIC HTN: Reports no concerns  Current Meds - HCTZ  Reports good compliance, took meds today. Tolerating well, w/o complaints Denies CP, dyspnea, HA, edema, dizziness / lightheadedness  Past Medical History Reviewed problem list.  Medications- reviewed and updated No additions to family history Social history- patient is a non-smoker  Objective: BP 136/70   Pulse 73   Temp 98.2 F (36.8 C) (Oral)   Ht 5\' 7"  (1.702 m)   Wt 248 lb 3.2 oz (112.6 kg)   SpO2 95%   BMI 38.87 kg/m  Gen: NAD, alert, cooperative with exam HEENT: Normal    Neck: No masses palpated. No lymphadenopathy    Nose: nasal turbinates with congestion     Throat: post- nasal drip noted at back pharynx  Cardio: regular rate and rhythm, S1S2 heard, no murmurs appreciated Pulm: clear to auscultation bilaterally, no wheezes, rhonchi or rales Skin: dry, intact, no rashes or lesions   Assessment/Plan: See problem based a/p  Essential hypertension Well controlled  Refilled HCTZ  Patient to follow up with PCP in the next couple of weeks   Sinusitis  Acute - Mild symptoms  - cetirizine (ZYRTEC) 10 MG tablet; Take 1 tablet (10 mg total) by mouth daily.  Dispense: 30 tablet; Refill: 2 - guaiFENesin (MUCINEX) 600 MG 12 hr tablet; Take 1 tablet (600 mg total) by mouth 2 (two) times daily.  Dispense: 30  tablet; Refill: 0

## 2017-05-21 NOTE — Assessment & Plan Note (Addendum)
Acute - Mild symptoms  - cetirizine (ZYRTEC) 10 MG tablet; Take 1 tablet (10 mg total) by mouth daily.  Dispense: 30 tablet; Refill: 2 - guaiFENesin (MUCINEX) 600 MG 12 hr tablet; Take 1 tablet (600 mg total) by mouth 2 (two) times daily.  Dispense: 30 tablet; Refill: 0

## 2017-05-21 NOTE — Assessment & Plan Note (Signed)
Well controlled  Refilled HCTZ  Patient to follow up with PCP in the next couple of weeks

## 2017-05-22 ENCOUNTER — Ambulatory Visit: Payer: PRIVATE HEALTH INSURANCE | Admitting: Family Medicine

## 2017-08-05 ENCOUNTER — Encounter: Payer: Self-pay | Admitting: Family Medicine

## 2017-08-05 ENCOUNTER — Ambulatory Visit (INDEPENDENT_AMBULATORY_CARE_PROVIDER_SITE_OTHER): Payer: PRIVATE HEALTH INSURANCE | Admitting: Family Medicine

## 2017-08-05 VITALS — BP 122/82 | HR 84 | Temp 98.0°F | Wt 249.0 lb

## 2017-08-05 DIAGNOSIS — J0101 Acute recurrent maxillary sinusitis: Secondary | ICD-10-CM | POA: Diagnosis not present

## 2017-08-05 MED ORDER — FLUTICASONE PROPIONATE 50 MCG/ACT NA SUSP: 2.0000 | Freq: Every day | NASAL | 6 refills | Status: DC

## 2017-08-05 MED ORDER — LEVOCETIRIZINE DIHYDROCHLORIDE 5 MG PO TABS: 5.0000 mg | ORAL_TABLET | Freq: Every evening | ORAL | 1 refills | Status: DC

## 2017-08-05 NOTE — Progress Notes (Signed)
     Subjective: Chief Complaint  Patient presents with  . Annual Exam  . Gynecologic Exam     HPI: Janet Woods is a 61 y.o. presenting to clinic today to discuss the following:  1 Continued Congestion and drainage 2 Gynecologic Exam  Janet Woods states today she wants to address her continued congestion, stuffy nose, and drainage. She states it started in April of 2018 and has continued since. She states she has water eyes at times in association with the symptoms listed above. She has to blow her nose often and has clear-green discharge. She denies fever, chills, sore throat, productive cough, rashes, or any kind of itching. Patient has no history of allergies that she is aware of and no new pets in the home.  She deferred her gynecologic exam today. Can perform at next visit.    Health Maintenance: none     ROS noted in HPI.   Past Medical, Surgical, Social, and Family History Reviewed & Updated per EMR.   Pertinent Historical Findings include:   History  Smoking Status  . Never Smoker  Smokeless Tobacco  . Never Used      Objective: BP 122/82   Pulse 84   Temp 98 F (36.7 C) (Oral)   Wt 249 lb (112.9 kg)   SpO2 99%   BMI 39.00 kg/m  Vitals and nursing notes reviewed  Physical Exam  Gen: Alert and Oriented x 3, NAD HEENT: Normocephalic, atraumatic, PERRLA, EOMI, TM visible with good light reflex, swollen, erythematous turbinates, normal pharyngeal mucosa, drainage noted. No sinus tenderness. Neck: trachea midline, no thyroidmegaly, no LAD Resp: CTAB, no wheezing, rales, or rhonchi, comfortable work of breathing CV: RRR, no murmurs, normal S1, S2 split, +2 pulses dorsalis pedis bilaterally Abd: non-distended, non-tender, soft, +bs in all four quadrants, no hepatosplenomegaly MSK: FROM in all four extremities Ext: no clubbing, cyanosis, or edema Neuro: CN II-XII intact, no focal or gross deficits Psych: appropriate behavior, mood, denies any suicidal  ideation   No results found for this or any previous visit (from the past 72 hour(s)).  Assessment/Plan:  Sinusitis Patient reported little to no benefit with using Zyretc but was not using flonase every day.  Advised Pt to use Flonase 2 puffs in each nostril every day twice each day for two weeks. Also changed Zyrtec to claritin and instructed patient to take once every day.  Instructed patient to return if she has not seen any change or if she gets worse in two weeks.    Please see problem based Assessment and Plan PATIENT EDUCATION PROVIDED: See AVS    Diagnosis and plan along with any newly prescribed medication(s) were discussed in detail with this patient today. The patient verbalized understanding and agreed with the plan. Patient advised if symptoms worsen return to clinic or ER.   Health Maintainance:   No orders of the defined types were placed in this encounter.   No orders of the defined types were placed in this encounter.    Harolyn Rutherford, DO 08/05/2017, 3:26 PM PGY-1, [place]

## 2017-08-05 NOTE — Patient Instructions (Signed)
It was great to meet you today! Thank you for letting me participate in your care!  Today, we discussed your current nasal congestion, runny nose, and drainage. You most likely are having allergies. I have prescribed you two medications to help with these symptoms. Please take them as prescribed.  If you have any questions, please call the office. We will see you in 4 weeks to see if you have improved. If you feel worse or your symptoms change do not hesitate to call and get an appointment to be seen earlier.  Be well, Harolyn Rutherford, DO PGY-1, [facility]

## 2017-08-09 NOTE — Assessment & Plan Note (Signed)
Patient reported little to no benefit with using Zyretc but was not using flonase every day.  Advised Pt to use Flonase 2 puffs in each nostril every day twice each day for two weeks. Also changed Zyrtec to claritin and instructed patient to take once every day.  Instructed patient to return if she has not seen any change or if she gets worse in two weeks.

## 2017-08-11 ENCOUNTER — Other Ambulatory Visit: Payer: Self-pay | Admitting: Family Medicine

## 2017-08-11 DIAGNOSIS — I10 Essential (primary) hypertension: Secondary | ICD-10-CM

## 2017-12-02 ENCOUNTER — Encounter (HOSPITAL_COMMUNITY): Payer: Self-pay | Admitting: Emergency Medicine

## 2017-12-02 ENCOUNTER — Emergency Department (HOSPITAL_COMMUNITY): Payer: PRIVATE HEALTH INSURANCE

## 2017-12-02 ENCOUNTER — Other Ambulatory Visit: Payer: Self-pay

## 2017-12-02 DIAGNOSIS — I10 Essential (primary) hypertension: Secondary | ICD-10-CM | POA: Insufficient documentation

## 2017-12-02 DIAGNOSIS — Z79899 Other long term (current) drug therapy: Secondary | ICD-10-CM | POA: Insufficient documentation

## 2017-12-02 DIAGNOSIS — K5901 Slow transit constipation: Secondary | ICD-10-CM | POA: Insufficient documentation

## 2017-12-02 NOTE — ED Triage Notes (Signed)
Patient here with abdominal pain and constipation with last two days.  She states that she has tried lactulose and Mag Citrate with no results.  She states her last BM was two days ago and now she is just having little hard balls of stool.

## 2017-12-03 ENCOUNTER — Emergency Department (HOSPITAL_COMMUNITY)
Admission: EM | Admit: 2017-12-03 | Discharge: 2017-12-03 | Disposition: A | Discharge status: Home / Self Care | Payer: PRIVATE HEALTH INSURANCE | Attending: Emergency Medicine | Admitting: Emergency Medicine

## 2017-12-03 DIAGNOSIS — K5901 Slow transit constipation: Secondary | ICD-10-CM

## 2017-12-03 MED ORDER — LACTULOSE 10 GM/15ML PO SOLN: 10.0000 g | Freq: Two times a day (BID) | ORAL | 0 refills | Status: DC | PRN

## 2017-12-03 NOTE — ED Provider Notes (Signed)
[facility] EMERGENCY DEPARTMENT Provider Note   CSN: [id number] Arrival date & time: 12/02/17  2005     History   Chief Complaint Chief Complaint  Patient presents with  . Constipation    HPI Janet Woods is a 62 y.o. female.  Patient presents to the emergency department for evaluation of constipation.  Patient reports that she has been feeling the need to have a bowel movement for 2 days but could not go.  She took some of her daughter's lactulose up tonight but did not have any immediate results.  She came to the ER for further evaluation.  While in the waiting room, however, she reports that she had a large bowel movement and her abdominal discomfort and urge to defecate has completely resolved.  She has no complaints.      Past Medical History:  Diagnosis Date  . Carpal tunnel syndrome   . Hypertension     Patient Active Problem List   Diagnosis Date Noted  . Sinusitis 05/21/2017  . Herpes zoster without complication 24/07/7352  . Leg swelling 05/11/2015  . Adjustment disorder with mixed anxiety and depressed mood 07/25/2014  . Memory loss of unknown cause 01/23/2014  . Screening for hyperlipidemia 01/23/2014  . Vitamin D deficiency 03/17/2013  . GERD (gastroesophageal reflux disease) 07/15/2012  . Carpal tunnel syndrome, bilateral 01/02/2012  . Obesity 03/09/2009  . Essential hypertension 01/17/2009    History reviewed. No pertinent surgical history.  OB History    No data available       Home Medications    Prior to Admission medications   Medication Sig Start Date End Date Taking? Authorizing Provider  cetirizine (ZYRTEC) 10 MG tablet Take 1 tablet (10 mg total) by mouth daily. 05/21/17   Mikell, Jeani Sow, MD  fluticasone (FLONASE) 50 MCG/ACT nasal spray Place 2 sprays into both nostrils daily. 08/05/17   Nuala Alpha, DO  furosemide (LASIX) 20 MG tablet Take 1 tablet (20 mg total) by mouth daily. Patient taking  differently: Take 20 mg by mouth daily as needed for fluid.  11/07/15   Dorie Rank, MD  guaiFENesin (MUCINEX) 600 MG 12 hr tablet Take 1 tablet (600 mg total) by mouth 2 (two) times daily. 05/21/17   Mikell, Jeani Sow, MD  hydrochlorothiazide (HYDRODIURIL) 25 MG tablet Take 1 tablet (25 mg total) by mouth daily. 05/21/17 08/19/17  Tonette Bihari, MD  hydrochlorothiazide (HYDRODIURIL) 25 MG tablet take 1 tablet by mouth once daily 08/11/17   Lockamy, Timothy, DO  lactulose (CHRONULAC) 10 GM/15ML solution Take 15 mLs (10 g total) by mouth 2 (two) times daily as needed for moderate constipation or severe constipation. 12/03/17   Orpah Greek, MD  levocetirizine (XYZAL) 5 MG tablet Take 1 tablet (5 mg total) by mouth every evening. 08/05/17   Nuala Alpha, DO  Menthol, Topical Analgesic, (BIOFREEZE) 10 % LIQD Apply 1 application topically 2 (two) times daily as needed (for shoulder pain).    [provider]  Menthol, Topical Analgesic, (MINERAL ICE) 2 % GEL Apply 1 application topically daily as needed (for shoulder pain).    [provider]  methocarbamol (ROBAXIN) 500 MG tablet Take 1 tablet (500 mg total) by mouth every 8 (eight) hours as needed for muscle spasms. 08/18/16   Jola Schmidt, MD  naproxen (NAPROSYN) 500 MG tablet take 1 tablet by mouth twice a day if needed for MILD PAIN 07/30/16   [name], DO  traZODone (DESYREL) 50 MG  tablet take 1 tablet by mouth at bedtime if needed for sleep 07/30/16   Ronnie Doss M, DO  valACYclovir (VALTREX) 1000 MG tablet Take 1 tablet (1,000 mg total) by mouth 3 (three) times daily. 01/29/17   [name], DO  Vitamin D, Ergocalciferol, (DRISDOL) 50000 units CAPS capsule Take 1 capsule (50,000 Units total) by mouth every 7 (seven) days. 09/12/16   [name], DO    Family History Family History  Problem Relation Age of Onset  . Heart disease Mother 75       MI  . Cancer Sister 2       lung  cancer  . Stroke Daughter   . Stroke Maternal Grandmother   . Heart disease Maternal Grandfather   . Diabetes Neg Hx     Social History Social History   Tobacco Use  . Smoking status: Never Smoker  . Smokeless tobacco: Never Used  Substance Use Topics  . Alcohol use: No  . Drug use: No     Allergies   Patient has no known allergies.   Review of Systems Review of Systems  Gastrointestinal: Positive for constipation.  All other systems reviewed and are negative.    Physical Exam Updated Vital Signs BP (!) 141/89 (BP Location: Left Arm)   Pulse 72   Temp 98.6 F (37 C) (Oral)   Resp 16   SpO2 97%   Physical Exam  Constitutional: She is oriented to person, place, and time. She appears well-developed and well-nourished. No distress.  HENT:  Head: Normocephalic and atraumatic.  Right Ear: Hearing normal.  Left Ear: Hearing normal.  Nose: Nose normal.  Mouth/Throat: Oropharynx is clear and moist and mucous membranes are normal.  Eyes: Conjunctivae and EOM are normal. Pupils are equal, round, and reactive to light.  Neck: Normal range of motion. Neck supple.  Cardiovascular: Regular rhythm, S1 normal and S2 normal. Exam reveals no gallop and no friction rub.  No murmur heard. Pulmonary/Chest: Effort normal and breath sounds normal. No respiratory distress. She exhibits no tenderness.  Abdominal: Soft. Normal appearance and bowel sounds are normal. There is no hepatosplenomegaly. There is no tenderness. There is no rebound, no guarding, no tenderness at McBurney's point and negative Murphy's sign. No hernia.  Musculoskeletal: Normal range of motion.  Neurological: She is alert and oriented to person, place, and time. She has normal strength. No cranial nerve deficit or sensory deficit. Coordination normal. GCS eye subscore is 4. GCS verbal subscore is 5. GCS motor subscore is 6.  Skin: Skin is warm, dry and intact. No rash noted. No cyanosis.  Psychiatric: She has a  normal mood and affect. Her speech is normal and behavior is normal. Thought content normal.  Nursing note and vitals reviewed.    ED Treatments / Results  Labs (all labs ordered are listed, but only abnormal results are displayed) Labs Reviewed - No data to display  EKG  EKG Interpretation None       Radiology Dg Abdomen 1 View  Result Date: 12/02/2017 CLINICAL DATA:  Constipation EXAM: ABDOMEN - 1 VIEW COMPARISON:  None. FINDINGS: Lung bases are clear. Mild gaseous enlargement of the colon. Small to moderate feces in the rectum. IMPRESSION: Mild gaseous enlargement of colon with small to moderate feces in the rectum. Electronically Signed   By: Donavan Foil M.D.   On: 12/02/2017 22:55    Procedures Procedures (including critical care time)  Medications Ordered in ED Medications - No data to display  Initial Impression / Assessment and Plan / ED Course  I have reviewed the triage vital signs and the nursing notes.  Pertinent labs & imaging results that were available during my care of the patient were reviewed by me and considered in my medical decision making (see chart for details).     Patient presented with concerns of constipation.  She used some of her daughter's lactulose at home tonight.  While waiting to get back to be in exam room in the waiting room, she had a large bowel movement and her symptoms have resolved.  She was counseled to hydrate herself, eat a high-fiber diet.  She will be given a prescription for lactulose to be used as needed.  She will follow-up with her primary doctor.  Final Clinical Impressions(s) / ED Diagnoses   Final diagnoses:  Slow transit constipation    ED Discharge Orders        Ordered    lactulose (CHRONULAC) 10 GM/15ML solution  2 times daily PRN     12/03/17 0520       Orpah Greek, MD 12/03/17 [phone]

## 2017-12-03 NOTE — ED Notes (Signed)
Pt questioning when the provider will be coming by. Informed pt it will be shortly, pt not receptive to my answer. Attempted to apologize for delay.

## 2017-12-03 NOTE — ED Notes (Signed)
Patient came to me at this time and stated she was able to have a BM.  She states that she feels better but she still wants to be seen.  She states she may not have gotten it all out.

## 2017-12-22 ENCOUNTER — Other Ambulatory Visit: Payer: Self-pay | Admitting: Family Medicine

## 2017-12-22 DIAGNOSIS — I10 Essential (primary) hypertension: Secondary | ICD-10-CM

## 2017-12-22 MED ORDER — HYDROCHLOROTHIAZIDE 25 MG PO TABS: 25.0000 mg | ORAL_TABLET | Freq: Every day | ORAL | 0 refills | Status: DC

## 2017-12-22 NOTE — Telephone Encounter (Signed)
Need refill HCTZ.  [facility] on [address].

## 2018-05-04 ENCOUNTER — Ambulatory Visit (INDEPENDENT_AMBULATORY_CARE_PROVIDER_SITE_OTHER): Payer: Self-pay | Admitting: Internal Medicine

## 2018-05-04 ENCOUNTER — Encounter: Payer: Self-pay | Admitting: Internal Medicine

## 2018-05-04 ENCOUNTER — Other Ambulatory Visit: Payer: Self-pay

## 2018-05-04 VITALS — BP 130/69 | HR 84 | Temp 97.7°F | Wt 244.0 lb

## 2018-05-04 DIAGNOSIS — G5603 Carpal tunnel syndrome, bilateral upper limbs: Secondary | ICD-10-CM

## 2018-05-04 DIAGNOSIS — E876 Hypokalemia: Secondary | ICD-10-CM

## 2018-05-04 DIAGNOSIS — Z8639 Personal history of other endocrine, nutritional and metabolic disease: Secondary | ICD-10-CM

## 2018-05-04 DIAGNOSIS — M545 Low back pain, unspecified: Secondary | ICD-10-CM

## 2018-05-04 DIAGNOSIS — I10 Essential (primary) hypertension: Secondary | ICD-10-CM

## 2018-05-04 MED ORDER — HYDROCHLOROTHIAZIDE 25 MG PO TABS: 25.0000 mg | ORAL_TABLET | Freq: Every day | ORAL | 3 refills | Status: DC

## 2018-05-04 MED ORDER — NAPROXEN 500 MG PO TABS: ORAL_TABLET | ORAL | 1 refills | Status: DC

## 2018-05-04 MED ORDER — METHOCARBAMOL 500 MG PO TABS: 500.0000 mg | ORAL_TABLET | Freq: Three times a day (TID) | ORAL | 1 refills | Status: DC | PRN

## 2018-05-04 NOTE — Patient Instructions (Signed)
Janet Woods,  Continue naproxen as needed. Try robaxin before bed then up to three times a day as needed. If back pain is not improving in a couple weeks, please come back to see [place].   I will call if any abnormal labs.  Best, Dr. Ola Spurr   Low Back Sprain Rehab Ask your health care provider which exercises are safe for you. Do exercises exactly as told by your health care provider and adjust them as directed. It is normal to feel mild stretching, pulling, tightness, or discomfort as you do these exercises, but you should stop right away if you feel sudden pain or your pain gets worse. Do not begin these exercises until told by your health care provider. Stretching and range of motion exercises These exercises warm up your muscles and joints and improve the movement and flexibility of your back. These exercises also help to relieve pain, numbness, and tingling. Exercise A: Lumbar rotation  1. Lie on your back on a firm surface and bend your knees. 2. Straighten your arms out to your sides so each arm forms an "L" shape with a side of your body (a 90 degree angle). 3. Slowly move both of your knees to one side of your body until you feel a stretch in your lower back. Try not to let your shoulders move off of the floor. 4. Hold for __________ seconds. 5. Tense your abdominal muscles and slowly move your knees back to the starting position. 6. Repeat this exercise on the other side of your body. Repeat __________ times. Complete this exercise __________ times a day. Exercise B: Prone extension on elbows  1. Lie on your abdomen on a firm surface. 2. Prop yourself up on your elbows. 3. Use your arms to help lift your chest up until you feel a gentle stretch in your abdomen and your lower back. ? This will place some of your body weight on your elbows. If this is uncomfortable, try stacking pillows under your chest. ? Your hips should stay down, against the surface that you are lying on. Keep  your hip and back muscles relaxed. 4. Hold for __________ seconds. 5. Slowly relax your upper body and return to the starting position. Repeat __________ times. Complete this exercise __________ times a day. Strengthening exercises These exercises build strength and endurance in your back. Endurance is the ability to use your muscles for a long time, even after they get tired. Exercise C: Pelvic tilt 1. Lie on your back on a firm surface. Bend your knees and keep your feet flat. 2. Tense your abdominal muscles. Tip your pelvis up toward the ceiling and flatten your lower back into the floor. ? To help with this exercise, you may place a small towel under your lower back and try to push your back into the towel. 3. Hold for __________ seconds. 4. Let your muscles relax completely before you repeat this exercise. Repeat __________ times. Complete this exercise __________ times a day. Exercise D: Alternating arm and leg raises  1. Get on your hands and knees on a firm surface. If you are on a hard floor, you may want to use padding to cushion your knees, such as an exercise mat. 2. Line up your arms and legs. Your hands should be below your shoulders, and your knees should be below your hips. 3. Lift your left leg behind you. At the same time, raise your right arm and straighten it in front of you. ? Do not  lift your leg higher than your hip. ? Do not lift your arm higher than your shoulder. ? Keep your abdominal and back muscles tight. ? Keep your hips facing the ground. ? Do not arch your back. ? Keep your balance carefully, and do not hold your breath. 4. Hold for __________ seconds. 5. Slowly return to the starting position and repeat with your right leg and your left arm. Repeat __________ times. Complete this exercise __________ times a day. Exercise E: Abdominal set with straight leg raise  1. Lie on your back on a firm surface. 2. Bend one of your knees and keep your other leg  straight. 3. Tense your abdominal muscles and lift your straight leg up, 4-6 inches (10-15 cm) off the ground. 4. Keep your abdominal muscles tight and hold for __________ seconds. ? Do not hold your breath. ? Do not arch your back. Keep it flat against the ground. 5. Keep your abdominal muscles tense as you slowly lower your leg back to the starting position. 6. Repeat with your other leg. Repeat __________ times. Complete this exercise __________ times a day. Posture and body mechanics  Body mechanics refers to the movements and positions of your body while you do your daily activities. Posture is part of body mechanics. Good posture and healthy body mechanics can help to relieve stress in your body's tissues and joints. Good posture means that your spine is in its natural S-curve position (your spine is neutral), your shoulders are pulled back slightly, and your head is not tipped forward. The following are general guidelines for applying improved posture and body mechanics to your everyday activities. Standing   When standing, keep your spine neutral and your feet about hip-width apart. Keep a slight bend in your knees. Your ears, shoulders, and hips should line up.  When you do a task in which you stand in one place for a long time, place one foot up on a stable object that is 2-4 inches (5-10 cm) high, such as a footstool. This helps keep your spine neutral. Sitting   When sitting, keep your spine neutral and keep your feet flat on the floor. Use a footrest, if necessary, and keep your thighs parallel to the floor. Avoid rounding your shoulders, and avoid tilting your head forward.  When working at a desk or a computer, keep your desk at a height where your hands are slightly lower than your elbows. Slide your chair under your desk so you are close enough to maintain good posture.  When working at a computer, place your monitor at a height where you are looking straight ahead and you do  not have to tilt your head forward or downward to look at the screen. Resting   When lying down and resting, avoid positions that are most painful for you.  If you have pain with activities such as sitting, bending, stooping, or squatting (flexion-based activities), lie in a position in which your body does not bend very much. For example, avoid curling up on your side with your arms and knees near your chest (fetal position).  If you have pain with activities such as standing for a long time or reaching with your arms (extension-based activities), lie with your spine in a neutral position and bend your knees slightly. Try the following positions:  Lying on your side with a pillow between your knees.  Lying on your back with a pillow under your knees. Lifting   When lifting objects, keep your  feet at least shoulder-width apart and tighten your abdominal muscles.  Bend your knees and hips and keep your spine neutral. It is important to lift using the strength of your legs, not your back. Do not lock your knees straight out.  Always ask for help to lift heavy or awkward objects. This information is not intended to replace advice given to you by your health care provider. Make sure you discuss any questions you have with your health care provider. Document Released: 11/03/2005 Document Revised: 07/10/2016 Document Reviewed: 08/15/2015 Elsevier Interactive Patient Education  [employer].

## 2018-05-05 LAB — BASIC METABOLIC PANEL
BUN / CREAT RATIO: 16 (ref 12–28)
BUN: 11 mg/dL (ref 8–27)
CHLORIDE: 102 mmol/L (ref 96–106)
CO2: 25 mmol/L (ref 20–29)
Calcium: 9.1 mg/dL (ref 8.7–10.3)
Creatinine, Ser: 0.68 mg/dL (ref 0.57–1.00)
GFR calc Af Amer: 110 mL/min/{1.73_m2} (ref >59)
GFR calc non Af Amer: 95 mL/min/{1.73_m2} (ref >59)
GLUCOSE: 83 mg/dL (ref 65–99)
Potassium: 3.3 mmol/L — ABNORMAL LOW (ref 3.5–5.2)
SODIUM: 142 mmol/L (ref 134–144)

## 2018-05-05 LAB — VITAMIN D 25 HYDROXY (VIT D DEFICIENCY, FRACTURES): VIT D 25 HYDROXY: 22.1 ng/mL — AB (ref 30.0–100.0)

## 2018-05-06 ENCOUNTER — Encounter: Payer: Self-pay | Admitting: Internal Medicine

## 2018-05-06 DIAGNOSIS — Z8639 Personal history of other endocrine, nutritional and metabolic disease: Secondary | ICD-10-CM | POA: Insufficient documentation

## 2018-05-06 DIAGNOSIS — M545 Low back pain, unspecified: Secondary | ICD-10-CM | POA: Insufficient documentation

## 2018-05-06 DIAGNOSIS — E876 Hypokalemia: Secondary | ICD-10-CM | POA: Insufficient documentation

## 2018-05-06 NOTE — Progress Notes (Signed)
[facility] Progress Note  Subjective:  Janet Woods is a 62 y.o. female with history of hypertension, obesity, and vitamin D deficiency who presents for back pain since Friday. She was moving boxes with pain starting afterwards. Pain feels like a pulling and tight sensation. She has tried bengay and naproxen with only a little relief. She is having trouble sleeping due to discomfort. Pain is limited to lower back without radiation down legs. She would like to walk more to lose weight so hopes symptoms will resolve soon. Had similar symptoms in 2017 with improvement with robaxin. ROS: No bowel or bladder incontinence. No falls.   Asked about labs for previously low potassium and vitamin D.   No Known Allergies  Social History   Tobacco Use  . Smoking status: Never Smoker  . Smokeless tobacco: Never Used  Substance Use Topics  . Alcohol use: No    Objective: Blood pressure 130/69, pulse 84, temperature 97.7 F (36.5 C), temperature source Oral, weight 244 lb (110.7 kg), SpO2 96 %. Body mass index is 38.22 kg/m. Constitutional: Obese female in NAD Musculoskeletal: No midline spinal TTP but does have lumbar paraspinal tenderness on right.  Neurological: Negative straight leg raise.  Skin: Skin is warm and dry. No rash noted.  Psychiatric: Normal mood and affect.  Vitals reviewed  Assessment/Plan: Acute right-sided low back pain without sciatica - Suspect muscle strain given start of symptoms after lifting. No sciatica to suggest pinched nerve.  - Refilled naproxen and prescribed robaxin.  - Provided handout on low back exercises. - To return in a couple weeks if no improvement.   Hypokalemia - Will check BMP. Takes hctz.   History of vitamin D deficiency - Check levels.  Follow-up prn.  Olene Floss, MD [place], PGY-3

## 2018-05-06 NOTE — Assessment & Plan Note (Signed)
-   Will check BMP. Takes hctz.

## 2018-05-06 NOTE — Assessment & Plan Note (Signed)
Check levels 

## 2018-05-06 NOTE — Assessment & Plan Note (Signed)
-   Suspect muscle strain given start of symptoms after lifting. No sciatica to suggest pinched nerve.  - Refilled naproxen and prescribed robaxin.  - Provided handout on low back exercises. - To return in a couple weeks if no improvement.

## 2018-09-28 ENCOUNTER — Other Ambulatory Visit (HOSPITAL_COMMUNITY)
Admission: RE | Admit: 2018-09-28 | Discharge: 2018-09-28 | Disposition: A | Discharge status: Home / Self Care | Payer: Self-pay | Source: Ambulatory Visit | Attending: Family Medicine | Admitting: Family Medicine

## 2018-09-28 ENCOUNTER — Ambulatory Visit (INDEPENDENT_AMBULATORY_CARE_PROVIDER_SITE_OTHER): Payer: Self-pay | Admitting: Family Medicine

## 2018-09-28 ENCOUNTER — Encounter: Payer: Self-pay | Admitting: Family Medicine

## 2018-09-28 VITALS — BP 145/70 | HR 67 | Temp 97.9°F | Wt 236.4 lb

## 2018-09-28 DIAGNOSIS — E6609 Other obesity due to excess calories: Secondary | ICD-10-CM

## 2018-09-28 DIAGNOSIS — E559 Vitamin D deficiency, unspecified: Secondary | ICD-10-CM

## 2018-09-28 DIAGNOSIS — Z0000 Encounter for general adult medical examination without abnormal findings: Secondary | ICD-10-CM | POA: Insufficient documentation

## 2018-09-28 DIAGNOSIS — G5603 Carpal tunnel syndrome, bilateral upper limbs: Secondary | ICD-10-CM

## 2018-09-28 DIAGNOSIS — Z8639 Personal history of other endocrine, nutritional and metabolic disease: Secondary | ICD-10-CM

## 2018-09-28 DIAGNOSIS — I10 Essential (primary) hypertension: Secondary | ICD-10-CM

## 2018-09-28 LAB — POCT GLYCOSYLATED HEMOGLOBIN (HGB A1C): Hemoglobin A1C: 5.8 % — AB (ref 4.0–5.6)

## 2018-09-28 MED ORDER — HYDROCHLOROTHIAZIDE 25 MG PO TABS: 25.0000 mg | ORAL_TABLET | Freq: Every day | ORAL | 3 refills | Status: DC

## 2018-09-28 MED ORDER — NAPROXEN 500 MG PO TABS: ORAL_TABLET | ORAL | 1 refills | Status: DC

## 2018-09-28 NOTE — Assessment & Plan Note (Signed)
Vit D level at last check was low at 22.1. Patient has finished taking weekly Vit D 50,000U for 8 weeks. - Recheck Vit D level today - Cont Vit D 50,000U weekly if still low, if low normal start with 400-800U OTC daily

## 2018-09-28 NOTE — Progress Notes (Signed)
Subjective:  HPI: Janet Woods is a 62 y.o. presenting to clinic today to discuss the following:  Physical Exam with Pap Smear Janet Woods presents today for a routine physical and health examination. She states she no active issues or concerns that she would like to discuss today. She agrees to have a pap smear done today as part of her routine health maintenance as well as have a mammogram. She denies any fever, chills, weight loss, SOB, chest pain, nausea, vomiting, abdominal pain, diarrhea, blood in the urine or stool.  HTN Patient has HTN that has been well controlled with HCTZ 25mg  daily. She has recently run out and I will refill today.  Vit D Deficiency Patient had known Vit D deficiency and was given prescription Vit D 50,000U to take weekly for 8 weeks. Patient has finished the weekly pills.   Health Maintenance: Pap smear today, order for mammogram sent, influenza vaccine self-reported    ROS noted in HPI.   Past Medical, Surgical, Social, and Family History Reviewed & Updated per EMR.   Pertinent Historical Findings include:   Social History   Tobacco Use  Smoking Status Never Smoker  Smokeless Tobacco Never Used    Objective: BP (!) 145/70   Pulse 67   Temp 97.9 F (36.6 C)   Wt 236 lb 6.6 oz (107.2 kg)   SpO2 95%   BMI 37.03 kg/m  Vitals and nursing notes reviewed  Physical Exam Gen: Alert and Oriented x 3, NAD HEENT: Normocephalic, atraumatic, PERRLA, EOMI, TM visible with good light reflex, non-swollen, non-erythematous turbinates, non-erythematous pharyngeal mucosa, no exudates Neck: trachea midline, no thyroidmegaly, no LAD CV: RRR, no murmurs, normal S1, S2 split Resp: CTAB, no wheezing, rales, or rhonchi, comfortable work of breathing GU: vagina and uterus normal, no rashes, lesions, or injury noted. No abnormal or malodorous discharge from the cervix or vagina. MSK: Moves all four extremities Ext: no clubbing, cyanosis, trace edema of the  left lower extremity from ankle to below the knee Neuro: No gross deficits Skin: warm, dry, intact, no rashes  Results for orders placed or performed in visit on 09/28/18 (from the past 72 hour(s))  HgB A1c     Status: Abnormal   Collection Time: 09/28/18  9:36 AM  Result Value Ref Range   Hemoglobin A1C 5.8 (A) 4.0 - 5.6 %   HbA1c POC (<> result, manual entry)     HbA1c, POC (prediabetic range)     HbA1c, POC (controlled diabetic range)      Assessment/Plan:  Essential hypertension Mildly elevated today however she had not taken her HCTZ today due to running out before her appointment. - Cont HCTZ 25mg  daily - BMP next visit  Vitamin D deficiency Vit D level at last check was low at 22.1. Patient has finished taking weekly Vit D 50,000U for 8 weeks. - Recheck Vit D level today - Cont Vit D 50,000U weekly if still low, if low normal start with 400-800U OTC daily  Healthcare maintenance Pap smear performed today. If normal she will have her last one at 62y/o. Mammogram ordered.   PATIENT EDUCATION PROVIDED: See AVS    Diagnosis and plan along with any newly prescribed medication(s) were discussed in detail with this patient today. The patient verbalized understanding and agreed with the plan. Patient advised if symptoms worsen return to clinic or ER.   Health Maintainance: pap smear today, mammogram ordered   Orders Placed This Encounter  Procedures  .  MM Digital Screening    SELF PAY- [facility] WANT TO COME AND PAY $160.00 NO  NEEDS/  NO IMPLANTS/  NO HX OF BR CA/ FJ/W/PT SPOKE WITH [place]- FJ    Standing Status:   Future    Standing Expiration Date:   09/29/2019    Order Specific Question:   Reason for Exam (SYMPTOM  OR DIAGNOSIS REQUIRED)    Answer:   screening    Order Specific Question:   Is the patient pregnant?    Answer:   No    Order Specific Question:   Preferred imaging location?    Answer:   [facility]  . Vitamin D, 25-hydroxy  . HgB A1c     Meds ordered this encounter  Medications  . hydrochlorothiazide (HYDRODIURIL) 25 MG tablet    Sig: Take 1 tablet (25 mg total) by mouth daily.    Dispense:  90 tablet    Refill:  3  . naproxen (NAPROSYN) 500 MG tablet    Sig: take 1 tablet by mouth twice a day if needed for MILD PAIN    Dispense:  60 tablet    Refill:  [place], DO 09/28/2018, 9:44 AM PGY-2 [place]

## 2018-09-28 NOTE — Patient Instructions (Addendum)
It was great to see you today! Thank you for letting me participate in your care!  Today, we performed a routine physical exam and a pap smear. If your results are abnormal I will call you, if they are normal I will send you a letter stating your results are normal. I have also ordered a follow up check of your Vit D level and will inform you of those results as well.  I have sent in an order for a mammogram, please ensure to get it done as it is important for your overall health to have regular screening for breast cancer.  Be well, Harolyn Rutherford, DO PGY-2, [facility]

## 2018-09-28 NOTE — Assessment & Plan Note (Signed)
Pap smear performed today. If normal she will have her last one at 62y/o. Mammogram ordered.

## 2018-09-28 NOTE — Assessment & Plan Note (Signed)
Mildly elevated today however she had not taken her HCTZ today due to running out before her appointment. - Cont HCTZ 25mg  daily - BMP next visit

## 2018-09-29 ENCOUNTER — Other Ambulatory Visit: Payer: Self-pay | Admitting: Family Medicine

## 2018-09-29 DIAGNOSIS — E559 Vitamin D deficiency, unspecified: Secondary | ICD-10-CM

## 2018-09-29 LAB — VITAMIN D 25 HYDROXY (VIT D DEFICIENCY, FRACTURES): Vit D, 25-Hydroxy: 19.5 ng/mL — ABNORMAL LOW (ref 30.0–100.0)

## 2018-09-29 MED ORDER — VITAMIN D (ERGOCALCIFEROL) 1.25 MG (50000 UNIT) PO CAPS: 50000.0000 [IU] | ORAL_CAPSULE | ORAL | 0 refills | Status: DC

## 2018-09-29 NOTE — Progress Notes (Signed)
Patient Vit D levels were low. Restarting 50,000U weekly for 12 weeks and then will recheck.

## 2018-09-29 NOTE — Progress Notes (Signed)
Future order for vit D draw after patient has completed 8 weeks of Vit D 50,000U

## 2018-09-30 ENCOUNTER — Encounter: Payer: Self-pay | Admitting: Family Medicine

## 2018-09-30 LAB — CYTOLOGY - PAP
Diagnosis: NEGATIVE
HPV: NOT DETECTED

## 2018-09-30 NOTE — Progress Notes (Signed)
Normal Pap results, will need next one in 3 years.

## 2018-11-11 ENCOUNTER — Other Ambulatory Visit: Payer: Self-pay

## 2018-11-11 DIAGNOSIS — E559 Vitamin D deficiency, unspecified: Secondary | ICD-10-CM

## 2018-11-12 ENCOUNTER — Ambulatory Visit
Admission: RE | Admit: 2018-11-12 | Discharge: 2018-11-12 | Disposition: A | Discharge status: Home / Self Care | Payer: No Typology Code available for payment source | Source: Ambulatory Visit | Attending: Family Medicine | Admitting: Family Medicine

## 2018-11-12 DIAGNOSIS — Z0000 Encounter for general adult medical examination without abnormal findings: Secondary | ICD-10-CM

## 2018-11-12 LAB — VITAMIN D 25 HYDROXY (VIT D DEFICIENCY, FRACTURES): Vit D, 25-Hydroxy: 48.9 ng/mL (ref 30.0–100.0)

## 2018-11-23 ENCOUNTER — Encounter: Payer: Self-pay | Admitting: Student in an Organized Health Care Education/Training Program

## 2018-11-23 ENCOUNTER — Ambulatory Visit (INDEPENDENT_AMBULATORY_CARE_PROVIDER_SITE_OTHER): Payer: Self-pay | Admitting: Student in an Organized Health Care Education/Training Program

## 2018-11-23 ENCOUNTER — Ambulatory Visit (HOSPITAL_COMMUNITY)
Admission: RE | Admit: 2018-11-23 | Discharge: 2018-11-23 | Disposition: A | Discharge status: Home / Self Care | Payer: Self-pay | Source: Ambulatory Visit | Attending: Family Medicine | Admitting: Family Medicine

## 2018-11-23 VITALS — BP 125/80 | HR 73 | Temp 98.2°F | Wt 243.2 lb

## 2018-11-23 DIAGNOSIS — R1031 Right lower quadrant pain: Secondary | ICD-10-CM

## 2018-11-23 DIAGNOSIS — M25551 Pain in right hip: Secondary | ICD-10-CM | POA: Insufficient documentation

## 2018-11-23 DIAGNOSIS — M545 Low back pain, unspecified: Secondary | ICD-10-CM

## 2018-11-23 DIAGNOSIS — R109 Unspecified abdominal pain: Secondary | ICD-10-CM

## 2018-11-23 LAB — POCT URINALYSIS DIP (MANUAL ENTRY)
BILIRUBIN UA: NEGATIVE
Glucose, UA: NEGATIVE mg/dL
Ketones, POC UA: NEGATIVE mg/dL
LEUKOCYTES UA: NEGATIVE
NITRITE UA: NEGATIVE
PH UA: 7.5 (ref 5.0–8.0)
Protein Ur, POC: NEGATIVE mg/dL
Spec Grav, UA: 1.02 (ref 1.010–1.025)
UROBILINOGEN UA: 0.2 U/dL

## 2018-11-23 LAB — POCT UA - MICROSCOPIC ONLY

## 2018-11-23 NOTE — Patient Instructions (Addendum)
It was a pleasure seeing you today in our clinic. Today we discussed your hip pain. Here is the treatment plan we have discussed and agreed upon together:  We ordered a CT scan of your kidney and hip at today's visit. I will call  with these results. If you do not hear from me within the next week, please give our office a call.  Our clinic's number is [phone]. Please call with questions or concerns about what we discussed today.  Be well, Dr. Burr Medico

## 2018-11-23 NOTE — Progress Notes (Signed)
CC:  Right hip/flank pain  HPI: Janet Woods is a 63 y.o. female with PMH: Patient Active Problem List   Diagnosis Date Noted  . History of vitamin D deficiency 05/06/2018  . Acute right-sided low back pain without sciatica 05/06/2018  . Adjustment disorder with mixed anxiety and depressed mood 07/25/2014  . Memory loss of unknown cause 01/23/2014  . GERD (gastroesophageal reflux disease) 07/15/2012  . Carpal tunnel syndrome, bilateral 01/02/2012  . Obesity 03/09/2009  . Essential hypertension 01/17/2009    Right hip/flank pain - worsening over the past week. Patient endorses falling to the floor one week ago on her right side. She had no pain immediately after the fall, but subsequently developed right flank pain radiating to the right pelvis. This has worsened over the past week. She has not had any fevers, gross hematuria, urinary frequency or urgency. She has been able to ambulate on the left leg. She has tried numerous OTC remedies including bengay, naproxen. She came in today because she is concerned that the pain may be "coming from her kidney."  Of note, the patient does have a special needs daughter who is usually wheelchair bound. She reports that she does not have to lift her daughter who can transfer bed-to-chair and walk short distances on her own.  Review of Symptoms:  See HPI for ROS.   CC, SH/smoking status, and VS noted.  Objective: BP 125/80   Pulse 73   Temp 98.2 F (36.8 C) (Oral)   Wt 243 lb 3.2 oz (110.3 kg)   SpO2 96%   BMI 38.09 kg/m  GEN: NAD, alert, cooperative, and pleasant. RESPIRATORY: Comfortable work of breathing, speaks in full sentences CV: Regular rate noted, distal extremities well perfused and warm without edema GI: Soft, non-distended, non-tender to deep palpation, normoactive BS, no hepatosplenomegaly SKIN: warm and dry, no rashes or lesions NEURO: II-XII grossly intact MSK: Moves 4 extremities equally PSYCH: AAOx3, appropriate  affect HIP: No palpable tenderness. Limited internal rotation due to stiffness, not due to pain. Normal gait.  Assessment and plan:  Acute right-sided low back pain without sciatica May be MSK pain 2/2 to fall. Checked UA to see if there were RBC's because pattern of pain is suggestive of acute nephrolithiasis. Reassured by no fevers. UA did show RBC's and will check a renal stone study. 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 Hip exam was limited due to baseline limitations in internal rotation at the hip. - continue naproxen, topical anesthetics - rest, ice - avoid exacerbating activities - will follow up renal stone study   Orders Placed This Encounter  Procedures  . DG HIP UNILAT WITH PELVIS 2-3 VIEWS RIGHT    Standing Status:   Future    Number of Occurrences:   1    Standing Expiration Date:   01/22/2020    Order Specific Question:   Reason for Exam (SYMPTOM  OR DIAGNOSIS REQUIRED)    Answer:   right hip pain after a fall    Order Specific Question:   Is patient pregnant?    Answer:   No    Order Specific Question:   Preferred imaging location?    Answer:   [facility]    Order Specific Question:   Radiology Contrast Protocol - do NOT remove file path    Answer:   \\[email or web address]  . CT RENAL STONE STUDY    Standing Status:  Future    Standing Expiration Date:   02/22/2020    Order Specific Question:   Is patient pregnant?    Answer:   No    Order Specific Question:   Preferred imaging location?    Answer:   [facility]    Order Specific Question:   Radiology Contrast Protocol - do NOT remove file path    Answer:   \\[email or web address].pdf  . CT HIP RIGHT WO CONTRAST    Standing Status:   Future    Standing Expiration Date:   02/22/2020    Order Specific Question:   Is patient pregnant?    Answer:   No    Order Specific Question:   Preferred imaging  location?    Answer:   [facility]    Order Specific Question:   Radiology Contrast Protocol - do NOT remove file path    Answer:   \\[email or web address]  . POCT urinalysis dipstick  . POCT UA - Microscopic Only    No orders of the defined types were placed in this encounter.    Everrett Coombe, MD,MS,  PGY3 11/25/2018 8:57 AM

## 2018-11-25 ENCOUNTER — Emergency Department (HOSPITAL_COMMUNITY)
Admission: EM | Admit: 2018-11-25 | Discharge: 2018-11-25 | Disposition: A | Discharge status: Home / Self Care | Payer: Self-pay | Attending: Emergency Medicine | Admitting: Emergency Medicine

## 2018-11-25 ENCOUNTER — Encounter (HOSPITAL_COMMUNITY): Payer: Self-pay | Admitting: Student

## 2018-11-25 ENCOUNTER — Emergency Department (HOSPITAL_COMMUNITY): Payer: Self-pay

## 2018-11-25 DIAGNOSIS — M545 Low back pain, unspecified: Secondary | ICD-10-CM

## 2018-11-25 DIAGNOSIS — Z6838 Body mass index (BMI) 38.0-38.9, adult: Secondary | ICD-10-CM | POA: Insufficient documentation

## 2018-11-25 DIAGNOSIS — Z79899 Other long term (current) drug therapy: Secondary | ICD-10-CM | POA: Insufficient documentation

## 2018-11-25 DIAGNOSIS — I10 Essential (primary) hypertension: Secondary | ICD-10-CM | POA: Insufficient documentation

## 2018-11-25 DIAGNOSIS — E669 Obesity, unspecified: Secondary | ICD-10-CM | POA: Insufficient documentation

## 2018-11-25 DIAGNOSIS — M25551 Pain in right hip: Secondary | ICD-10-CM | POA: Insufficient documentation

## 2018-11-25 LAB — BASIC METABOLIC PANEL
Anion gap: 8 (ref 5–15)
BUN: 12 mg/dL (ref 6–20)
CHLORIDE: 107 mmol/L (ref 98–111)
CO2: 24 mmol/L (ref 22–32)
Calcium: 8.9 mg/dL (ref 8.9–10.3)
Creatinine, Ser: 0.64 mg/dL (ref 0.44–1.00)
GFR calc Af Amer: >60 mL/min (ref >60)
Glucose, Bld: 90 mg/dL (ref 70–99)
POTASSIUM: 3.6 mmol/L (ref 3.5–5.1)
Sodium: 139 mmol/L (ref 135–145)

## 2018-11-25 LAB — URINALYSIS, ROUTINE W REFLEX MICROSCOPIC
Bilirubin Urine: NEGATIVE
GLUCOSE, UA: NEGATIVE mg/dL
HGB URINE DIPSTICK: NEGATIVE
Ketones, ur: NEGATIVE mg/dL
Leukocytes, UA: NEGATIVE
Nitrite: NEGATIVE
Protein, ur: NEGATIVE mg/dL
SPECIFIC GRAVITY, URINE: 1.018 (ref 1.005–1.030)
pH: 6 (ref 5.0–8.0)

## 2018-11-25 LAB — CBC
HCT: 40.6 % (ref 36.0–46.0)
Hemoglobin: 12.5 g/dL (ref 12.0–15.0)
MCH: 27.3 pg (ref 26.0–34.0)
MCHC: 30.8 g/dL (ref 30.0–36.0)
MCV: 88.6 fL (ref 80.0–100.0)
PLATELETS: 311 10*3/uL (ref 150–400)
RBC: 4.58 MIL/uL (ref 3.87–5.11)
RDW: 14.3 % (ref 11.5–15.5)
WBC: 7.2 10*3/uL (ref 4.0–10.5)
nRBC: 0 % (ref 0.0–0.2)

## 2018-11-25 MED ORDER — MELOXICAM 7.5 MG PO TABS: 7.5000 mg | ORAL_TABLET | Freq: Every day | ORAL | 0 refills | Status: DC

## 2018-11-25 MED ORDER — CYCLOBENZAPRINE HCL 10 MG PO TABS: 10.0000 mg | ORAL_TABLET | Freq: Two times a day (BID) | ORAL | 0 refills | Status: DC | PRN

## 2018-11-25 MED ORDER — LIDOCAINE 5 % EX PTCH: 1.0000 | MEDICATED_PATCH | CUTANEOUS | 0 refills | Status: AC

## 2018-11-25 NOTE — Assessment & Plan Note (Signed)
May be MSK pain 2/2 to fall. Checked UA to see if there were RBC's because pattern of pain is suggestive of acute nephrolithiasis. Reassured by no fevers. UA did show RBC's and will check a renal stone study. 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 Hip exam was limited due to baseline limitations in internal rotation at the hip. - continue naproxen, topical anesthetics - rest, ice - avoid exacerbating activities - will follow up renal stone study

## 2018-11-25 NOTE — ED Triage Notes (Signed)
Pt fell last week, went to her PCP and followed all the directions.  Despite several medications she continues to have pain.  Pain keeps pt from sleeping.

## 2018-11-25 NOTE — ED Provider Notes (Signed)
Janet Woods   CSN: [id number] Arrival date & time: 11/25/18  4097     History   Chief Complaint Chief Complaint  Patient presents with  . Back Pain    HPI Janet Woods is a 63 y.o. female with a hx of HTN, obesity, GERD, and adjustment disorder w/ mixed anxiety/depression who presents to the ED with complaints of right lower back pain x 1 week. Patient states she had a fall day prior to onset of pain- she was ambulating, slipped on wet floor, and fell onto the R hip/back, no head injury or LOC. Was not having much pain immediately after fall, started the next day. Pain has persisted and is in the right lower back radiating around to the hip. Pain is mostly with position changes and movement. Current discomfort is a 7/10 in severity. No alleviating factors. Tried naproxen & robaxin at home. Saw PCP 2 days prior- hip xray negative for fx/dislocation, UA with some hematuria- plan for CT renal study per patient and chart review- patient requesting she have this done in the ER today. She states she is peeing more often than usual. Denies numbness, tingling, weakness, saddle anesthesia, incontinence to bowel/bladder, fever, chills, IV drug use, dysuria, or hx of cancer. Patient has not had prior back surgeries. Denies nausea, vomiting, or diarrhea as well.   HPI  Past Medical History:  Diagnosis Date  . Carpal tunnel syndrome   . Hypertension     Patient Active Problem List   Diagnosis Date Noted  . History of vitamin D deficiency 05/06/2018  . Acute right-sided low back pain without sciatica 05/06/2018  . Adjustment disorder with mixed anxiety and depressed mood 07/25/2014  . Memory loss of unknown cause 01/23/2014  . GERD (gastroesophageal reflux disease) 07/15/2012  . Carpal tunnel syndrome, bilateral 01/02/2012  . Obesity 03/09/2009  . Essential hypertension 01/17/2009    No past surgical history on file.   OB History     No obstetric history on file.      Home Medications    Prior to Admission medications   Medication Sig Start Date End Date Taking? Authorizing Provider  cetirizine (ZYRTEC) 10 MG tablet Take 1 tablet (10 mg total) by mouth daily. 05/21/17   Mikell, Jeani Sow, MD  fluticasone (FLONASE) 50 MCG/ACT nasal spray Place 2 sprays into both nostrils daily. 08/05/17   Nuala Alpha, DO  hydrochlorothiazide (HYDRODIURIL) 25 MG tablet Take 1 tablet (25 mg total) by mouth daily. 09/28/18   Nuala Alpha, DO  levocetirizine (XYZAL) 5 MG tablet Take 1 tablet (5 mg total) by mouth every evening. 08/05/17   Nuala Alpha, DO  Menthol, Topical Analgesic, (BIOFREEZE) 10 % LIQD Apply 1 application topically 2 (two) times daily as needed (for shoulder pain).    [provider]  Menthol, Topical Analgesic, (MINERAL ICE) 2 % GEL Apply 1 application topically daily as needed (for shoulder pain).    [provider]  methocarbamol (ROBAXIN) 500 MG tablet Take 1 tablet (500 mg total) by mouth 3 (three) times daily as needed for muscle spasms. 05/04/18   Rogue Bussing, MD  naproxen (NAPROSYN) 500 MG tablet take 1 tablet by mouth twice a day if needed for MILD PAIN 09/28/18   Nuala Alpha, DO  traZODone (DESYREL) 50 MG tablet take 1 tablet by mouth at bedtime if needed for sleep 07/30/16   [name], DO  Vitamin D, Ergocalciferol, (DRISDOL) 1.25 MG (50000 UT) CAPS  capsule Take 1 capsule (50,000 Units total) by mouth every 7 (seven) days. 09/29/18   Nuala Alpha, DO    Family History Family History  Problem Relation Age of Onset  . Heart disease Mother 4       MI  . Cancer Sister 67       lung cancer  . Stroke Daughter   . Stroke Maternal Grandmother   . Heart disease Maternal Grandfather   . Diabetes Neg Hx     Social History Social History   Tobacco Use  . Smoking status: Never Smoker  . Smokeless tobacco: Never Used  Substance Use Topics  .  Alcohol use: No  . Drug use: No     Allergies   Patient has no known allergies.   Review of Systems Review of Systems  Constitutional: Negative for chills and fever.  Respiratory: Negative for shortness of breath.   Cardiovascular: Negative for chest pain.  Gastrointestinal: Negative for abdominal pain, nausea and vomiting.  Genitourinary: Positive for frequency. Negative for dysuria and urgency.  Musculoskeletal: Positive for arthralgias (R hip) and back pain.  Neurological: Negative for weakness and numbness.       Negative for incontinence or saddle anesthesia.   All other systems reviewed and are negative.   Physical Exam Updated Vital Signs BP 140/86 (BP Location: Right Arm)   Pulse 67   Temp 97.6 F (36.4 C) (Oral)   Resp 18   Ht 5' 6.5" (1.689 m)   Wt 110.2 kg   SpO2 95%   BMI 38.63 kg/m   Physical Exam Vitals signs and nursing Woods reviewed.  Constitutional:      General: She is not in acute distress.    Appearance: She is well-developed. She is not toxic-appearing.  HENT:     Head: Normocephalic and atraumatic.  Eyes:     General:        Right eye: No discharge.        Left eye: No discharge.     Conjunctiva/sclera: Conjunctivae normal.  Neck:     Musculoskeletal: Normal range of motion and neck supple. No spinous process tenderness or muscular tenderness.  Cardiovascular:     Rate and Rhythm: Normal rate and regular rhythm.  Pulmonary:     Effort: Pulmonary effort is normal. No respiratory distress.     Breath sounds: Normal breath sounds. No wheezing, rhonchi or rales.  Abdominal:     General: There is no distension.     Palpations: Abdomen is soft.     Tenderness: There is no abdominal tenderness. There is no guarding or rebound.  Musculoskeletal:     Comments: No obvious deformity, appreciable swelling, erythema, ecchymosis, significant open wounds, or increased warmth.  Lower Extremities: Patient has intact AROM to bilateral hips, knees and  ankle. R hip flexion reproduces patient's pain. No point/focal bony tenderness. She also has pain with transitioning from sitting to standing in the R hip. Back: No point/focal vertebral tenderness, no palpable step off or crepitus to the cervical, thoracic, or lumbar spine.    Skin:    General: Skin is warm and dry.     Findings: No rash.  Neurological:     Mental Status: She is alert.     Deep Tendon Reflexes:     Reflex Scores:      Patellar reflexes are 2+ on the right side and 2+ on the left side.    Comments: Clear speech.   Psychiatric:  Behavior: Behavior normal.     ED Treatments / Results  Labs (all labs ordered are listed, but only abnormal results are displayed) Labs Reviewed  CBC  BASIC METABOLIC PANEL  URINALYSIS, ROUTINE W REFLEX MICROSCOPIC    EKG None  Radiology Ct Renal Stone Study  Result Date: 11/25/2018 CLINICAL DATA:  Back/flank pain.  Recent fall EXAM: CT ABDOMEN AND PELVIS WITHOUT CONTRAST TECHNIQUE: Multidetector CT imaging of the abdomen and pelvis was performed following the standard protocol without oral or IV contrast. COMPARISON:  None. FINDINGS: Lower chest: There is mild atelectatic change in the left base region. There is no edema or consolidation. Hepatobiliary: No focal liver lesions are apparent on this noncontrast enhanced study. There is no perihepatic fluid. Gallbladder wall is not appreciably thickened. There is no biliary duct dilatation. Pancreas: No pancreatic mass or inflammatory focus. No peripancreatic fluid. Spleen: No splenic lesions evident.  No perisplenic fluid. Adrenals/Urinary Tract: Adrenals bilaterally appear normal. Kidneys bilaterally show no evident mass or hydronephrosis on either side. There is no evident renal or ureteral calculus on either side. Urinary bladder is midline with wall thickness within normal limits. Stomach/Bowel: There is no appreciable bowel wall or mesenteric thickening. There is moderate stool in the  colon. There is no evident bowel obstruction. There is no free air or portal venous air. Vascular/Lymphatic: There is no appreciable abdominal aortic aneurysm. No vascular lesions are evident on this noncontrast enhanced study. There is no evident adenopathy in the abdomen or pelvis. Reproductive: The uterus is anteverted. There is no evident pelvic mass. Other: Appendix appears unremarkable. There is no abscess or ascites in the abdomen or pelvis. There is a small ventral hernia containing fat but no bowel. Musculoskeletal: No demonstrable fracture or dislocation. There is degenerative change in the lumbar spine. There is mild osteitis pubis on the right. No blastic or lytic bone lesions. There is no intramuscular lesion evident. IMPRESSION: 1.  No evident renal or ureteral calculus.  No hydronephrosis. 2. No evident bowel obstruction. No abscess in the abdomen or pelvis. No periappendiceal region inflammation. 3.  No traumatic appearing lesions evident. 4.  Small ventral hernia containing only fat. Electronically Signed   By: Lowella Grip III M.D.   On: 11/25/2018 08:46   Dg Hip Unilat With Pelvis 2-3 Views Right  Result Date: 11/24/2018 CLINICAL DATA:  [place] 1 week ago, RIGHT hip pain EXAM: DG HIP (WITH OR WITHOUT PELVIS) 2-3V RIGHT COMPARISON:  None FINDINGS: Osseous mineralization normal. Joint spaces preserved. Mild sclerosis at pubic symphysis consistent with osteitis pubis. No acute fracture, dislocation, or bone destruction. IMPRESSION: No acute osseous abnormalities. Mild osteitis pubis. Electronically Signed   By: Lavonia Dana M.D.   On: 11/24/2018 08:37    Procedures Procedures (including critical care time)  Medications Ordered in ED Medications - No data to display   Initial Impression / Assessment and Plan / ED Course  I have reviewed the triage vital signs and the nursing notes.  Pertinent labs & imaging results that were available during my care of the patient were reviewed by me  and considered in my medical decision making (see chart for details).   Patient presents to the emergency department with right hip/lower back pain status post fall 1 week prior.  She has been seen by primary care and had an x-ray negative for fracture dislocation.  Per patient and chart review it seems that there was plan for CT renal study.  No back pain red flags.  Fairly  benign physical exam.  No point/focal midline tenderness.  No neuro deficits.  Given pain is worse with position changes/movement high suspicion for muscular etiology.  Patient persistent that she would like the imaging that was plan for outpatient today in the ER.  Will obtain basic labs including urinalysis as well as CT renal study.  Labs reviewed and unremarkable.  No anemia.  No leukocytosis.  No electrolyte disturbance.  Renal function within normal limits.  Urinalysis without UTI or hematuria today.  Her CT renal study does not show any renal or ureteral lithiasis, additionally no traumatic appearing lesions.  With reassuring work-up and overall reassuring physical exam I again suspect this is a musculoskeletal problem.  Given she is not responding well to naproxen and Robaxin therefore will discontinue &l trial Mobic, Flexeril, and Lidoderm patches with close primary care follow-up- discussed no driving/operating heavy machinery with flexeril. I discussed results, treatment plan, need for follow-up, and return precautions with the patient. Provided opportunity for questions, patient confirmed understanding and is in agreement with plan.   Final Clinical Impressions(s) / ED Diagnoses   Final diagnoses:  Acute right-sided low back pain without sciatica  Right hip pain    ED Discharge Orders         Ordered    lidocaine (LIDODERM) 5 %  Every 24 hours     11/25/18 0913    meloxicam (MOBIC) 7.5 MG tablet  Daily     11/25/18 0913    cyclobenzaprine (FLEXERIL) 10 MG tablet  2 times daily PRN     11/25/18 0913            Petrucelli, Glynda Jaeger, PA-C 11/25/18 7505    Merrily Pew, MD 11/27/18 1833

## 2018-11-25 NOTE — ED Notes (Signed)
Pt verbalized understanding of discharge paperwork, prescriptions and follow-up care 

## 2018-11-25 NOTE — ED Notes (Signed)
Patient transported to CT 

## 2018-11-25 NOTE — Discharge Instructions (Addendum)
You are seen in the ER today for back/hip pain.  Your work-up in the ER was reassuring.  Your labs were completely normal.  Your CT scan did not show any kidney stones or fractures or dislocations.  We suspect this is a muscular problem.  We are sending you home with some medicines to help with your discomfort:  - Mobic: Is a nonsteroidal anti-inflammatory medication that will help with pain and swelling. Be sure to take this medication as prescribed with food, 1 pill every 24 hours,  It should be taken with food, as it can cause stomach upset, and more seriously, stomach bleeding. Do not take other nonsteroidal anti-inflammatory medications with this such as Advil, Motrin, Aleve, Mobic, Goodie Powder, or Motrin.    - Flexeril: this is the muscle relaxer I have prescribed, this is meant to help with muscle tightness. Be aware that this medication may make you drowsy therefore the first time you take this it should be at a time you are in an environment where you can rest. Do not drive or operate heavy machinery when taking this medication. Do not drink alcohol or take other sedating medications with this medicine such as narcotics or benzodiazepines.   -Lidoderm patches: These are topical patches, please place 1 over top of the right hip/lower back daily to help with numbing/soothing.  You make take Tylenol per over the counter dosing with these medications.   We have prescribed you new medication(s) today. Discuss the medications prescribed today with your pharmacist as they can have adverse effects and interactions with your other medicines including over the counter and prescribed medications. Seek medical evaluation if you start to experience new or abnormal symptoms after taking one of these medicines, seek care immediately if you start to experience difficulty breathing, feeling of your throat closing, facial swelling, or rash as these could be indications of a more serious allergic  reaction  Please follow up with primary care within 1 week. Return to the ER for new or worsening symptoms or any other concerns.

## 2018-11-29 ENCOUNTER — Ambulatory Visit (HOSPITAL_COMMUNITY)
Admission: RE | Admit: 2018-11-29 | Discharge: 2018-11-29 | Disposition: A | Discharge status: Home / Self Care | Payer: Self-pay | Source: Ambulatory Visit | Attending: Family Medicine | Admitting: Family Medicine

## 2018-11-29 ENCOUNTER — Ambulatory Visit (HOSPITAL_COMMUNITY): Payer: No Typology Code available for payment source

## 2018-11-29 DIAGNOSIS — R1031 Right lower quadrant pain: Secondary | ICD-10-CM | POA: Insufficient documentation

## 2018-11-29 DIAGNOSIS — M25551 Pain in right hip: Secondary | ICD-10-CM | POA: Insufficient documentation

## 2018-11-29 DIAGNOSIS — R109 Unspecified abdominal pain: Secondary | ICD-10-CM | POA: Insufficient documentation

## 2018-12-01 ENCOUNTER — Telehealth: Payer: Self-pay | Admitting: *Deleted

## 2018-12-01 NOTE — Telephone Encounter (Signed)
LVM for pt to call office to inform her of below.Zimmerman Rumple, April D, CMA   

## 2018-12-01 NOTE — Telephone Encounter (Signed)
-----   Message from Everrett Coombe, MD sent at 12/01/2018  3:30 PM EST ----- Please inform the patient that she does not have a hip fracture or a kidney stone. Her hip pain is likely from her fall and it should resolve over time. She should follow up with [place] if it does not. I can call her if she has any additional questions.

## 2018-12-02 ENCOUNTER — Ambulatory Visit: Payer: Self-pay | Admitting: Student in an Organized Health Care Education/Training Program

## 2018-12-02 ENCOUNTER — Other Ambulatory Visit: Payer: Self-pay

## 2018-12-02 NOTE — Telephone Encounter (Signed)
Spoke to pt and Dr. Burr Medico. Message given. Janet Woods, CMA

## 2018-12-02 NOTE — Telephone Encounter (Signed)
Pt is returning April's phone call and would like for her or someone to call her back.

## 2019-01-25 ENCOUNTER — Telehealth: Payer: Self-pay | Admitting: *Deleted

## 2019-01-25 MED ORDER — OSELTAMIVIR PHOSPHATE 75 MG PO CAPS: 75.0000 mg | ORAL_CAPSULE | Freq: Every day | ORAL | 0 refills | Status: DC

## 2019-01-25 NOTE — Telephone Encounter (Signed)
Agree with this documentation and management.

## 2019-01-25 NOTE — Telephone Encounter (Signed)
Pt walks in, she was exposed to flu by daughters OT visit @ [facility] yesterday.  She was called by the director and was told to ask her provider for tamiflu.  Spoke with Dr. Andria Frames, verbal given and called in as directed. Fleeger, Salome Spotted, CMA

## 2019-04-10 ENCOUNTER — Emergency Department (HOSPITAL_COMMUNITY)
Admission: EM | Admit: 2019-04-10 | Discharge: 2019-04-10 | Disposition: A | Discharge status: Home / Self Care | Payer: Self-pay | Attending: Emergency Medicine | Admitting: Emergency Medicine

## 2019-04-10 ENCOUNTER — Encounter (HOSPITAL_COMMUNITY): Payer: Self-pay | Admitting: Emergency Medicine

## 2019-04-10 ENCOUNTER — Other Ambulatory Visit: Payer: Self-pay

## 2019-04-10 DIAGNOSIS — E876 Hypokalemia: Secondary | ICD-10-CM

## 2019-04-10 DIAGNOSIS — Z79899 Other long term (current) drug therapy: Secondary | ICD-10-CM | POA: Insufficient documentation

## 2019-04-10 DIAGNOSIS — R531 Weakness: Secondary | ICD-10-CM

## 2019-04-10 LAB — BASIC METABOLIC PANEL
Anion gap: 8 (ref 5–15)
BUN: 12 mg/dL (ref 8–23)
CO2: 30 mmol/L (ref 22–32)
Calcium: 9.1 mg/dL (ref 8.9–10.3)
Chloride: 102 mmol/L (ref 98–111)
Creatinine, Ser: 0.7 mg/dL (ref 0.44–1.00)
GFR calc Af Amer: >60 mL/min (ref >60)
GFR calc non Af Amer: >60 mL/min (ref >60)
Glucose, Bld: 102 mg/dL — ABNORMAL HIGH (ref 70–99)
Potassium: 3 mmol/L — ABNORMAL LOW (ref 3.5–5.1)
Sodium: 140 mmol/L (ref 135–145)

## 2019-04-10 LAB — CBC WITH DIFFERENTIAL/PLATELET
Abs Immature Granulocytes: 0.04 10*3/uL (ref 0.00–0.07)
Basophils Absolute: 0 10*3/uL (ref 0.0–0.1)
Basophils Relative: 0 %
Eosinophils Absolute: 0.1 10*3/uL (ref 0.0–0.5)
Eosinophils Relative: 1 %
HCT: 42.1 % (ref 36.0–46.0)
Hemoglobin: 13.6 g/dL (ref 12.0–15.0)
Immature Granulocytes: 0 %
Lymphocytes Relative: 32 %
Lymphs Abs: 3.7 10*3/uL (ref 0.7–4.0)
MCH: 28.6 pg (ref 26.0–34.0)
MCHC: 32.3 g/dL (ref 30.0–36.0)
MCV: 88.4 fL (ref 80.0–100.0)
Monocytes Absolute: 0.8 10*3/uL (ref 0.1–1.0)
Monocytes Relative: 7 %
Neutro Abs: 6.7 10*3/uL (ref 1.7–7.7)
Neutrophils Relative %: 60 %
Platelets: 306 10*3/uL (ref 150–400)
RBC: 4.76 MIL/uL (ref 3.87–5.11)
RDW: 14.4 % (ref 11.5–15.5)
WBC: 11.5 10*3/uL — ABNORMAL HIGH (ref 4.0–10.5)
nRBC: 0 % (ref 0.0–0.2)

## 2019-04-10 LAB — URINALYSIS, ROUTINE W REFLEX MICROSCOPIC
Bilirubin Urine: NEGATIVE
Glucose, UA: NEGATIVE mg/dL
Ketones, ur: NEGATIVE mg/dL
Leukocytes,Ua: NEGATIVE
Nitrite: NEGATIVE
Protein, ur: NEGATIVE mg/dL
Specific Gravity, Urine: 1.028 (ref 1.005–1.030)
pH: 5 (ref 5.0–8.0)

## 2019-04-10 LAB — CBG MONITORING, ED: Glucose-Capillary: 92 mg/dL (ref 70–99)

## 2019-04-10 MED ORDER — POTASSIUM CHLORIDE CRYS ER 20 MEQ PO TBCR
40.0000 meq | EXTENDED_RELEASE_TABLET | Freq: Once | ORAL | Status: AC
  Administered 2019-04-10: 40 meq via ORAL
  Filled 2019-04-10: qty 2

## 2019-04-10 MED ORDER — POTASSIUM CHLORIDE CRYS ER 20 MEQ PO TBCR: 20.0000 meq | EXTENDED_RELEASE_TABLET | Freq: Every day | ORAL | 0 refills | Status: DC

## 2019-04-10 MED ORDER — LACTATED RINGERS IV BOLUS
1000.0000 mL | Freq: Once | INTRAVENOUS | Status: AC
  Administered 2019-04-10: 1000 mL via INTRAVENOUS

## 2019-04-10 NOTE — ED Notes (Signed)
Patient verbalizes understanding of discharge instructions. Opportunity for questioning and answers were provided. Armband removed by staff, pt discharged from ED.  

## 2019-04-10 NOTE — ED Triage Notes (Signed)
C/o dry mouth and frequent urination since yesterday.  Pt states she has gained weight and wants to make sure she doesn't have diabetes.

## 2019-04-10 NOTE — ED Provider Notes (Signed)
[facility] EMERGENCY DEPARTMENT Provider Note   CSN: [id number] Arrival date & time: 04/10/19  1714    History   Chief Complaint Chief Complaint  Patient presents with  . dry mouth  . Urinary Frequency    HPI Janet Woods is a 63 y.o. female.     HPI  63 year old female presents with generalized weakness and feeling like she is dehydrated.  Started feeling this way a couple days ago when she first noticed some sweating.  She felt like she might just have the heat on too high.  She states she thinks she is dehydrated because she drinks too many sodas rather than water.  She has a generalized weakness/fatigue but no focal weakness or numbness.  No headaches, chest pain, shortness of breath.  She has not had a fever and has not had a cough.  She has had a little bit of dry mouth and some nasal congestion that she thinks might be from allergies.  No vomiting or diarrhea.  Past Medical History:  Diagnosis Date  . Carpal tunnel syndrome   . Hypertension     Patient Active Problem List   Diagnosis Date Noted  . History of vitamin D deficiency 05/06/2018  . Acute right-sided low back pain without sciatica 05/06/2018  . Adjustment disorder with mixed anxiety and depressed mood 07/25/2014  . Memory loss of unknown cause 01/23/2014  . GERD (gastroesophageal reflux disease) 07/15/2012  . Carpal tunnel syndrome, bilateral 01/02/2012  . Obesity 03/09/2009  . Essential hypertension 01/17/2009    History reviewed. No pertinent surgical history.   OB History   No obstetric history on file.      Home Medications    Prior to Admission medications   Medication Sig Start Date End Date Taking? Authorizing Provider  cetirizine (ZYRTEC) 10 MG tablet Take 1 tablet (10 mg total) by mouth daily. 05/21/17   Mikell, Jeani Sow, MD  cyclobenzaprine (FLEXERIL) 10 MG tablet Take 1 tablet (10 mg total) by mouth 2 (two) times daily as needed for muscle spasms. 11/25/18    Petrucelli, Samantha R, PA-C  fluticasone (FLONASE) 50 MCG/ACT nasal spray Place 2 sprays into both nostrils daily. 08/05/17   Nuala Alpha, DO  hydrochlorothiazide (HYDRODIURIL) 25 MG tablet Take 1 tablet (25 mg total) by mouth daily. 09/28/18   Nuala Alpha, DO  levocetirizine (XYZAL) 5 MG tablet Take 1 tablet (5 mg total) by mouth every evening. 08/05/17   Lockamy, Christia Reading, DO  lidocaine (LIDODERM) 5 % Place 1 patch onto the skin daily. Remove & Discard patch within 12 hours or as directed by MD 11/25/18   Petrucelli, Glynda Jaeger, PA-C  meloxicam (MOBIC) 7.5 MG tablet Take 1 tablet (7.5 mg total) by mouth daily. 11/25/18   Petrucelli, Samantha R, PA-C  Menthol, Topical Analgesic, (BIOFREEZE) 10 % LIQD Apply 1 application topically 2 (two) times daily as needed (for shoulder pain).    [provider]  Menthol, Topical Analgesic, (MINERAL ICE) 2 % GEL Apply 1 application topically daily as needed (for shoulder pain).    [provider]  oseltamivir (TAMIFLU) 75 MG capsule Take 1 capsule (75 mg total) by mouth daily. 01/25/19   Zenia Resides, MD  potassium chloride SA (K-DUR) 20 MEQ tablet Take 1 tablet (20 mEq total) by mouth daily. 04/10/19   Sherwood Gambler, MD  traZODone (DESYREL) 50 MG tablet take 1 tablet by mouth at bedtime if needed for sleep 07/30/16   [name], DO  Vitamin D, Ergocalciferol, (DRISDOL) 1.25 MG (50000 UT) CAPS capsule Take 1 capsule (50,000 Units total) by mouth every 7 (seven) days. 09/29/18   Nuala Alpha, DO    Family History Family History  Problem Relation Age of Onset  . Heart disease Mother 38       MI  . Cancer Sister 75       lung cancer  . Stroke Daughter   . Stroke Maternal Grandmother   . Heart disease Maternal Grandfather   . Diabetes Neg Hx     Social History Social History   Tobacco Use  . Smoking status: Never Smoker  . Smokeless tobacco: Never Used  Substance Use Topics  . Alcohol use: No  . Drug use: No      Allergies   Patient has no known allergies.   Review of Systems Review of Systems  Constitutional: Positive for fatigue. Negative for fever.  HENT: Positive for congestion and sore throat.   Respiratory: Negative for cough and shortness of breath.   Cardiovascular: Negative for chest pain.  Gastrointestinal: Negative for abdominal pain and vomiting.  Neurological: Negative for dizziness, weakness, numbness and headaches.  All other systems reviewed and are negative.    Physical Exam Updated Vital Signs BP 112/71   Pulse 84   Temp (!) 97.5 F (36.4 C) (Oral)   Resp (!) 22   SpO2 96%   Physical Exam Vitals signs and nursing note reviewed.  Constitutional:      Appearance: She is well-developed. She is obese.  HENT:     Head: Normocephalic and atraumatic.     Right Ear: External ear normal.     Left Ear: External ear normal.     Nose: Nose normal.  Eyes:     General:        Right eye: No discharge.        Left eye: No discharge.     Extraocular Movements: Extraocular movements intact.     Pupils: Pupils are equal, round, and reactive to light.  Cardiovascular:     Rate and Rhythm: Normal rate and regular rhythm.     Heart sounds: Normal heart sounds.  Pulmonary:     Effort: Pulmonary effort is normal.     Breath sounds: Normal breath sounds.  Abdominal:     General: There is no distension.     Palpations: Abdomen is soft.     Tenderness: There is no abdominal tenderness.  Skin:    General: Skin is warm and dry.  Neurological:     Mental Status: She is alert.     Comments: CN 3-12 grossly intact. 5/5 strength in all 4 extremities. Grossly normal sensation. Normal finger to nose.   Psychiatric:        Mood and Affect: Mood is not anxious.      ED Treatments / Results  Labs (all labs ordered are listed, but only abnormal results are displayed) Labs Reviewed  URINALYSIS, ROUTINE W REFLEX MICROSCOPIC - Abnormal; Notable for the following components:       Result Value   APPearance HAZY (*)    Hgb urine dipstick SMALL (*)    Bacteria, UA RARE (*)    All other components within normal limits  BASIC METABOLIC PANEL - Abnormal; Notable for the following components:   Potassium 3.0 (*)    Glucose, Bld 102 (*)    All other components within normal limits  CBC WITH DIFFERENTIAL/PLATELET - Abnormal; Notable for the following components:  WBC 11.5 (*)    All other components within normal limits  CBG MONITORING, ED  CBG MONITORING, ED    EKG EKG Interpretation  Date/Time:  "Sunday Apr 10 2019 20:40:35 EDT Ventricular Rate:  87 PR Interval:    QRS Duration: 97 QT Interval:  380 QTC Calculation: 458 R Axis:     Text Interpretation:  Sinus rhythm no acute ST/T changes no significant change since Dec 2016 Confirmed by Ligia Duguay ([id number]) on 04/10/2019 10:57:50 PM   Radiology No results found.  Procedures Procedures (including critical care time)  Medications Ordered in ED Medications  lactated ringers bolus 1,000 mL (0 mLs Intravenous Stopped 04/10/19 2212)  potassium chloride SA (K-DUR) CR tablet 40 mEq (40 mEq Oral Given 04/10/19 2252)     Initial Impression / Assessment and Plan / ED Course  I have reviewed the triage vital signs and the nursing notes.  Pertinent labs & imaging results that were available during my care of the patient were reviewed by me and considered in my medical decision making (see chart for details).        Patient's labs show mild hypokalemia which will be repleted.  No concerning ECG findings.  Exam is benign.  She could have some dehydration though does not have any kidney injury.  Encouraged to drink plenty of fluids and will give p.o. potassium replacement.  Discharged home with return precautions.  Final Clinical Impressions(s) / ED Diagnoses   Final diagnoses:  Hypokalemia  Generalized weakness    ED Discharge Orders         Ordered    potassium chloride SA (K-DUR) 20 MEQ tablet   Daily     05" /24/20 2259           Sherwood Gambler, MD 04/11/19 [phone]

## 2019-04-12 LAB — CBG MONITORING, ED: Glucose-Capillary: 95 mg/dL (ref 70–99)

## 2019-04-13 ENCOUNTER — Other Ambulatory Visit: Payer: Self-pay

## 2019-04-13 ENCOUNTER — Ambulatory Visit (INDEPENDENT_AMBULATORY_CARE_PROVIDER_SITE_OTHER): Payer: Self-pay | Admitting: Family Medicine

## 2019-04-13 VITALS — BP 119/80 | HR 91

## 2019-04-13 DIAGNOSIS — E876 Hypokalemia: Secondary | ICD-10-CM

## 2019-04-13 DIAGNOSIS — J069 Acute upper respiratory infection, unspecified: Secondary | ICD-10-CM

## 2019-04-13 DIAGNOSIS — E559 Vitamin D deficiency, unspecified: Secondary | ICD-10-CM

## 2019-04-13 MED ORDER — PHENOL 0.5 % MT SOLN: 1.0000 | OROMUCOSAL | 0 refills | Status: DC | PRN

## 2019-04-13 NOTE — Progress Notes (Signed)
Subjective: Chief Complaint  Patient presents with  . Follow-up     HPI: Janet Woods is a 63 y.o. presenting to clinic today to discuss the following:  ED Follow Up Patient was seen in the ED 3 days ago for "feeling bad" and was found be hydrated and to have hypokalemia with a potassium of 3.0. She was given IV fluids with IV potassium and discharged home the same day. She presents to clinic today with complaints. She seems stressed due to concern for having COVID. She endorses a feeling of "something in my throat" with congestion and generalized malaise since last Friday. She is eating and drinking normally. She has no fever, no CP, no cough, abdominal pain, nausea, vomiting.     ROS noted in HPI.   Past Medical, Surgical, Social, and Family History Reviewed & Updated per EMR.   Pertinent Historical Findings include:   Social History   Tobacco Use  Smoking Status Never Smoker  Smokeless Tobacco Never Used      Objective: BP 119/80   Pulse 91   SpO2 94%  Vitals and nursing notes reviewed  Physical Exam Gen: Alert and Oriented x 3, NAD HEENT: Normocephalic, atraumatic, PERRLA, EOMI, non-erythematous pharyngeal mucosa, no exudates Neck: trachea midline, no thyroidmegaly, no LAD CV: RRR, no murmurs, normal S1, S2 split Resp: CTAB, no wheezing, rales, or rhonchi, comfortable work of breathing Ext: no clubbing, cyanosis, or edema Skin: warm, dry, intact, no rashes  Results for orders placed or performed during the hospital encounter of 04/10/19 (from the past 72 hour(s))  CBG monitoring, ED     Status: None   Collection Time: 04/10/19  5:37 PM  Result Value Ref Range   Glucose-Capillary 95 70 - 99 mg/dL  Urinalysis, Routine w reflex microscopic- may I&O cath if menses     Status: Abnormal   Collection Time: 04/10/19  6:17 PM  Result Value Ref Range   Color, Urine YELLOW YELLOW   APPearance HAZY (A) CLEAR   Specific Gravity, Urine 1.028 1.005 - 1.030   pH  5.0 5.0 - 8.0   Glucose, UA NEGATIVE NEGATIVE mg/dL   Hgb urine dipstick SMALL (A) NEGATIVE   Bilirubin Urine NEGATIVE NEGATIVE   Ketones, ur NEGATIVE NEGATIVE mg/dL   Protein, ur NEGATIVE NEGATIVE mg/dL   Nitrite NEGATIVE NEGATIVE   Leukocytes,Ua NEGATIVE NEGATIVE   RBC / HPF 0-5 0 - 5 RBC/hpf   WBC, UA 0-5 0 - 5 WBC/hpf   Bacteria, UA RARE (A) NONE SEEN   Squamous Epithelial / LPF 0-5 0 - 5   Mucus PRESENT     Comment: Performed at [facility], [address] [place], [place] [address]  Basic metabolic panel     Status: Abnormal   Collection Time: 04/10/19  8:37 PM  Result Value Ref Range   Sodium 140 135 - 145 mmol/L   Potassium 3.0 (L) 3.5 - 5.1 mmol/L   Chloride 102 98 - 111 mmol/L   CO2 30 22 - 32 mmol/L   Glucose, Bld 102 (H) 70 - 99 mg/dL   BUN 12 8 - 23 mg/dL   Creatinine, Ser 0.70 0.44 - 1.00 mg/dL   Calcium 9.1 8.9 - 10.3 mg/dL   GFR calc non Af Amer >60 >60 mL/min   GFR calc Af Amer >60 >60 mL/min   Anion gap 8 5 - 15    Comment: Performed at [facility], [place] [address], [place], [place]  [address]  CBC with Differential     Status: Abnormal   Collection Time: 04/10/19  8:37 PM  Result Value Ref Range   WBC 11.5 (H) 4.0 - 10.5 K/uL   RBC 4.76 3.87 - 5.11 MIL/uL   Hemoglobin 13.6 12.0 - 15.0 g/dL   HCT 42.1 36.0 - 46.0 %   MCV 88.4 80.0 - 100.0 fL   MCH 28.6 26.0 - 34.0 pg   MCHC 32.3 30.0 - 36.0 g/dL   RDW 14.4 11.5 - 15.5 %   Platelets 306 150 - 400 K/uL   nRBC 0.0 0.0 - 0.2 %   Neutrophils Relative % 60 %   Neutro Abs 6.7 1.7 - 7.7 K/uL   Lymphocytes Relative 32 %   Lymphs Abs 3.7 0.7 - 4.0 K/uL   Monocytes Relative 7 %   Monocytes Absolute 0.8 0.1 - 1.0 K/uL   Eosinophils Relative 1 %   Eosinophils Absolute 0.1 0.0 - 0.5 K/uL   Basophils Relative 0 %   Basophils Absolute 0.0 0.0 - 0.1 K/uL   Immature Granulocytes 0 %   Abs Immature Granulocytes 0.04 0.00 - 0.07 K/uL    Comment: Performed at [facility] [address],  [place], [place] [address]  POC CBG, ED     Status: None   Collection Time: 04/10/19  8:43 PM  Result Value Ref Range   Glucose-Capillary 92 70 - 99 mg/dL    Assessment/Plan:  Viral upper respiratory tract infection Most likely her symptoms are a result of a mild URI. She does not have symptoms consistent with COVID and is stable with no cough or fever.  - Patient instructed to pursue supportive measures and practice social distancing, wearing mask when out, washing hands, and to self-quarantine as much as possible until symptoms subside. - Phenol mouth spray for sore throat  Hypokalemia Rechecking potassium today after patient was found to have low potassium of 3.0 in ED - Patient eating one bannana per day - Hold Potassium pills for now  Vitamin D insufficiency Rechecking Vit D levels after patient was on 50k Units weekly   PATIENT EDUCATION PROVIDED: See AVS    Diagnosis and plan along with any newly prescribed medication(s) were discussed in detail with this patient today. The patient verbalized understanding and agreed with the plan. Patient advised if symptoms worsen return to clinic or ER.    Orders Placed This Encounter  Procedures  . Basic Metabolic Panel  . Vitamin D, 25-hydroxy    Meds ordered this encounter  Medications  . Phenol 0.5 % SOLN    Sig: Use as directed 1 spray in the mouth or throat every 2 (two) hours as needed.    Dispense:  120 mL    Refill:  0     Harolyn Rutherford, DO 04/13/2019, 8:40 AM PGY-2 [place]

## 2019-04-13 NOTE — Patient Instructions (Addendum)
It was great to see you today! Thank you for letting me participate in your care!  Today, we discussed your recent symptoms and they are not consistent with a Coronavirus infection. Please continue to practice social distancing measures, wash your hands, wear a mask outside when around others, and limit your exposure to other people. I have sent in a mouth spray to help with your sore throat.  I will call you if your lab work is abnormal.  Be well, Janet Rutherford, DO PGY-2, [facility]

## 2019-04-13 NOTE — Assessment & Plan Note (Signed)
Rechecking Vit D levels after patient was on 50k Units weekly

## 2019-04-13 NOTE — Assessment & Plan Note (Signed)
Most likely her symptoms are a result of a mild URI. She does not have symptoms consistent with COVID and is stable with no cough or fever.  - Patient instructed to pursue supportive measures and practice social distancing, wearing mask when out, washing hands, and to self-quarantine as much as possible until symptoms subside. - Phenol mouth spray for sore throat

## 2019-04-13 NOTE — Assessment & Plan Note (Addendum)
Rechecking potassium today after patient was found to have low potassium of 3.0 in ED - Patient eating one bannana per day - Hold Potassium pills for now

## 2019-04-14 LAB — BASIC METABOLIC PANEL
BUN/Creatinine Ratio: 14 (ref 12–28)
BUN: 10 mg/dL (ref 8–27)
CO2: 27 mmol/L (ref 20–29)
Calcium: 9.3 mg/dL (ref 8.7–10.3)
Chloride: 98 mmol/L (ref 96–106)
Creatinine, Ser: 0.71 mg/dL (ref 0.57–1.00)
GFR calc Af Amer: 106 mL/min/{1.73_m2} (ref >59)
GFR calc non Af Amer: 92 mL/min/{1.73_m2} (ref >59)
Glucose: 109 mg/dL — ABNORMAL HIGH (ref 65–99)
Potassium: 3.7 mmol/L (ref 3.5–5.2)
Sodium: 140 mmol/L (ref 134–144)

## 2019-04-14 LAB — VITAMIN D 25 HYDROXY (VIT D DEFICIENCY, FRACTURES): Vit D, 25-Hydroxy: 23.2 ng/mL — ABNORMAL LOW (ref 30.0–100.0)

## 2019-04-15 ENCOUNTER — Telehealth: Payer: Self-pay

## 2019-04-15 NOTE — Telephone Encounter (Signed)
Patients LVM on nurse line requesting lab results from recent visit. Please advise.

## 2019-05-31 ENCOUNTER — Telehealth: Payer: Self-pay

## 2019-05-31 ENCOUNTER — Other Ambulatory Visit: Payer: Self-pay | Admitting: Family Medicine

## 2019-05-31 ENCOUNTER — Encounter: Payer: Self-pay | Admitting: Family Medicine

## 2019-05-31 DIAGNOSIS — E559 Vitamin D deficiency, unspecified: Secondary | ICD-10-CM

## 2019-05-31 MED ORDER — VITAMIN D (ERGOCALCIFEROL) 1.25 MG (50000 UNIT) PO CAPS: 50000.0000 [IU] | ORAL_CAPSULE | ORAL | 0 refills | Status: DC

## 2019-05-31 NOTE — Progress Notes (Signed)
Results for patient labs

## 2019-05-31 NOTE — Telephone Encounter (Signed)
Informed patient of RX for Vit D at pharmacy and that she needs to make an appointment in 8 weeks with Dr. Garlan Fillers.  Janet Woods, [place]

## 2019-05-31 NOTE — Progress Notes (Signed)
Vit D low, prescribing 50k units weekly for 8 weeks and then will recheck

## 2019-06-22 ENCOUNTER — Other Ambulatory Visit: Payer: Self-pay

## 2019-06-22 DIAGNOSIS — Z20822 Contact with and (suspected) exposure to covid-19: Secondary | ICD-10-CM

## 2019-06-23 LAB — NOVEL CORONAVIRUS, NAA: SARS-CoV-2, NAA: NOT DETECTED

## 2019-08-01 ENCOUNTER — Ambulatory Visit (INDEPENDENT_AMBULATORY_CARE_PROVIDER_SITE_OTHER): Payer: Self-pay | Admitting: *Deleted

## 2019-08-01 ENCOUNTER — Other Ambulatory Visit: Payer: Self-pay

## 2019-08-01 DIAGNOSIS — Z23 Encounter for immunization: Secondary | ICD-10-CM

## 2019-08-01 NOTE — Progress Notes (Signed)
Pt tolerated vaccine well. Was informed that she will be billed because she didn't have any insurance. She stated that they would give her a discount and she wasn't worried about it. Deseree Kennon Holter, CMA

## 2019-10-03 ENCOUNTER — Ambulatory Visit (INDEPENDENT_AMBULATORY_CARE_PROVIDER_SITE_OTHER): Payer: Self-pay | Admitting: Family Medicine

## 2019-10-03 ENCOUNTER — Encounter: Payer: Self-pay | Admitting: Family Medicine

## 2019-10-03 ENCOUNTER — Other Ambulatory Visit: Payer: Self-pay

## 2019-10-03 VITALS — BP 132/84 | HR 78 | Wt 255.4 lb

## 2019-10-03 DIAGNOSIS — Z0000 Encounter for general adult medical examination without abnormal findings: Secondary | ICD-10-CM

## 2019-10-03 DIAGNOSIS — I10 Essential (primary) hypertension: Secondary | ICD-10-CM

## 2019-10-03 MED ORDER — NAPROXEN 500 MG PO TABS: 500.0000 mg | ORAL_TABLET | ORAL | 3 refills | Status: DC | PRN

## 2019-10-03 MED ORDER — HYDROCHLOROTHIAZIDE 25 MG PO TABS: 25.0000 mg | ORAL_TABLET | Freq: Every day | ORAL | 3 refills | Status: DC

## 2019-10-03 NOTE — Progress Notes (Signed)
  Patient Name: Janet Woods Date of Birth: 1956-10-09 Date of Visit: 10/03/19 PCP: Stark Klein, MD  Chief Complaint: annual physical   Subjective: Janet Woods is a 63 y.o. with medical history significant for HTN, GERD and carpal tunnel syndrome presenting today for annual physical.  HTN   Janet Woods states she is doing well and denies HA, chest pain, blurry vision or SOB. Patient states she has no trouble taking her HCTZ and has been taking her medication regularly. She denies any side effects from her medication.  Patient reports she has been walking for 1 hour per day around her home and back yard. She reports that she has been measuring her blood pressures at home and ranges from 130s-140s.  Carpal Tunnel  Patient reports having intermittent pain due to her carpal tunnel in both wrists. She states that she uses her braces and intermittently takes Naproxen to help with her symptoms. I counseled patient to avoid taking NSAIDs too often as it could affect her kidneys. Patient expressed understanding and stated that she only takes them when she has severe pain. Patient requests a refill for naproxen.   I have reviewed the patient's medical, surgical, family, and social history as appropriate.  Vitals:   10/03/19 1336  BP: 132/84  Pulse: 78  SpO2: 97%    Physical Exam:   General:obese female in NAD, Alert and cooperative  HEENT: Neck non-tender without lymphadenopathy, masses or thyromegaly Cardio: Normal S1 and S2, no S3 or S4. Rhythm is regular. No murmurs or rubs.   Pulm: Clear to auscultation bilaterally, no crackles, wheezing, or diminished breath sounds. Normal respiratory effort Abdomen: Bowel sounds normal. Abdomen soft and non-tender.  Extremities: No peripheral edema. Warm/ well perfused. Neuro: Cranial nerves grossly intact MSK: no wrist swelling or erythema, patient has normal ROM bilaterally  Assessment & Plan:   Essential hypertension Patient  with blood pressure <140/90 today 132/84 and measuring blood pressures at home.  -refill HCTZ -collect BMP today to check electrolytes  -encouraged patient to continue walking for exercise daily and measuring BP at home   Healthcare maintenance Patient is due for Tetanus booster. Patient offered injection but patient is declining due to self pay status today. Will reconsider when patient has insurance coverage.     Return to care in 1 year or earlier if needed.   Stark Klein, MD  Family Medicine  PGY1

## 2019-10-03 NOTE — Patient Instructions (Addendum)
It was a pleasure meeting you today.  Today we refilled your prescriptions for Naproxen and Hydrochlorothiazide (fluid pill). Please take the naproxen only as needed and not for several days in a row as it could cause damage to your kidneys if taken too often.  Please continue monitoring your blood pressure at home and make note of your measurements for your next visit.  Please continue to walk daily for exercise.  I look forward to seeing you in 1 year for your next physical or sooner if you need [place].  I will notify you if there are any abnormal results for your blood work today.   Managing Your Hypertension Hypertension is commonly called high blood pressure. This is when the force of your blood pressing against the walls of your arteries is too strong. Arteries are blood vessels that carry blood from your heart throughout your body. Hypertension forces the heart to work harder to pump blood, and may cause the arteries to become narrow or stiff. Having untreated or uncontrolled hypertension can cause heart attack, stroke, kidney disease, and other problems. What are blood pressure readings? A blood pressure reading consists of a higher number over a lower number. Ideally, your blood pressure should be below 120/80. The first ("top") number is called the systolic pressure. It is a measure of the pressure in your arteries as your heart beats. The second ("bottom") number is called the diastolic pressure. It is a measure of the pressure in your arteries as the heart relaxes. What does my blood pressure reading mean? Blood pressure is classified into four stages. Based on your blood pressure reading, your health care provider may use the following stages to determine what type of treatment you need, if any. Systolic pressure and diastolic pressure are measured in a unit called mm Hg. Normal  Systolic pressure: below 123456.  Diastolic pressure: below 80. Elevated  Systolic pressure: [id number].   Diastolic pressure: below 80. Hypertension stage 1  Systolic pressure: 0000000.  Diastolic pressure: [id number]. Hypertension stage 2  Systolic pressure: [id number] or above.  Diastolic pressure: 90 or above. What health risks are associated with hypertension? Managing your hypertension is an important responsibility. Uncontrolled hypertension can lead to:  A heart attack.  A stroke.  A weakened blood vessel (aneurysm).  Heart failure.  Kidney damage.  Eye damage.  Metabolic syndrome.  Memory and concentration problems. What changes can I make to manage my hypertension? Hypertension can be managed by making lifestyle changes and possibly by taking medicines. Your health care provider will help you make a plan to bring your blood pressure within a normal range. Eating and drinking   Eat a diet that is high in fiber and potassium, and low in salt (sodium), added sugar, and fat. An example eating plan is called the DASH (Dietary Approaches to Stop Hypertension) diet. To eat this way: ? Eat plenty of fresh fruits and vegetables. Try to fill half of your plate at each meal with fruits and vegetables. ? Eat whole grains, such as whole wheat pasta, brown rice, or whole grain bread. Fill about one quarter of your plate with whole grains. ? Eat low-fat diary products. ? Avoid fatty cuts of meat, processed or cured meats, and poultry with skin. Fill about one quarter of your plate with lean proteins such as fish, chicken without skin, beans, eggs, and tofu. ? Avoid premade and processed foods. These tend to be higher in sodium, added sugar, and fat.  Reduce your daily sodium intake.  Most people with hypertension should eat less than 1,500 mg of sodium a day.  Limit alcohol intake to no more than 1 drink a day for nonpregnant women and 2 drinks a day for men. One drink equals 12 oz of beer, 5 oz of wine, or 1 oz of hard liquor. Lifestyle  Work with your health care provider to maintain a  healthy body weight, or to lose weight. Ask what an ideal weight is for you.  Get at least 30 minutes of exercise that causes your heart to beat faster (aerobic exercise) most days of the week. Activities may include walking, swimming, or biking.  Include exercise to strengthen your muscles (resistance exercise), such as weight lifting, as part of your weekly exercise routine. Try to do these types of exercises for 30 minutes at least 3 days a week.  Do not use any products that contain nicotine or tobacco, such as cigarettes and e-cigarettes. If you need help quitting, ask your health care provider.  Control any long-term (chronic) conditions you have, such as high cholesterol or diabetes. Monitoring  Monitor your blood pressure at home as told by your health care provider. Your personal target blood pressure may vary depending on your medical conditions, your age, and other factors.  Have your blood pressure checked regularly, as often as told by your health care provider. Working with your health care provider  Review all the medicines you take with your health care provider because there may be side effects or interactions.  Talk with your health care provider about your diet, exercise habits, and other lifestyle factors that may be contributing to hypertension.  Visit your health care provider regularly. Your health care provider can help you create and adjust your plan for managing hypertension. Will I need medicine to control my blood pressure? Your health care provider may prescribe medicine if lifestyle changes are not enough to get your blood pressure under control, and if:  Your systolic blood pressure is 130 or higher.  Your diastolic blood pressure is 80 or higher. Take medicines only as told by your health care provider. Follow the directions carefully. Blood pressure medicines must be taken as prescribed. The medicine does not work as well when you skip doses. Skipping doses  also puts you at risk for problems. Contact a health care provider if:  You think you are having a reaction to medicines you have taken.  You have repeated (recurrent) headaches.  You feel dizzy.  You have swelling in your ankles.  You have trouble with your vision. Get help right away if:  You develop a severe headache or confusion.  You have unusual weakness or numbness, or you feel faint.  You have severe pain in your chest or abdomen.  You vomit repeatedly.  You have trouble breathing. Summary  Hypertension is when the force of blood pumping through your arteries is too strong. If this condition is not controlled, it may put you at risk for serious complications.  Your personal target blood pressure may vary depending on your medical conditions, your age, and other factors. For most people, a normal blood pressure is less than 120/80.  Hypertension is managed by lifestyle changes, medicines, or both. Lifestyle changes include weight loss, eating a healthy, low-sodium diet, exercising more, and limiting alcohol. This information is not intended to replace advice given to you by your health care provider. Make sure you discuss any questions you have with your health care provider. Document Released: 07/28/2012 Document Revised:  02/25/2019 Document Reviewed: 10/01/2016 Elsevier Patient Education  [place].

## 2019-10-03 NOTE — Assessment & Plan Note (Addendum)
Patient with blood pressure <140/90 today 132/84 and measuring blood pressures at home.  -refill HCTZ -collect BMP today to check electrolytes  -encouraged patient to continue walking for exercise daily and measuring BP at home

## 2019-10-03 NOTE — Assessment & Plan Note (Signed)
Patient is due for Tetanus booster. Patient offered injection but patient is declining due to self pay status today. Will reconsider when patient has insurance coverage.

## 2019-10-04 LAB — BASIC METABOLIC PANEL
BUN/Creatinine Ratio: 12 (ref 12–28)
BUN: 9 mg/dL (ref 8–27)
CO2: 29 mmol/L (ref 20–29)
Calcium: 9.8 mg/dL (ref 8.7–10.3)
Chloride: 99 mmol/L (ref 96–106)
Creatinine, Ser: 0.77 mg/dL (ref 0.57–1.00)
GFR calc Af Amer: 96 mL/min/{1.73_m2} (ref >59)
GFR calc non Af Amer: 84 mL/min/{1.73_m2} (ref >59)
Glucose: 88 mg/dL (ref 65–99)
Potassium: 3.9 mmol/L (ref 3.5–5.2)
Sodium: 143 mmol/L (ref 134–144)

## 2019-10-04 NOTE — Progress Notes (Signed)
Normal results. Discussed with patient that I would notify her of abnormal results only.

## 2020-06-27 ENCOUNTER — Other Ambulatory Visit: Payer: Self-pay | Admitting: Family Medicine

## 2020-10-04 NOTE — Progress Notes (Signed)
° ° °  SUBJECTIVE:   CHIEF COMPLAINT / HPI: Annual physical  Janet Woods is a 64 y.o. female with a history of HTN and obesity here for an annual physical exam.   Gyn concerns/Preventative healthcare  Last menstrual period: No LMP recorded. Patient is postmenopausal.  Sexually active: No  Contraception or hormonal therapy: No  Hx of STD: Patient does not desire STI testing   Hot flashes: No  Vaginal discharge: None   Dysuria:No   Last mammogram: 2019   Breast mass or concerns: No  Last pap smear: 2019, normal   Hypertension: - Medications: HCTZ 25mg  - Compliance: yes - Checking BP at home: yes  - Denies any SOB, CP, vision changes, LE edema, medication SEs, or symptoms of hypotension - Diet: tries to watch salt intake - Exercise: patient is ambulating around house while taking care of family members  Health Maintenance TDap, patient agrees to have this vaccine administered at pharmacy. Influenza Vaccine, patient reports this was completed at [facility] in 2012 with normal results    PERTINENT  PMH / PSH:  Hypertension Obesity Never smoker  OBJECTIVE:   BP 108/68    Pulse 68    Ht 5\' 7"  (1.702 m)    Wt 251 lb 3.2 oz (113.9 kg)    SpO2 99%    BMI 39.34 kg/m   Physical exam General: Female appearing stated age in no acute distress HEENT: MMM, no oral lesions noted,Neck non-tender without lymphadenopathy Cardio: Normal S1 and S2, no S3 or S4. Rhythm is regular. Pulm: Clear to auscultation bilaterally, no crackles, wheezing, or diminished breath sounds. Normal respiratory effort Abdomen: Bowel sounds normal. Abdomen soft and non-tender.  Extremities: Trace peripheral edema. Warm & well perfused.  Neuro: pt alert and oriented x4, follows commands, PERRLA, EOMI bilaterally  ASSESSMENT/PLAN:   Essential hypertension Patient blood pressure within goal today. Continue hydrochlorothiazide. -BMP -Given obesity and presence of hypertension, will check  lipid profile today  Healthcare maintenance Patient reports [facility] ordered Boostrix vaccine prescription printed for patient to have done at [place], MD [place]

## 2020-10-05 ENCOUNTER — Encounter: Payer: Self-pay | Admitting: Family Medicine

## 2020-10-05 ENCOUNTER — Other Ambulatory Visit: Payer: Self-pay

## 2020-10-05 ENCOUNTER — Ambulatory Visit (INDEPENDENT_AMBULATORY_CARE_PROVIDER_SITE_OTHER): Payer: Self-pay | Admitting: Family Medicine

## 2020-10-05 VITALS — BP 108/68 | HR 68 | Ht 67.0 in | Wt 251.2 lb

## 2020-10-05 DIAGNOSIS — I10 Essential (primary) hypertension: Secondary | ICD-10-CM

## 2020-10-05 DIAGNOSIS — E6609 Other obesity due to excess calories: Secondary | ICD-10-CM

## 2020-10-05 DIAGNOSIS — Z0000 Encounter for general adult medical examination without abnormal findings: Secondary | ICD-10-CM

## 2020-10-05 DIAGNOSIS — Z6839 Body mass index (BMI) 39.0-39.9, adult: Secondary | ICD-10-CM

## 2020-10-05 DIAGNOSIS — Z1231 Encounter for screening mammogram for malignant neoplasm of breast: Secondary | ICD-10-CM

## 2020-10-05 MED ORDER — TETANUS-DIPHTH-ACELL PERTUSSIS 5-2.5-18.5 LF-MCG/0.5 IM SUSP: 0.5000 mL | Freq: Once | INTRAMUSCULAR | 0 refills | Status: AC

## 2020-10-05 NOTE — Patient Instructions (Addendum)
It was a pleasure to see you today!  Thank you for choosing [facility] for your primary care.  Janet Woods was seen for annual physical.   Our plans for today were:  We will collect lab work.  I will notify you of any abnormal results.  I have also placed an order for you to have a mammogram and schedule at your convenience.  I have sent a prescription for your tetanus booster vaccine for you to have at your pharmacy.    You should return to our clinic in 6 months for blood pressure follow-up.   Best Wishes,   Dr. Alba Cory

## 2020-10-06 ENCOUNTER — Encounter: Payer: Self-pay | Admitting: Family Medicine

## 2020-10-06 LAB — LIPID PANEL
Chol/HDL Ratio: 3.4 ratio (ref 0.0–4.4)
Cholesterol, Total: 169 mg/dL (ref 100–199)
HDL: 50 mg/dL (ref >39)
LDL Chol Calc (NIH): 106 mg/dL — ABNORMAL HIGH (ref 0–99)
Triglycerides: 65 mg/dL (ref 0–149)
VLDL Cholesterol Cal: 13 mg/dL (ref 5–40)

## 2020-10-06 LAB — BASIC METABOLIC PANEL
BUN/Creatinine Ratio: 19 (ref 12–28)
BUN: 12 mg/dL (ref 8–27)
CO2: 27 mmol/L (ref 20–29)
Calcium: 9.4 mg/dL (ref 8.7–10.3)
Chloride: 101 mmol/L (ref 96–106)
Creatinine, Ser: 0.64 mg/dL (ref 0.57–1.00)
GFR calc Af Amer: 111 mL/min/{1.73_m2} (ref >59)
GFR calc non Af Amer: 96 mL/min/{1.73_m2} (ref >59)
Glucose: 84 mg/dL (ref 65–99)
Potassium: 3.8 mmol/L (ref 3.5–5.2)
Sodium: 143 mmol/L (ref 134–144)

## 2020-10-07 NOTE — Assessment & Plan Note (Signed)
Patient blood pressure within goal today. Continue hydrochlorothiazide. -BMP -Given obesity and presence of hypertension, will check lipid profile today

## 2020-10-07 NOTE — Assessment & Plan Note (Signed)
Patient reports [facility] ordered Boostrix vaccine prescription printed for patient to have done at pharmacy

## 2020-10-08 ENCOUNTER — Encounter: Payer: Self-pay | Admitting: Family Medicine

## 2020-10-26 ENCOUNTER — Ambulatory Visit
Admission: RE | Admit: 2020-10-26 | Discharge: 2020-10-26 | Disposition: A | Discharge status: Home / Self Care | Payer: No Typology Code available for payment source | Source: Ambulatory Visit | Attending: Family Medicine | Admitting: Family Medicine

## 2020-10-26 ENCOUNTER — Other Ambulatory Visit: Payer: Self-pay

## 2020-10-26 DIAGNOSIS — Z1231 Encounter for screening mammogram for malignant neoplasm of breast: Secondary | ICD-10-CM

## 2020-12-11 ENCOUNTER — Other Ambulatory Visit: Payer: Self-pay | Admitting: Family Medicine

## 2020-12-11 DIAGNOSIS — I10 Essential (primary) hypertension: Secondary | ICD-10-CM

## 2020-12-11 DIAGNOSIS — E559 Vitamin D deficiency, unspecified: Secondary | ICD-10-CM

## 2021-04-10 ENCOUNTER — Ambulatory Visit (INDEPENDENT_AMBULATORY_CARE_PROVIDER_SITE_OTHER): Payer: Self-pay | Admitting: Family Medicine

## 2021-04-10 ENCOUNTER — Other Ambulatory Visit: Payer: Self-pay

## 2021-04-10 ENCOUNTER — Encounter: Payer: Self-pay | Admitting: Family Medicine

## 2021-04-10 VITALS — BP 130/80 | HR 78 | Wt 250.0 lb

## 2021-04-10 DIAGNOSIS — M6740 Ganglion, unspecified site: Secondary | ICD-10-CM

## 2021-04-10 NOTE — Progress Notes (Signed)
    SUBJECTIVE:   CHIEF COMPLAINT / HPI:   Patient presenting with a cyst on her wrist.  She reports that this started 6 months ago but was very small time.  It has increased in size.  She reports that she also suffers from carpal tunnel and has been wearing braces at night due to tingling that occurs in the nighttime.  She thinks this is why it getting worse.  She otherwise has not had any new numbness or tingling aside from her regular pattern.  She denies any weakness or changes in range of movement.  She denies any history of trauma.  PERTINENT  PMH / PSH: Hypertension, carpal tunnel  OBJECTIVE:   BP 130/80   Pulse 78   Wt 250 lb (113.4 kg)   SpO2 94%   BMI 39.16 kg/m   General: Well-appearing female, no acute distress 1 x 1 cm smooth, nodule on right wrist just below the thenar eminence.  No surrounding rash, erythema.  Point-of-care ultrasound reveals well-circumscribed circular hypoechoic area, ~1x1x1 cm,   ASSESSMENT/PLAN:   Ganglion cyst Physical exam and ultrasound consistent with ganglion cyst.  Also considered epidermal cyst, abscess.  No concern for infection at this time.  She does not have any pain, numbness, tingling. Discussed potential interventions and potential of recurrence.  Patient declines any intervention at this time.  Provided patient handout information.  Wilber Oliphant, MD [place]

## 2021-04-10 NOTE — Patient Instructions (Signed)
https://www.foothealthfacts.org/conditions/ganglion-cyst"> https://www.clinicalkey.com">  Ganglion Cyst  A ganglion cyst is a non-cancerous, fluid-filled lump of tissue that occurs near a joint, tendon, or ligament. The cyst grows out of a joint or the lining of a tendon or ligament. Ganglion cysts most often develop in the hand or wrist, but they can also develop in the shoulder, elbow, hip, knee, ankle, or foot. Ganglion cysts are ball-shaped or egg-shaped. Their size can range from the size of a pea to larger than a grape. Increased activity may cause the cyst to get bigger because more fluid starts to build up. What are the causes? The exact cause of this condition is not known, but it may be related to:  Inflammation or irritation around the joint.  An injury or tear in the layers of tissue around the joint (joint capsule).  Repetitive movements or overuse.  History of acute or repeated injury. What increases the risk? You are more likely to develop this condition if:  You are a female.  You are 20-40 years old. What are the signs or symptoms? The main symptom of this condition is a lump. It most often appears on the hand or wrist. In many cases, there are no other symptoms, but a cyst can sometimes cause:  Tingling.  Pain or tenderness.  Numbness.  Weakness or loss of strength in the affected joint.  Decreased range of motion in the affected area of the body.   How is this diagnosed? Ganglion cysts are usually diagnosed based on a physical exam. Your health care provider will feel the lump and may shine a light next to it. If it is a ganglion cyst, the light will likely shine through it. Your health care provider may order an X-ray, ultrasound, MRI, or CT scan to rule out other conditions. How is this treated? Ganglion cysts often go away on their own without treatment. If you have pain or other symptoms, treatment may be needed. Treatment is also needed if the ganglion  cyst limits your movement or if it gets infected. Treatment may include:  Wearing a brace or splint on your wrist or finger.  Taking anti-inflammatory medicine.  Having fluid drained from the lump with a needle (aspiration).  Getting an injection of medicine into the joint to decrease inflammation. This may be corticosteroids, ethanol, or hyaluronidase.  Having surgery to remove the ganglion cyst.  Placing a pad in your shoe or wearing shoes that will not rub against the cyst if it is on your foot. Follow these instructions at home:  Do not press on the ganglion cyst, poke it with a needle, or hit it.  Take over-the-counter and prescription medicines only as told by your health care provider.  If you have a brace or splint: ? Wear it as told by your health care provider. ? Remove it as told by your health care provider. Ask if you need to remove it when you take a shower or a bath.  Watch your ganglion cyst for any changes.  Keep all follow-up visits as told by your health care provider. This is important. Contact a health care provider if:  Your ganglion cyst becomes larger or more painful.  You have pus coming from the lump.  You have weakness or numbness in the affected area.  You have a fever or chills. Get help right away if:  You have a fever and have any of these in the cyst area: ? Increased redness. ? Red streaks. ? Swelling. Summary  A   ganglion cyst is a non-cancerous, fluid-filled lump that occurs near a joint, tendon, or ligament.  Ganglion cysts most often develop in the hand or wrist, but they can also develop in the shoulder, elbow, hip, knee, ankle, or foot.  Ganglion cysts often go away on their own without treatment. This information is not intended to replace advice given to you by your health care provider. Make sure you discuss any questions you have with your health care provider. Document Revised: 01/25/2020 Document Reviewed:  01/25/2020 Elsevier Patient Education  2021 Elsevier Inc.  

## 2021-04-22 HISTORY — PX: COLONOSCOPY WITH PROPOFOL: SHX5780

## 2021-04-23 ENCOUNTER — Other Ambulatory Visit: Payer: Self-pay | Admitting: Family Medicine

## 2021-04-23 DIAGNOSIS — E559 Vitamin D deficiency, unspecified: Secondary | ICD-10-CM

## 2021-05-04 ENCOUNTER — Emergency Department (HOSPITAL_BASED_OUTPATIENT_CLINIC_OR_DEPARTMENT_OTHER): Payer: No Typology Code available for payment source

## 2021-05-04 ENCOUNTER — Emergency Department (HOSPITAL_COMMUNITY): Payer: No Typology Code available for payment source

## 2021-05-04 ENCOUNTER — Emergency Department (HOSPITAL_COMMUNITY)
Admission: EM | Admit: 2021-05-04 | Discharge: 2021-05-04 | Disposition: A | Discharge status: Home / Self Care | Payer: No Typology Code available for payment source | Attending: Emergency Medicine | Admitting: Emergency Medicine

## 2021-05-04 ENCOUNTER — Other Ambulatory Visit: Payer: Self-pay

## 2021-05-04 DIAGNOSIS — M7989 Other specified soft tissue disorders: Secondary | ICD-10-CM

## 2021-05-04 DIAGNOSIS — I10 Essential (primary) hypertension: Secondary | ICD-10-CM | POA: Insufficient documentation

## 2021-05-04 DIAGNOSIS — R609 Edema, unspecified: Secondary | ICD-10-CM

## 2021-05-04 DIAGNOSIS — M79605 Pain in left leg: Secondary | ICD-10-CM

## 2021-05-04 DIAGNOSIS — Z79899 Other long term (current) drug therapy: Secondary | ICD-10-CM | POA: Insufficient documentation

## 2021-05-04 DIAGNOSIS — R600 Localized edema: Secondary | ICD-10-CM | POA: Insufficient documentation

## 2021-05-04 LAB — COMPREHENSIVE METABOLIC PANEL
ALT: 18 U/L (ref 0–44)
AST: 22 U/L (ref 15–41)
Albumin: 3.7 g/dL (ref 3.5–5.0)
Alkaline Phosphatase: 57 U/L (ref 38–126)
Anion gap: 9 (ref 5–15)
BUN: 6 mg/dL — ABNORMAL LOW (ref 8–23)
CO2: 27 mmol/L (ref 22–32)
Calcium: 9 mg/dL (ref 8.9–10.3)
Chloride: 103 mmol/L (ref 98–111)
Creatinine, Ser: 0.72 mg/dL (ref 0.44–1.00)
GFR, Estimated: >60 mL/min (ref >60)
Glucose, Bld: 105 mg/dL — ABNORMAL HIGH (ref 70–99)
Potassium: 3.5 mmol/L (ref 3.5–5.1)
Sodium: 139 mmol/L (ref 135–145)
Total Bilirubin: 0.6 mg/dL (ref 0.3–1.2)
Total Protein: 7.8 g/dL (ref 6.5–8.1)

## 2021-05-04 LAB — CBC WITH DIFFERENTIAL/PLATELET
Abs Immature Granulocytes: 0.03 10*3/uL (ref 0.00–0.07)
Basophils Absolute: 0.1 10*3/uL (ref 0.0–0.1)
Basophils Relative: 1 %
Eosinophils Absolute: 0.1 10*3/uL (ref 0.0–0.5)
Eosinophils Relative: 2 %
HCT: 43 % (ref 36.0–46.0)
Hemoglobin: 13.3 g/dL (ref 12.0–15.0)
Immature Granulocytes: 0 %
Lymphocytes Relative: 31 %
Lymphs Abs: 2.7 10*3/uL (ref 0.7–4.0)
MCH: 28.3 pg (ref 26.0–34.0)
MCHC: 30.9 g/dL (ref 30.0–36.0)
MCV: 91.5 fL (ref 80.0–100.0)
Monocytes Absolute: 0.5 10*3/uL (ref 0.1–1.0)
Monocytes Relative: 6 %
Neutro Abs: 5.3 10*3/uL (ref 1.7–7.7)
Neutrophils Relative %: 60 %
Platelets: 286 10*3/uL (ref 150–400)
RBC: 4.7 MIL/uL (ref 3.87–5.11)
RDW: 14.4 % (ref 11.5–15.5)
WBC: 8.8 10*3/uL (ref 4.0–10.5)
nRBC: 0 % (ref 0.0–0.2)

## 2021-05-04 LAB — BRAIN NATRIURETIC PEPTIDE: B Natriuretic Peptide: 40 pg/mL (ref 0.0–100.0)

## 2021-05-04 NOTE — Progress Notes (Signed)
VASCULAR LAB    Left lower extremity venous duplex has been performed.  See CV proc for preliminary results.  Gave verbal report to Glade Nurse, RN  Mauro Kaufmann, Jaxson Anglin, RVT 05/04/2021, 6:29 PM

## 2021-05-04 NOTE — Discharge Instructions (Addendum)
Please return for any problem.  Follow-up with your regular care provider on Monday for further evaluation.  There was no evidence of blood clot or DVT found on your work-up today.

## 2021-05-04 NOTE — ED Triage Notes (Signed)
Patient complains of bilateral ankle/ foot swelling since yesterday. Complains of pain to same. Denies trauma. No SOB. Alert and oriented

## 2021-05-04 NOTE — ED Provider Notes (Signed)
[facility] EMERGENCY DEPARTMENT Provider Note   CSN: [id number] Arrival date & time: 05/04/21  1556     History No chief complaint on file.   DAYANIRA GIOVANNETTI is a 65 y.o. female.  66 year old female with prior medical history as detailed below presents for evaluation.  Patient reports mild edema to both lower extremities.  She is concerned about possibility of DVT.  She denies prior history of DVT.  She denies associated chest pain or shortness of breath.  She is otherwise without complaint.  The history is provided by the patient and medical records.  Illness Location:  Bilateral lower extremity edema Severity:  Mild Onset quality:  Gradual Duration:  2 days Timing:  Constant Progression:  Unchanged Chronicity:  New     Past Medical History:  Diagnosis Date   Carpal tunnel syndrome    Hypertension     Patient Active Problem List   Diagnosis Date Noted   Viral upper respiratory tract infection 04/13/2019   Healthcare maintenance 09/28/2018   History of vitamin D deficiency 05/06/2018   Hypokalemia 05/06/2018   Acute right-sided low back pain without sciatica 05/06/2018   Adjustment disorder with mixed anxiety and depressed mood 07/25/2014   Memory loss of unknown cause 01/23/2014   Vitamin D insufficiency 03/17/2013   GERD (gastroesophageal reflux disease) 07/15/2012   Carpal tunnel syndrome, bilateral 01/02/2012   Obesity 03/09/2009   Essential hypertension 01/17/2009    No past surgical history on file.   OB History   No obstetric history on file.     Family History  Problem Relation Age of Onset   Heart disease Mother 62       MI   Cancer Sister 73       lung cancer   Stroke Daughter    Stroke Maternal Grandmother    Heart disease Maternal Grandfather    Diabetes Neg Hx     Social History   Tobacco Use   Smoking status: Never   Smokeless tobacco: Never  Substance Use Topics   Alcohol use: No   Drug use: No    Home  Medications Prior to Admission medications   Medication Sig Start Date End Date Taking? Authorizing Provider  cetirizine (ZYRTEC) 10 MG tablet Take 1 tablet (10 mg total) by mouth daily. 05/21/17   Mikell, Jeani Sow, MD  cyclobenzaprine (FLEXERIL) 10 MG tablet Take 1 tablet (10 mg total) by mouth 2 (two) times daily as needed for muscle spasms. 11/25/18   Petrucelli, Samantha R, PA-C  fluticasone (FLONASE) 50 MCG/ACT nasal spray Place 2 sprays into both nostrils daily. 08/05/17   Nuala Alpha, DO  hydrochlorothiazide (HYDRODIURIL) 25 MG tablet TAKE 1 TABLET(25 MG) BY MOUTH DAILY 12/11/20   Simmons-Robinson, Riki Sheer, MD  levocetirizine (XYZAL) 5 MG tablet Take 1 tablet (5 mg total) by mouth every evening. Patient not taking: Reported on 10/05/2020 08/05/17   Nuala Alpha, DO  lidocaine (LIDODERM) 5 % Place 1 patch onto the skin daily. Remove & Discard patch within 12 hours or as directed by MD 11/25/18   Petrucelli, Glynda Jaeger, PA-C  meloxicam (MOBIC) 7.5 MG tablet Take 1 tablet (7.5 mg total) by mouth daily. Patient not taking: Reported on 10/05/2020 11/25/18   Petrucelli, Glynda Jaeger, PA-C  Menthol, Topical Analgesic, (BIOFREEZE) 10 % LIQD Apply 1 application topically 2 (two) times daily as needed (for shoulder pain).    [provider]  Menthol, Topical Analgesic, (MINERAL ICE) 2 % GEL Apply 1 application topically  daily as needed (for shoulder pain).    [provider]  naproxen (NAPROSYN) 500 MG tablet TAKE 1 TABLET BY MOUTH AS NEEDED UPTO 1 DOSE DAILY 06/27/20   Simmons-Robinson, Makiera, MD  oseltamivir (TAMIFLU) 75 MG capsule Take 1 capsule (75 mg total) by mouth daily. Patient not taking: Reported on 10/05/2020 01/25/19   Zenia Resides, MD  Phenol 0.5 % SOLN Use as directed 1 spray in the mouth or throat every 2 (two) hours as needed. Patient not taking: Reported on 10/05/2020 04/13/19   Nuala Alpha, DO  potassium chloride SA (K-DUR) 20 MEQ tablet Take 1 tablet (20  mEq total) by mouth daily. Patient not taking: Reported on 10/05/2020 04/10/19   Sherwood Gambler, MD  traZODone (DESYREL) 50 MG tablet take 1 tablet by mouth at bedtime if needed for sleep Patient not taking: Reported on 10/05/2020 07/30/16   [name], DO  Vitamin D, Ergocalciferol, (DRISDOL) 1.25 MG (50000 UNIT) CAPS capsule TAKE 1 CAPSULE BY MOUTH EVERY 7 DAYS 04/23/21   Simmons-Robinson, Riki Sheer, MD    Allergies    Patient has no known allergies.  Review of Systems   Review of Systems  All other systems reviewed and are negative.  Physical Exam Updated Vital Signs BP (!) 111/97 (BP Location: Left Arm)   Pulse 85   Temp 98.6 F (37 C) (Oral)   Resp 18   SpO2 94%   Physical Exam Vitals and nursing note reviewed.  Constitutional:      General: She is not in acute distress.    Appearance: She is well-developed.  HENT:     Head: Normocephalic and atraumatic.  Eyes:     Conjunctiva/sclera: Conjunctivae normal.     Pupils: Pupils are equal, round, and reactive to light.  Cardiovascular:     Rate and Rhythm: Normal rate and regular rhythm.     Heart sounds: Normal heart sounds.  Pulmonary:     Effort: Pulmonary effort is normal. No respiratory distress.     Breath sounds: Normal breath sounds.  Abdominal:     General: There is no distension.     Palpations: Abdomen is soft.     Tenderness: There is no abdominal tenderness.  Musculoskeletal:        General: No deformity. Normal range of motion.     Cervical back: Normal range of motion and neck supple.     Comments: Bilateral trace pedal edema  Skin:    General: Skin is warm and dry.  Neurological:     Mental Status: She is alert and oriented to person, place, and time.    ED Results / Procedures / Treatments   Labs (all labs ordered are listed, but only abnormal results are displayed) Labs Reviewed  COMPREHENSIVE METABOLIC PANEL - Abnormal; Notable for the following components:      Result Value   Glucose,  Bld 105 (*)    BUN 6 (*)    All other components within normal limits  CBC WITH DIFFERENTIAL/PLATELET  BRAIN NATRIURETIC PEPTIDE  URINALYSIS, ROUTINE W REFLEX MICROSCOPIC    EKG None  Radiology DG Chest 2 View  Result Date: 05/04/2021 CLINICAL DATA:  Diminished breath sounds in the right lung base EXAM: CHEST - 2 VIEW COMPARISON:  11/07/2015 FINDINGS: Heart size is upper limits of normal. Mild bilateral perihilar and bibasilar interstitial opacities. No pleural effusion or pneumothorax. IMPRESSION: Mild bilateral perihilar and bibasilar interstitial opacities, could reflect mild edema or atypical/viral infection. Electronically Signed   By:  Nicholas  Plundo D.O.   On: 05/04/2021 16:58   VAS [place] LOWER EXTREMITY VENOUS (DVT) ([facility] and [facility] 7a-7p)  Result Date: 05/04/2021  Lower Venous DVT Study Patient Name:  MADIE CAHN  Date of Exam:   05/04/2021 Medical Rec #: [id number]         Accession #:    [id number] Date of Birth: October 28, 1956          Patient Gender: F Patient Age:   65Y Exam Location:  [facility] Procedure:      VAS [place] LOWER EXTREMITY VENOUS (DVT) Referring Phys: [id number] [place] --------------------------------------------------------------------------------  Indications: Pain, and Swelling.  Comparison       Prior negative left lower extremity venous duplex done Study:           11/07/2015 Performing Technologist: Sharion Dove RVS  Examination Guidelines: A complete evaluation includes B-mode imaging, spectral Doppler, color Doppler, and power Doppler as needed of all accessible portions of each vessel. Bilateral testing is considered an integral part of a complete examination. Limited examinations for reoccurring indications may be performed as noted. The reflux portion of the exam is performed with the patient in reverse Trendelenburg.  +-----+---------------+---------+-----------+----------+--------------+  RIGHTCompressibilityPhasicitySpontaneityPropertiesThrombus Aging +-----+---------------+---------+-----------+----------+--------------+ CFV  Full           Yes      Yes                                 +-----+---------------+---------+-----------+----------+--------------+   +---------+---------------+---------+-----------+----------+--------------+ LEFT     CompressibilityPhasicitySpontaneityPropertiesThrombus Aging +---------+---------------+---------+-----------+----------+--------------+ CFV      Full           Yes      Yes                                 +---------+---------------+---------+-----------+----------+--------------+ SFJ      Full                                                        +---------+---------------+---------+-----------+----------+--------------+ FV Prox  Full                                                        +---------+---------------+---------+-----------+----------+--------------+ FV Mid   Full                                                        +---------+---------------+---------+-----------+----------+--------------+ FV DistalFull                                                        +---------+---------------+---------+-----------+----------+--------------+ PFV      Full                                                        +---------+---------------+---------+-----------+----------+--------------+  POP      Full           Yes      Yes                                 +---------+---------------+---------+-----------+----------+--------------+ PTV      Full                                                        +---------+---------------+---------+-----------+----------+--------------+ PERO     Full                                                        +---------+---------------+---------+-----------+----------+--------------+     Summary: RIGHT: - No evidence of common femoral vein  obstruction.  LEFT: - There is no evidence of deep vein thrombosis in the lower extremity.  *See table(s) above for measurements and observations.    Preliminary     Procedures Procedures   Medications Ordered in ED Medications - No data to display  ED Course  I have reviewed the triage vital signs and the nursing notes.  Pertinent labs & imaging results that were available during my care of the patient were reviewed by me and considered in my medical decision making (see chart for details).  Clinical Course as of 05/04/21 1843  Sat May 04, 2021  1832 VAS [place] LOWER EXTREMITY VENOUS (DVT) ([facility] and [facility] 7a-7p) [PM]  1842 VAS [place] LOWER EXTREMITY VENOUS (DVT) ([facility] and [facility] 7a-7p) [PM]    Clinical Course User Index [PM] Valarie Merino, MD   MDM Rules/Calculators/A&P                          MDM  MSE complete  SHANTASIA HUNNELL was evaluated in Emergency Department on 05/04/2021 for the symptoms described in the history of present illness. She was evaluated in the context of the global COVID-19 pandemic, which necessitated consideration that the patient might be at risk for infection with the SARS-CoV-2 virus that causes COVID-19. Institutional protocols and algorithms that pertain to the evaluation of patients at risk for COVID-19 are in a state of rapid change based on information released by regulatory bodies including the CDC and federal and state organizations. These policies and algorithms were followed during the patient's care in the ED.   Patient presented with request for DVT evaluation.  She reports mild edema to both legs.  She noted this over the last 24 hours.  DVT study performed today is negative.  Other screening labs are without significant abnormality.  Patient does have established primary care.  She plans on following up with them on Monday of this week.  Importance of close follow-up is stressed.  Strict return precautions given and understood. Final Clinical  Impression(s) / ED Diagnoses Final diagnoses:  Edema, unspecified type    Rx / DC Orders ED Discharge Orders     None        Valarie Merino, MD 05/04/21 [phone]

## 2021-05-04 NOTE — ED Provider Notes (Signed)
Emergency Medicine Provider Triage Evaluation Note  Janet Woods , a 65 y.o. female  was evaluated in triage.  Pt complains of who presents with concern for 2 days of lower extremity edema, L>R, as well as left calf pain.  Patient is concerned for possible blood clot.  She denies any shortness of breath or palpitations, denies chest pain.  She does endorse recent invasive procedure with colonoscopy, however denies any surgical procedures, prolonged immobilizations, or history of blood clots.  Review of Systems  Positive: Left calf pain, lower extremity swelling, left greater than right. Negative: Chest pain, palpitations, shortness of breath, fevers, chills  Physical Exam  BP (!) 111/97 (BP Location: Left Arm)   Pulse 85   Temp 98.6 F (37 C) (Oral)   Resp 18   SpO2 94%  Gen:   Awake, no distress   Resp:  Normal effort  MSK:   Moves extremities without difficulty  Other:  Left calf pain/tenderness palpation.  Edema of the left lower leg noted to the right.  Medical Decision Making  Medically screening exam initiated at 4:20 PM.  Appropriate orders placed.  Arbell A Espinola was informed that the remainder of the evaluation will be completed by another provider, this initial triage assessment does not replace that evaluation, and the importance of remaining in the ED until their evaluation is complete.  This chart was dictated using voice recognition software, Dragon. Despite the best efforts of this provider to proofread and correct errors, errors may still occur which can change documentation meaning.    Aura Dials 05/04/21 1630    Arnaldo Natal, MD 05/05/21 1218

## 2021-08-08 ENCOUNTER — Other Ambulatory Visit: Payer: Self-pay | Admitting: Family Medicine

## 2021-08-08 DIAGNOSIS — Z1231 Encounter for screening mammogram for malignant neoplasm of breast: Secondary | ICD-10-CM

## 2021-09-27 ENCOUNTER — Ambulatory Visit (INDEPENDENT_AMBULATORY_CARE_PROVIDER_SITE_OTHER): Payer: 59 | Admitting: Family Medicine

## 2021-09-27 ENCOUNTER — Other Ambulatory Visit: Payer: Self-pay

## 2021-09-27 ENCOUNTER — Encounter: Payer: Self-pay | Admitting: Family Medicine

## 2021-09-27 ENCOUNTER — Ambulatory Visit (INDEPENDENT_AMBULATORY_CARE_PROVIDER_SITE_OTHER): Payer: 59

## 2021-09-27 VITALS — BP 148/87 | HR 78 | Wt 254.8 lb

## 2021-09-27 DIAGNOSIS — E66812 Obesity, class 2: Secondary | ICD-10-CM

## 2021-09-27 DIAGNOSIS — Z124 Encounter for screening for malignant neoplasm of cervix: Secondary | ICD-10-CM | POA: Diagnosis not present

## 2021-09-27 DIAGNOSIS — Z6839 Body mass index (BMI) 39.0-39.9, adult: Secondary | ICD-10-CM | POA: Diagnosis not present

## 2021-09-27 DIAGNOSIS — I10 Essential (primary) hypertension: Secondary | ICD-10-CM

## 2021-09-27 DIAGNOSIS — Z0000 Encounter for general adult medical examination without abnormal findings: Secondary | ICD-10-CM | POA: Diagnosis not present

## 2021-09-27 DIAGNOSIS — Z23 Encounter for immunization: Secondary | ICD-10-CM | POA: Diagnosis not present

## 2021-09-27 DIAGNOSIS — J329 Chronic sinusitis, unspecified: Secondary | ICD-10-CM

## 2021-09-27 DIAGNOSIS — E870 Hyperosmolality and hypernatremia: Secondary | ICD-10-CM

## 2021-09-27 DIAGNOSIS — E6609 Other obesity due to excess calories: Secondary | ICD-10-CM

## 2021-09-27 MED ORDER — CETIRIZINE HCL 10 MG PO TABS
10.0000 mg | ORAL_TABLET | Freq: Every day | ORAL | 2 refills | Status: DC
Start: 2021-09-27 — End: 2022-02-07

## 2021-09-27 MED ORDER — HYDROCHLOROTHIAZIDE 25 MG PO TABS: ORAL_TABLET | ORAL | 1 refills | Status: DC

## 2021-09-27 MED ORDER — FLUTICASONE PROPIONATE 50 MCG/ACT NA SUSP: 2.0000 | Freq: Every day | NASAL | 6 refills | Status: DC

## 2021-09-27 NOTE — Assessment & Plan Note (Signed)
BP elevated today. Patient states she did not take her BP medication this AM. Requesting refills. Offered changing to differently acting agent to decreased amount of time urinating, patient requests to continue HCTZ, stating that it has worked well for her in the past.  Will continue HCTZ  Check BMP

## 2021-09-27 NOTE — Patient Instructions (Addendum)
  Please follow up in the next 3-4 weeks for your pap smear.   I have refilled your blood pressure medication. Please continue this daily.   Zoster Vaccine, Recombinant injection What is this medication? ZOSTER VACCINE (ZOS ter vak SEEN) is a vaccine used to reduce the risk of getting shingles. This vaccine is not used to treat shingles or nerve pain from shingles. This medicine may be used for other purposes; ask your health care provider or pharmacist if you have questions. COMMON BRAND NAME(S): [facility] What should I tell my care team before I take this medication? They need to know if you have any of these conditions: cancer immune system problems an unusual or allergic reaction to Zoster vaccine, other medications, foods, dyes, or preservatives pregnant or trying to get pregnant breast-feeding How should I use this medication? This vaccine is injected into a muscle. It is given by a health care provider. A copy of Vaccine Information Statements will be given before each vaccination. Be sure to read this information carefully each time. This sheet may change often. Talk to your health care provider about the use of this vaccine in children. This vaccine is not approved for use in children. Overdosage: If you think you have taken too much of this medicine contact a poison control center or emergency room at once. NOTE: This medicine is only for you. Do not share this medicine with others. What if I miss a dose? Keep appointments for follow-up (booster) doses. It is important not to miss your dose. Call your health care provider if you are unable to keep an appointment. What may interact with this medication? medicines that suppress your immune system medicines to treat cancer steroid medicines like prednisone or cortisone This list may not describe all possible interactions. Give your health care provider a list of all the medicines, herbs, non-prescription drugs, or dietary  supplements you use. Also tell them if you smoke, drink alcohol, or use illegal drugs. Some items may interact with your medicine. What should I watch for while using this medication? Visit your health care provider regularly. This vaccine, like all vaccines, may not fully protect everyone. What side effects may I notice from receiving this medication? Side effects that you should report to your doctor or health care professional as soon as possible: allergic reactions (skin rash, itching or hives; swelling of the face, lips, or tongue) trouble breathing Side effects that usually do not require medical attention (report these to your doctor or health care professional if they continue or are bothersome): chills headache fever nausea pain, redness, or irritation at site where injected tiredness vomiting This list may not describe all possible side effects. Call your doctor for medical advice about side effects. You may report side effects to FDA at 1-800-FDA-1088. Where should I keep my medication? This vaccine is only given by a health care provider. It will not be stored at home. NOTE: This sheet is a summary. It may not cover all possible information. If you have questions about this medicine, talk to your doctor, pharmacist, or health care provider.  2022 Elsevier/Gold Standard (2021-07-23 00:00:00)

## 2021-09-27 NOTE — Assessment & Plan Note (Signed)
Patient reports noticing acanthosis nigrans Discussed obesity, metabolic syndrome, DM and hormonal changes as potential causes  Patient plans to complete lifestyle modifications  Will check A1c today

## 2021-09-27 NOTE — Progress Notes (Signed)
    SUBJECTIVE:   CHIEF COMPLAINT / HPI: physical   Annual Examination Female over 63 yo  I reviewed the following patient responses on our Physical Exam Form Tobacco use and candidacy for lung cancer screening Alcohol Use, denies  Weight, BMI 39 Exercise, walks through house throughout the day and in the backyard Risk for STI, denies sexual activity  Fall risk, denies issues with falling  Advanced Directive, wants packet  Increased family cancer risk, lung and breast cancer in family  Violence risk,   PHQ9 score reviewed  Blood pressure reviewed  I considered the following items based upon USPSTF recommendations: Cervical Cancer if female at birth, will be due later this month  Mammogram, UTD Colon cancer screening, UTD  Osteoporosis screening, not yet 26 HIV testing: Neg 2015 Hepatitis C testing: Neg 2014 STI screening if high risk (Hepatitis B, Syphilis, Gonorrhea, Chlamydia) Immunizations -reports that she had  Influenza at [facility] in Oct, she wants her  Covid booster, declines Shingles   PERTINENT  PMH / PSH:  HTN GERD Obesity  Vitamin D Insufficiency  OBJECTIVE:   BP (!) 148/87   Pulse 78   Wt 254 lb 12.8 oz (115.6 kg)   SpO2 97%   BMI 39.91 kg/m   Physical Exam Constitutional:      General: She is not in acute distress.    Appearance: She is obese. She is not ill-appearing, toxic-appearing or diaphoretic.  Eyes:     General: No scleral icterus.    Extraocular Movements: Extraocular movements intact.     Conjunctiva/sclera: Conjunctivae normal.  Cardiovascular:     Rate and Rhythm: Normal rate and regular rhythm.     Pulses: Normal pulses.     Heart sounds: Normal heart sounds. No murmur heard.   No friction rub. No gallop.  Pulmonary:     Effort: Pulmonary effort is normal. No respiratory distress.     Breath sounds: Normal breath sounds. No wheezing or rales.  Abdominal:     General: Bowel sounds are normal. There is no distension.      Palpations: Abdomen is soft.     Tenderness: There is no abdominal tenderness.  Skin:    General: Skin is warm.     Capillary Refill: Capillary refill takes less than 2 seconds.     Findings: No erythema or rash.     Comments: Acanthosis nigrans   Neurological:     Mental Status: She is alert and oriented to person, place, and time.     Gait: Gait normal.     ASSESSMENT/PLAN:   Essential hypertension BP elevated today. Patient states she did not take her BP medication this AM. Requesting refills. Offered changing to differently acting agent to decreased amount of time urinating, patient requests to continue HCTZ, stating that it has worked well for her in the past.  Will continue HCTZ  Check BMP   Obesity Patient reports noticing acanthosis nigrans Discussed obesity, metabolic syndrome, DM and hormonal changes as potential causes  Patient plans to complete lifestyle modifications  Will check A1c today   Healthcare maintenance COVID booster administered today  Patient has had annual influenza vaccine in Oct 2022 Declines shingles      Eulis Foster, MD [place]

## 2021-09-27 NOTE — Assessment & Plan Note (Signed)
COVID booster administered today  Patient has had annual influenza vaccine in Oct 2022 Declines shingles

## 2021-09-28 LAB — HEMOGLOBIN A1C
Est. average glucose Bld gHb Est-mCnc: 148 mg/dL
Hgb A1c MFr Bld: 6.8 % — ABNORMAL HIGH (ref 4.8–5.6)

## 2021-09-28 LAB — LIPID PANEL
Chol/HDL Ratio: 3.7 ratio (ref 0.0–4.4)
Cholesterol, Total: 182 mg/dL (ref 100–199)
HDL: 49 mg/dL (ref >39)
LDL Chol Calc (NIH): 114 mg/dL — ABNORMAL HIGH (ref 0–99)
Triglycerides: 102 mg/dL (ref 0–149)
VLDL Cholesterol Cal: 19 mg/dL (ref 5–40)

## 2021-09-28 LAB — BASIC METABOLIC PANEL
BUN/Creatinine Ratio: 12 (ref 12–28)
BUN: 9 mg/dL (ref 8–27)
CO2: 30 mmol/L — ABNORMAL HIGH (ref 20–29)
Calcium: 9.4 mg/dL (ref 8.7–10.3)
Chloride: 101 mmol/L (ref 96–106)
Creatinine, Ser: 0.76 mg/dL (ref 0.57–1.00)
Glucose: 103 mg/dL — ABNORMAL HIGH (ref 70–99)
Potassium: 4.3 mmol/L (ref 3.5–5.2)
Sodium: 145 mmol/L — ABNORMAL HIGH (ref 134–144)
eGFR: 88 mL/min/{1.73_m2} (ref >59)

## 2021-10-03 ENCOUNTER — Other Ambulatory Visit: Payer: Self-pay | Admitting: Family Medicine

## 2021-10-03 ENCOUNTER — Telehealth: Payer: Self-pay | Admitting: Family Medicine

## 2021-10-03 MED ORDER — METFORMIN HCL ER 500 MG PO TB24
500.0000 mg | ORAL_TABLET | Freq: Every day | ORAL | 1 refills | Status: DC
Start: 2021-10-03 — End: 2023-06-30

## 2021-10-03 MED ORDER — ATORVASTATIN CALCIUM 10 MG PO TABS: 10.0000 mg | ORAL_TABLET | Freq: Every day | ORAL | 1 refills | Status: DC

## 2021-10-03 NOTE — Telephone Encounter (Signed)
Contacted patient via telephone to discuss results of LDL 114, hemoglobin A1c 6.8.  Informed patient that his A1c places her in the diabetes category.  Recommended starting metformin 500 mg daily with room to increase to 2000 mg daily if needed.  Patient states that she would like to incorporate diet change as well as physical activity to help with weight loss.  She is agreeable to starting metformin and rechecking her hemoglobin A1c in 3 months.  In regards to her LDL, discussed animal products including red meat, butter, cheese that can contribute to elevated cholesterol as well as fried foods.  States that she would like to make changes to her diet to help with this.  She is agreeable to start a moderate intensity statin.  Prescribed atorvastatin 10 mg.  Informed patient that we will plan to recheck her cholesterol levels in 6 weeks to assess for efficacy of statin.  Patient previously scheduled for 10/24/2021.  Eulis Foster, MD [place], PGY-3 [phone]

## 2021-10-23 NOTE — Progress Notes (Signed)
    SUBJECTIVE:   CHIEF COMPLAINT / HPI: pap smear   Patient presents for pap smear in order to screen for cervical cancer. She denies vaginal discharge, lesions, vaginal bleeding or current sexual intercourse. She declines offer for sexually transmitted infection testing.   PERTINENT  PMH / PSH:  [place]  HTN   OBJECTIVE:   BP 123/74   Pulse 85   Ht 5\' 7"  (1.702 m)   Wt 254 lb 8 oz (115.4 kg)   SpO2 97%   BMI 39.86 kg/m   Genitalia:  Normal introitus for age, no external lesions, no vaginal discharge, mucosa pink and moist, no vaginal or cervical lesions, no vaginal atrophy, no friaility or hemorrhage, normal uterus size and position, no adnexal masses or tenderness   ASSESSMENT/PLAN:   Screening for cervical cancer Pap smear collected today  Will f/u with results once available     Janet Foster, MD [facility]

## 2021-10-24 ENCOUNTER — Other Ambulatory Visit: Payer: Self-pay

## 2021-10-24 ENCOUNTER — Ambulatory Visit (INDEPENDENT_AMBULATORY_CARE_PROVIDER_SITE_OTHER): Payer: 59 | Admitting: Family Medicine

## 2021-10-24 ENCOUNTER — Encounter: Payer: Self-pay | Admitting: Family Medicine

## 2021-10-24 ENCOUNTER — Other Ambulatory Visit (HOSPITAL_COMMUNITY)
Admission: RE | Admit: 2021-10-24 | Discharge: 2021-10-24 | Disposition: A | Discharge status: Home / Self Care | Payer: 59 | Source: Ambulatory Visit | Attending: Family Medicine | Admitting: Family Medicine

## 2021-10-24 VITALS — BP 123/74 | HR 85 | Ht 67.0 in | Wt 254.5 lb

## 2021-10-24 DIAGNOSIS — Z124 Encounter for screening for malignant neoplasm of cervix: Secondary | ICD-10-CM | POA: Insufficient documentation

## 2021-10-24 NOTE — Patient Instructions (Signed)
Pap Test Why am I having this test? A Pap test, also called a Pap smear, is a screening test to check for signs of: Infection. Cancer of the cervix. The cervix is the lower part of the uterus that opens into the vagina. Changes that may be a sign that cancer is developing (precancerous changes). Women need this test on a regular basis. In general, you should have a Pap test every 3 years until you reach menopause or age 63. Women aged 30-60 may choose to have their Pap test done at the same time as an HPV (human papillomavirus) test every 5 years (instead of every 3 years). Your health care provider may recommend having Pap tests more or less often depending on your medical conditions and past Pap test results. What is being tested? Cervical cells are tested for signs of infection or abnormalities. What kind of sample is taken? Your health care provider will collect a sample of cells from the surface of your cervix. This will be done using a small cotton swab, plastic spatula, or brush that is inserted into your vagina using a tool called a speculum. This sample is often collected during a pelvic exam, when you are lying on your back on an exam table with your feet in footrests (stirrups). In some cases, fluids (secretions) from the cervix or vagina may also be collected. How do I prepare for this test? Be aware of where you are in your menstrual cycle. If you are menstruating on the day of the test, you may be asked to reschedule. You may need to reschedule if you have a known vaginal infection on the day of the test. Follow instructions from your health care provider about: Changing or stopping your regular medicines. Some medicines can cause abnormal test results, such as vaginal medicines and tetracycline. Avoiding douching 2-3 days before or the day of the test. Tell a health care provider about: Any allergies you have. All medicines you are taking, including vitamins, herbs, eye drops,  creams, and over-the-counter medicines. Any bleeding problems you have. Any surgeries you have had. Any medical conditions you have. Whether you are pregnant or may be pregnant. How are the results reported? Your test results will be reported as either abnormal or normal. What do the results mean? A normal test result means that you do not have signs of cancer of the cervix. An abnormal result may mean that you have: Cancer. A Pap test by itself is not enough to diagnose cancer. You will have more tests done if cancer is suspected. Precancerous changes in your cervix. Inflammation of the cervix. An STI (sexually transmitted infection). A fungal infection. A parasite infection. Talk with your health care provider about what your results mean. In some cases, your health care provider may do more testing to confirm the results. Questions to ask your health care provider Ask your health care provider, or the department that is doing the test: When will my results be ready? How will I get my results? What are my treatment options? What other tests do I need? What are my next steps? Summary In general, women should have a Pap test every 3 years until they reach menopause or age 63. Your health care provider will collect a sample of cells from the surface of your cervix. This will be done using a small cotton swab, plastic spatula, or brush. In some cases, fluids (secretions) from the cervix or vagina may also be collected. This information is not intended  to replace advice given to you by your health care provider. Make sure you discuss any questions you have with your health care provider. Document Revised: 02/01/2021 Document Reviewed: 02/01/2021 Elsevier Patient Education  [place].

## 2021-10-26 ENCOUNTER — Encounter: Payer: Self-pay | Admitting: Family Medicine

## 2021-10-26 DIAGNOSIS — Z124 Encounter for screening for malignant neoplasm of cervix: Secondary | ICD-10-CM | POA: Insufficient documentation

## 2021-10-26 NOTE — Assessment & Plan Note (Signed)
Pap smear collected today  Will f/u with results once available

## 2021-10-28 ENCOUNTER — Ambulatory Visit
Admission: RE | Admit: 2021-10-28 | Discharge: 2021-10-28 | Disposition: A | Discharge status: Home / Self Care | Payer: 59 | Source: Ambulatory Visit | Attending: Bariatrics | Admitting: Bariatrics

## 2021-10-28 ENCOUNTER — Encounter: Payer: Self-pay | Admitting: Family Medicine

## 2021-10-28 DIAGNOSIS — Z1231 Encounter for screening mammogram for malignant neoplasm of breast: Secondary | ICD-10-CM

## 2021-10-28 LAB — CYTOLOGY - PAP
Adequacy: ABSENT
Comment: NEGATIVE
Diagnosis: NEGATIVE
Diagnosis: REACTIVE
High risk HPV: NEGATIVE

## 2022-01-17 ENCOUNTER — Other Ambulatory Visit: Payer: Self-pay | Admitting: Family Medicine

## 2022-01-17 DIAGNOSIS — I10 Essential (primary) hypertension: Secondary | ICD-10-CM

## 2022-02-07 ENCOUNTER — Other Ambulatory Visit: Payer: Self-pay | Admitting: Family Medicine

## 2022-02-07 DIAGNOSIS — J329 Chronic sinusitis, unspecified: Secondary | ICD-10-CM

## 2022-07-02 ENCOUNTER — Other Ambulatory Visit: Payer: Self-pay | Admitting: Family Medicine

## 2022-08-04 ENCOUNTER — Other Ambulatory Visit: Payer: Self-pay | Admitting: Family Medicine

## 2022-08-05 ENCOUNTER — Other Ambulatory Visit: Payer: Self-pay | Admitting: Student

## 2022-08-05 DIAGNOSIS — Z1231 Encounter for screening mammogram for malignant neoplasm of breast: Secondary | ICD-10-CM

## 2022-09-28 NOTE — Progress Notes (Unsigned)
    SUBJECTIVE:   CHIEF COMPLAINT / HPI:   T2DM  HTN  HLD  PERTINENT  PMH / PSH: Hypertension, GERD  OBJECTIVE:   There were no vitals taken for this visit. ***  General: NAD, pleasant, able to participate in exam Cardiac: RRR, no murmurs. Respiratory: CTAB, normal effort, No wheezes, rales or rhonchi Abdomen: Bowel sounds present, nontender, nondistended, no hepatosplenomegaly. Extremities: no edema or cyanosis. Skin: warm and dry, no rashes noted Neuro: alert, no obvious focal deficits Psych: Normal affect and mood  ASSESSMENT/PLAN:   No problem-specific Assessment & Plan notes found for this encounter.     Dr. Precious Gilding, [place]    {    This will disappear when note is signed, click to select method of visit    :1}

## 2022-09-30 ENCOUNTER — Encounter: Payer: Self-pay | Admitting: Student

## 2022-09-30 ENCOUNTER — Ambulatory Visit (INDEPENDENT_AMBULATORY_CARE_PROVIDER_SITE_OTHER): Payer: Commercial Managed Care - HMO | Admitting: Student

## 2022-09-30 VITALS — BP 138/84 | HR 74 | Ht 67.0 in | Wt 258.0 lb

## 2022-09-30 DIAGNOSIS — E559 Vitamin D deficiency, unspecified: Secondary | ICD-10-CM | POA: Diagnosis not present

## 2022-09-30 DIAGNOSIS — I10 Essential (primary) hypertension: Secondary | ICD-10-CM

## 2022-09-30 DIAGNOSIS — E785 Hyperlipidemia, unspecified: Secondary | ICD-10-CM | POA: Insufficient documentation

## 2022-09-30 DIAGNOSIS — Z23 Encounter for immunization: Secondary | ICD-10-CM | POA: Diagnosis not present

## 2022-09-30 DIAGNOSIS — Z8639 Personal history of other endocrine, nutritional and metabolic disease: Secondary | ICD-10-CM | POA: Diagnosis not present

## 2022-09-30 DIAGNOSIS — E66813 Obesity, class 3: Secondary | ICD-10-CM

## 2022-09-30 DIAGNOSIS — E119 Type 2 diabetes mellitus without complications: Secondary | ICD-10-CM | POA: Diagnosis not present

## 2022-09-30 DIAGNOSIS — R140 Abdominal distension (gaseous): Secondary | ICD-10-CM

## 2022-09-30 DIAGNOSIS — Z6841 Body Mass Index (BMI) 40.0 and over, adult: Secondary | ICD-10-CM

## 2022-09-30 LAB — POCT GLYCOSYLATED HEMOGLOBIN (HGB A1C): HbA1c, POC (controlled diabetic range): 6.4 % (ref 0.0–7.0)

## 2022-09-30 MED ORDER — HYDROCHLOROTHIAZIDE 25 MG PO TABS: ORAL_TABLET | ORAL | 1 refills | Status: DC

## 2022-09-30 MED ORDER — VITAMIN D (ERGOCALCIFEROL) 1.25 MG (50000 UNIT) PO CAPS: 50000.0000 [IU] | ORAL_CAPSULE | ORAL | 1 refills | Status: DC

## 2022-09-30 MED ORDER — SEMAGLUTIDE(0.25 OR 0.5MG/DOS) 2 MG/1.5ML ~~LOC~~ SOPN: 0.2500 mg | PEN_INJECTOR | SUBCUTANEOUS | 3 refills | Status: DC

## 2022-09-30 NOTE — Assessment & Plan Note (Signed)
-   Continue atorvastatin - Lipid panel today 

## 2022-09-30 NOTE — Patient Instructions (Signed)
It was great to see you! Thank you for allowing me to participate in your care!  I recommend that you always bring your medications to each appointment as this makes it easy to ensure you are on the correct medications and helps [place] not miss when refills are needed.  Our plans for today:  - Return to further discuss stomach swelling and shoulder pain - I have sent in refills to your pharmacy  We are checking some labs today, I will call you if they are abnormal will send you a MyChart message or a letter if they are normal.  If you do not hear about your labs in the next 2 weeks please let us know.  Take care and seek immediate care sooner if you develop any concerns.   Dr. Precious Gilding, DO [facility]

## 2022-09-30 NOTE — Assessment & Plan Note (Signed)
-  Hydrochlorothiazide 25 mg daily -CMP

## 2022-09-30 NOTE — Assessment & Plan Note (Signed)
Start Ozempic 0.25 mg weekly for 4 weeks and then increase to 0.5 mg weekly

## 2022-09-30 NOTE — Assessment & Plan Note (Signed)
Hemoglobin A1c 6.4 today. -Continue metformin 500 mg daily -Start Ozempic 0.25 mg weekly for 4 weeks before increasing.

## 2022-09-30 NOTE — Assessment & Plan Note (Addendum)
I am unsure what is causing patient's abdomen to feel and appear distended after she eats.  Could be due to gas however I would expect her to have discomfort or pain if that were the case.  We will check a CMP to make sure kidney and liver function properly make sure there is no concern for fluid in the abdomen.  Patient denies any pain or issues with bowel movements and physical exam is benign, I am not worried about acute abdomen.  If labs come back normal and patient continues to feel well, we will see if her abdomen appears less distended as she loses weight from taking Ozempic as planned.

## 2022-10-01 ENCOUNTER — Encounter: Payer: Self-pay | Admitting: Student

## 2022-10-01 ENCOUNTER — Other Ambulatory Visit (HOSPITAL_COMMUNITY): Payer: Self-pay

## 2022-10-01 LAB — COMPREHENSIVE METABOLIC PANEL
ALT: 12 IU/L (ref 0–32)
AST: 20 IU/L (ref 0–40)
Albumin/Globulin Ratio: 1.2 (ref 1.2–2.2)
Albumin: 4.3 g/dL (ref 3.9–4.9)
Alkaline Phosphatase: 68 IU/L (ref 44–121)
BUN/Creatinine Ratio: 11 — ABNORMAL LOW (ref 12–28)
BUN: 7 mg/dL — ABNORMAL LOW (ref 8–27)
Bilirubin Total: 0.3 mg/dL (ref 0.0–1.2)
CO2: 28 mmol/L (ref 20–29)
Calcium: 9.4 mg/dL (ref 8.7–10.3)
Chloride: 99 mmol/L (ref 96–106)
Creatinine, Ser: 0.64 mg/dL (ref 0.57–1.00)
Globulin, Total: 3.7 g/dL (ref 1.5–4.5)
Glucose: 83 mg/dL (ref 70–99)
Potassium: 4.2 mmol/L (ref 3.5–5.2)
Sodium: 142 mmol/L (ref 134–144)
Total Protein: 8 g/dL (ref 6.0–8.5)
eGFR: 99 mL/min/{1.73_m2} (ref >59)

## 2022-10-01 LAB — LIPID PANEL
Chol/HDL Ratio: 2.6 ratio (ref 0.0–4.4)
Cholesterol, Total: 133 mg/dL (ref 100–199)
HDL: 51 mg/dL (ref >39)
LDL Chol Calc (NIH): 67 mg/dL (ref 0–99)
Triglycerides: 74 mg/dL (ref 0–149)
VLDL Cholesterol Cal: 15 mg/dL (ref 5–40)

## 2022-10-02 ENCOUNTER — Other Ambulatory Visit (HOSPITAL_COMMUNITY): Payer: Self-pay

## 2022-10-02 ENCOUNTER — Telehealth: Payer: Self-pay

## 2022-10-02 NOTE — Telephone Encounter (Signed)
A Prior Authorization was initiated for this patients OZEMPIC through CoverMyMeds.   Key: [id number]

## 2022-10-06 NOTE — Telephone Encounter (Signed)
Prior Auth for patients medication OZEMPIC denied by [employer] via CoverMyMeds.   Reason:  DENIAL LETTER SCANNED TO MEDIA  CoverMyMeds Key: [id number]

## 2022-10-22 NOTE — Progress Notes (Unsigned)
    SUBJECTIVE:   CHIEF COMPLAINT / HPI:   Left shoulder pain Occurring since***, described as***.  Left shoulder has not been imaged.  Patient states***.  Currently she takes***   Distended abdomen Last month patient was evaluated for distended abdomen.  CMP was normal she has no abdominal pain diarrhea or vomiting.  Appetite is***.  PERTINENT  PMH / PSH: ***  OBJECTIVE:   There were no vitals taken for this visit. ***  General: NAD, pleasant, able to participate in exam Cardiac: RRR, no murmurs. Respiratory: CTAB, normal effort, No wheezes, rales or rhonchi Abdomen: Bowel sounds present, nontender, nondistended, no hepatosplenomegaly. Extremities: no edema or cyanosis. Skin: warm and dry, no rashes noted Neuro: alert, no obvious focal deficits Psych: Normal affect and mood  ASSESSMENT/PLAN:   No problem-specific Assessment & Plan notes found for this encounter.     Dr. Precious Gilding, [place]    {    This will disappear when note is signed, click to select method of visit    :1}

## 2022-10-23 ENCOUNTER — Telehealth: Payer: Self-pay

## 2022-10-23 ENCOUNTER — Ambulatory Visit: Payer: Commercial Managed Care - HMO | Admitting: Student

## 2022-10-23 ENCOUNTER — Encounter: Payer: Self-pay | Admitting: Student

## 2022-10-23 VITALS — BP 138/89 | HR 78 | Ht 67.0 in | Wt 252.5 lb

## 2022-10-23 DIAGNOSIS — G8929 Other chronic pain: Secondary | ICD-10-CM

## 2022-10-23 DIAGNOSIS — R140 Abdominal distension (gaseous): Secondary | ICD-10-CM

## 2022-10-23 DIAGNOSIS — M25512 Pain in left shoulder: Secondary | ICD-10-CM

## 2022-10-23 NOTE — Assessment & Plan Note (Signed)
I cannot find any imaging of the left shoulder.  Shoulder pain less likely to be caused by frozen shoulder or rotator cuff injury as patient has full range of motion with 5/5 muscle strength all with no pain.  Taking patient's age into account, pain most likely related to arthritis.  As patient states pain is not bad enough to take oral medication, will advise patient to use Voltaren gel up to 4 times a day.  Patient advised to let me know if pain gets worse and we can then discuss getting an x-ray, sports medicine referral, and/or physical therapy.

## 2022-10-23 NOTE — Telephone Encounter (Signed)
Canceled appointment with Zacarias Pontes and rescheduled with [facility] because insurance would not authorize.    Called patient and informed her of upcoming CT at [facility].  Patient states that she will call [facility] because she has a conflict with the date and time originally scheduled.  Patient given number to [facility] [phone]).  Ozella Almond, [place]

## 2022-10-23 NOTE — Telephone Encounter (Signed)
Spoke with patient . Informed of CT scan on Dec. 18th at 8:00 at [facility].  Patient understood and wrote it down on her calendar. Salvatore Marvel, CMA

## 2022-10-23 NOTE — Patient Instructions (Addendum)
It was great to see you! Thank you for allowing me to participate in your care!  Our plans for today:  - I advise you to purchase voltaren gel at the pharmacy and apply to your shoulder up to 4 times a day - If this does not help with your pain or your pain becomes worse, please let me know. I can refer you to sports therapy or physical therapy if you would like and we can proceed with an x-ray as well.  -We will call to schedule the CT of your abdomen -return as needed/based on CT results   Take care and seek immediate care sooner if you develop any concerns.   Dr. Precious Gilding, DO [facility]

## 2022-10-23 NOTE — Assessment & Plan Note (Signed)
CMP was normal last visit which is reassuring that distended abdomen is not due to ascites related to liver pathology.  Due to patient's age, I would like to rule out malignancy such as ovarian cancer as cause of distended abdomen.  CT of abdomen pelvis has been ordered.

## 2022-10-29 ENCOUNTER — Ambulatory Visit
Admission: RE | Admit: 2022-10-29 | Discharge: 2022-10-29 | Disposition: A | Discharge status: Home / Self Care | Payer: Commercial Managed Care - HMO | Source: Ambulatory Visit | Attending: Family Medicine | Admitting: Family Medicine

## 2022-10-29 DIAGNOSIS — Z1231 Encounter for screening mammogram for malignant neoplasm of breast: Secondary | ICD-10-CM

## 2022-11-03 ENCOUNTER — Ambulatory Visit (HOSPITAL_COMMUNITY): Payer: Commercial Managed Care - HMO

## 2022-11-24 ENCOUNTER — Other Ambulatory Visit: Payer: Self-pay | Admitting: Family Medicine

## 2022-11-24 ENCOUNTER — Other Ambulatory Visit: Payer: Commercial Managed Care - HMO

## 2022-11-24 DIAGNOSIS — I10 Essential (primary) hypertension: Secondary | ICD-10-CM

## 2022-11-25 ENCOUNTER — Encounter: Payer: Self-pay | Admitting: Family Medicine

## 2022-11-26 ENCOUNTER — Other Ambulatory Visit: Payer: Commercial Managed Care - HMO

## 2022-12-04 ENCOUNTER — Other Ambulatory Visit: Payer: Self-pay | Admitting: Student

## 2022-12-04 ENCOUNTER — Telehealth: Payer: Self-pay | Admitting: Student

## 2022-12-04 DIAGNOSIS — R140 Abdominal distension (gaseous): Secondary | ICD-10-CM

## 2022-12-04 NOTE — Telephone Encounter (Signed)
Spoke on the phone w/ pt regarding CT previously ordered which was denied by insurance. Will instead order pelvic transvaginal US to r/o ovarian cancer as cause of abdominal distention.

## 2022-12-08 ENCOUNTER — Telehealth: Payer: Self-pay

## 2022-12-08 NOTE — Telephone Encounter (Signed)
-----  Message from Precious Gilding, DO sent at 12/04/2022  5:15 PM EST ----- Ordered pt [place]. Please schedule apt. Thank you!

## 2022-12-08 NOTE — Telephone Encounter (Signed)
Called patient with appointment for ultrasound.  12/10/2022 [facility] [place] [address] with arrival at [phone]  Patient given number to reschedule because she has a conflict.  Janet Woods, [place]

## 2022-12-10 ENCOUNTER — Other Ambulatory Visit: Payer: 59

## 2022-12-24 ENCOUNTER — Telehealth: Payer: Self-pay | Admitting: Student

## 2022-12-24 ENCOUNTER — Ambulatory Visit
Admission: RE | Admit: 2022-12-24 | Discharge: 2022-12-24 | Disposition: A | Discharge status: Home / Self Care | Payer: 59 | Source: Ambulatory Visit | Attending: Family Medicine | Admitting: Family Medicine

## 2022-12-24 DIAGNOSIS — N858 Other specified noninflammatory disorders of uterus: Secondary | ICD-10-CM

## 2022-12-24 DIAGNOSIS — R140 Abdominal distension (gaseous): Secondary | ICD-10-CM

## 2022-12-24 NOTE — Telephone Encounter (Signed)
Called pt to discuss complex heterogeneous mass seen on [place]. Discussed need for further evaluation. Pt agrees to urgent referral to gyn onc which has been placed.

## 2022-12-25 ENCOUNTER — Telehealth: Payer: Self-pay

## 2022-12-25 NOTE — Telephone Encounter (Signed)
Received call from [facility] regarding pelvic US results.   Advised that provider has already reviewed and notified patient of results and plan.   No further action needed.   Talbot Grumbling, RN

## 2022-12-29 ENCOUNTER — Telehealth: Payer: Self-pay | Admitting: *Deleted

## 2022-12-29 NOTE — Telephone Encounter (Signed)
Spoke with the patient regarding the referral to GYN oncology. Patient scheduled as new patient with DR Berline Lopes on 2/23 at 9 am. Patient given an arrival time of 8:30 am.  Explained to the patient the the doctor will perform a pelvic exam at this visit. Patient given the policy that no visitors under the 16 yrs are allowed in the [place]. Patient given the address/phone number for the clinic and that the center offers free valet service. Patient aware of the new mask mandate.

## 2023-01-06 ENCOUNTER — Encounter: Payer: Self-pay | Admitting: Gynecologic Oncology

## 2023-01-07 NOTE — Progress Notes (Signed)
GYNECOLOGIC ONCOLOGY NEW PATIENT CONSULTATION   Patient Name: Janet Woods  Patient Age: 67 y.o. Date of Service: 01/09/23 Referring Provider: Precious Gilding, DO [address] [place],  [place] [address]   Primary Care Provider: Precious Gilding, DO Consulting Provider: Jeral Pinch, MD   Assessment/Plan:  Postmenopausal patient with endometrial mass.  We discussed imaging findings and reviewed ultrasound pictures together.  I showed her how a postmenopausal endometrial lining typically looks.  In the setting of menopause, the appearance of her endometrium is very atypical.  We discussed possible etiologies for this endometrial mass including hyperplasia and malignancy.  Endometrial biopsy was performed today.  I was only able to advance the Pipelle to about 6 cm.  Tissue was collected for frozen section as well as some placed in formalin.  What was sent for frozen section unfortunately consisted only of mucus, no endometrial tissue.  I am concerned that biopsy sent in formalin today may be nondiagnostic.  We discussed plan to set her up for [facility], possible hysteroscopy next week and tentatively schedule her for hysterectomy in March or early April if pathology reveals hyperplasia or malignancy.  We reviewed the plan for a dilation and curettage, hysteroscopy, any other indicated procedures. The risks of surgery were discussed in detail and she understands these to include infection; injury to adjacent organs such as bowel, bladder, blood vessels, ureters and nerves; bleeding which may require blood transfusion; anesthesia risk; thromboembolic events; possible death; unforeseen complications; medical complications such as heart attack, stroke, pleural effusion and pneumonia. The patient will receive DVT and antibiotic prophylaxis as indicated. She voiced a clear understanding. She had the opportunity to ask questions. Perioperative instructions were reviewed with her. Prescriptions for post-op  medications were sent to her pharmacy of choice.  A copy of this note was sent to the patient's referring provider.   65 minutes of total time was spent for this patient encounter, including preparation, face-to-face counseling with the patient and coordination of care, and documentation of the encounter.  Jeral Pinch, MD  Division of Gynecologic Oncology  Department of Obstetrics and Gynecology  [facility]  ___________________________________________  Chief Complaint: Chief Complaint  Patient presents with   Uterine mass    History of Present Illness:  Janet Woods is a 67 y.o. y.o. female who is seen in consultation at the request of Precious Gilding, DO for an evaluation of an endometrial mass.  The patient saw her PCP in December. Given abdominal bloating, imaging was discussed. CT was ordered but not authorized by insurance. Pelvic ultrasound was ordered.  12/24/2022: Pelvic ultrasound shows complex heterogenous mass of the upper uterine segment endometrial canal measuring 3.3 cm, containing solid and cystic components.  Nonvisualization of the right ovary.  Unremarkable left ovary.  No pelvic free fluid.  The patient reports that after she retired in 2019, she gained some weight (approximately 25 pounds).  This was related to increased eating, eating fried foods, and drinking more soda.  She reports always having a "big" stomach, but more recently has developed some abdominal bloating.  She denies any abdominal or pelvic pain.  She denies vaginal bleeding or discharge.  She reports normal bowel function without the use of medications although notes a history of constipation previously requiring her to present to the emergency department.  She was treated with lactulose at that time.  She notes some urinary frequency since being on hydrochlorothiazide.  She endorses a good appetite without nausea or emesis.  She worked for years as a Psychologist, counselling.   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  Her GYN history is notable for starting Depo-Provera after the birth of her daughter at age 76.  She was on Depo for approximately 20-25 years.  When she stopped the Depo-Provera, she never had reinitiation of menstrual cycles.  PAST MEDICAL HISTORY:  Past Medical History:  Diagnosis Date   Carpal tunnel syndrome    Hypertension      PAST SURGICAL HISTORY:  History reviewed. No pertinent surgical history.  OB/GYN HISTORY:  OB History  Gravida Para Term Preterm AB Living  2 2          SAB IAB Ectopic Multiple Live Births               # Outcome Date GA Lbr Len/2nd Weight Sex Delivery Anes PTL Lv  2 Para           1 Para             No LMP recorded. Patient is postmenopausal.  Age at menarche: 11 Age at menopause: Unknown, likely in her late 20s Hx of HRT: Denies Hx of STDs: Denies Last pap: 10/2021 - NIML, HR HPV negative History of abnormal pap smears: Denies  SCREENING STUDIES:  Last mammogram: 10/2022  Last colonoscopy: 04/2021   MEDICATIONS: Outpatient Encounter Medications as of 01/09/2023  Medication Sig   atorvastatin (LIPITOR) 10 MG tablet TAKE 1 TABLET(10 MG) BY MOUTH DAILY   cetirizine (ZYRTEC) 10 MG tablet TAKE 1 TABLET(10 MG) BY MOUTH DAILY   hydrochlorothiazide (HYDRODIURIL) 25 MG tablet TAKE 1 TABLET(25 MG) BY MOUTH DAILY   lidocaine (LIDODERM) 5 % Place 1 patch onto the skin daily. Remove & Discard patch within 12 hours or as directed by MD   metFORMIN (GLUCOPHAGE-XR) 500 MG 24 hr tablet Take 1 tablet (500 mg total) by mouth daily with breakfast.   Vitamin D, Ergocalciferol, (DRISDOL) 1.25 MG (50000 UNIT) CAPS capsule Take 1 capsule (50,000 Units total) by mouth every 7 (seven) days.   [DISCONTINUED] cyclobenzaprine (FLEXERIL) 10 MG tablet Take 1 tablet (10 mg total) by mouth 2 (two) times daily as needed for muscle spasms. (Patient not taking: Reported on 09/30/2022)   [DISCONTINUED] fluticasone (FLONASE) 50 MCG/ACT nasal spray  Place 2 sprays into both nostrils daily. (Patient not taking: Reported on 09/30/2022)   [DISCONTINUED] Menthol, Topical Analgesic, (BIOFREEZE) 10 % LIQD Apply 1 application topically 2 (two) times daily as needed (for shoulder pain). (Patient not taking: Reported on 09/30/2022)   [DISCONTINUED] Menthol, Topical Analgesic, (MINERAL ICE) 2 % GEL Apply 1 application topically daily as needed (for shoulder pain). (Patient not taking: Reported on 09/30/2022)   [DISCONTINUED] naproxen (NAPROSYN) 500 MG tablet TAKE 1 TABLET BY MOUTH AS NEEDED UPTO 1 DOSE DAILY (Patient not taking: Reported on 09/30/2022)   [DISCONTINUED] Semaglutide,0.25 or 0.'5MG'$ /DOS, 2 MG/1.5ML SOPN Inject 0.25 mg into the skin once a week. 0.25 mg once weekly for 4 weeks then increase to 0.5 mg weekly for at least 4 weeks,max 1 mg   No facility-administered encounter medications on file as of 01/09/2023.    ALLERGIES:  No Known Allergies   FAMILY HISTORY:  Family History  Problem Relation Age of Onset   Heart disease Mother 59       MI   Cancer Sister 73       lung cancer   Stroke Maternal Grandmother    Heart disease Maternal Grandfather    Stroke Daughter  Diabetes Neg Hx    Colon cancer Neg Hx    Breast cancer Neg Hx    Ovarian cancer Neg Hx    Endometrial cancer Neg Hx    Pancreatic cancer Neg Hx    Prostate cancer Neg Hx      SOCIAL HISTORY:  Social Connections: Not on file    REVIEW OF SYSTEMS:  + Vision problems, bloating, numbness related to carpal tunnel Denies appetite changes, fevers, chills, fatigue, unexplained weight changes. Denies hearing loss, neck lumps or masses, mouth sores, ringing in ears or voice changes. Denies cough or wheezing.  Denies shortness of breath. Denies chest pain or palpitations. Denies leg swelling. Denies abdominal pain, blood in stools, constipation, diarrhea, nausea, vomiting, or early satiety. Denies pain with intercourse, dysuria, frequency, hematuria or  incontinence. Denies hot flashes, pelvic pain, vaginal bleeding or vaginal discharge.   Denies joint pain, back pain or muscle pain/cramps. Denies itching, rash, or wounds. Denies dizziness, headaches or seizures. Denies swollen lymph nodes or glands, denies easy bruising or bleeding. Denies anxiety, depression, confusion, or decreased concentration.  Physical Exam:  Vital Signs for this encounter:  Blood pressure (!) 143/80, pulse 83, temperature 98 F (36.7 C), temperature source Tympanic, resp. rate 14, weight 243 lb 9.6 oz (110.5 kg), SpO2 97 %. Body mass index is 38.15 kg/m. General: Alert, oriented, no acute distress.  HEENT: Normocephalic, atraumatic. Sclera anicteric.  Chest: Clear to auscultation bilaterally. No wheezes, rhonchi, or rales. Cardiovascular: Regular rate and rhythm, no murmurs, rubs, or gallops.  Abdomen: Obese. Normoactive bowel sounds. Soft, mildly distended, nontender to palpation. No masses or hepatosplenomegaly appreciated. No palpable fluid wave.  Extremities: Grossly normal range of motion. Warm, well perfused. No edema bilaterally.  Skin: No rashes or lesions.  Lymphatics: No cervical, supraclavicular, or inguinal adenopathy.  GU:  Normal external female genitalia. No lesions. No discharge or bleeding.             Bladder/urethra:  No lesions or masses, well supported bladder             Vagina: Mildly atrophic, no lesions noted.             Cervix: Normal appearing, no lesions.             Uterus: Uterus approximately 8-10 cm, mobile, exam somewhat limited by body habitus.  No parametrial involvement or nodularity.             Adnexa: No masses appreciated.  Rectal: Deferred  Endometrial biopsy procedure Preoperative diagnosis: Endometrium, fundal mass Postoperative diagnosis: Same as above Physician: Berline Lopes MD Estimated blood loss: Minimal Specimens: Endometrial biopsy Procedure: After the procedure was discussed with the patient including risks  and benefits, she gave verbal consent.  She was then placed in dorsolithotomy position and a speculum was placed in the vagina.  Once the cervix was well visualized it was cleansed with Betadine x3.  An endometrial Pipelle was then passed to a depth of 6 cm.  3 passes were performed with scant tissue, mostly mucus obtained.  Part of the tissue was placed in a specimen cup and the other portion in formalin.  Overall the patient tolerated the procedure well.  All instruments were removed from the vagina.  LABORATORY AND RADIOLOGIC DATA:  Outside medical records were reviewed to synthesize the above history, along with the history and physical obtained during the visit.   Lab Results  Component Value Date   WBC 8.8 05/04/2021   HGB 13.3 05/04/2021  HCT 43.0 05/04/2021   PLT 286 05/04/2021   GLUCOSE 83 09/30/2022   CHOL 133 09/30/2022   TRIG 74 09/30/2022   HDL 51 09/30/2022   LDLCALC 67 09/30/2022   ALT 12 09/30/2022   AST 20 09/30/2022   NA 142 09/30/2022   K 4.2 09/30/2022   CL 99 09/30/2022   CREATININE 0.64 09/30/2022   BUN 7 (L) 09/30/2022   CO2 28 09/30/2022   TSH 1.08 09/11/2016   HGBA1C 6.4 09/30/2022   MICROALBUR 1.65 07/19/2014

## 2023-01-07 NOTE — H&P (View-Only) (Signed)
GYNECOLOGIC ONCOLOGY NEW PATIENT CONSULTATION   Patient Name: Janet Woods  Patient Age: 67 y.o. Date of Service: 01/09/23 Referring Provider: Precious Gilding, DO [address] [place],  [place] [address]   Primary Care Provider: Precious Gilding, DO Consulting Provider: Jeral Pinch, MD   Assessment/Plan:  Postmenopausal patient with endometrial mass.  We discussed imaging findings and reviewed ultrasound pictures together.  I showed her how a postmenopausal endometrial lining typically looks.  In the setting of menopause, the appearance of her endometrium is very atypical.  We discussed possible etiologies for this endometrial mass including hyperplasia and malignancy.  Endometrial biopsy was performed today.  I was only able to advance the Pipelle to about 6 cm.  Tissue was collected for frozen section as well as some placed in formalin.  What was sent for frozen section unfortunately consisted only of mucus, no endometrial tissue.  I am concerned that biopsy sent in formalin today may be nondiagnostic.  We discussed plan to set her up for [facility], possible hysteroscopy next week and tentatively schedule her for hysterectomy in March or early April if pathology reveals hyperplasia or malignancy.  We reviewed the plan for a dilation and curettage, hysteroscopy, any other indicated procedures. The risks of surgery were discussed in detail and she understands these to include infection; injury to adjacent organs such as bowel, bladder, blood vessels, ureters and nerves; bleeding which may require blood transfusion; anesthesia risk; thromboembolic events; possible death; unforeseen complications; medical complications such as heart attack, stroke, pleural effusion and pneumonia. The patient will receive DVT and antibiotic prophylaxis as indicated. She voiced a clear understanding. She had the opportunity to ask questions. Perioperative instructions were reviewed with her. Prescriptions for post-op  medications were sent to her pharmacy of choice.  A copy of this note was sent to the patient's referring provider.   65 minutes of total time was spent for this patient encounter, including preparation, face-to-face counseling with the patient and coordination of care, and documentation of the encounter.  Jeral Pinch, MD  Division of Gynecologic Oncology  Department of Obstetrics and Gynecology  [facility]  ___________________________________________  Chief Complaint: Chief Complaint  Patient presents with   Uterine mass    History of Present Illness:  Janet Woods is a 67 y.o. y.o. female who is seen in consultation at the request of Precious Gilding, DO for an evaluation of an endometrial mass.  The patient saw her PCP in December. Given abdominal bloating, imaging was discussed. CT was ordered but not authorized by insurance. Pelvic ultrasound was ordered.  12/24/2022: Pelvic ultrasound shows complex heterogenous mass of the upper uterine segment endometrial canal measuring 3.3 cm, containing solid and cystic components.  Nonvisualization of the right ovary.  Unremarkable left ovary.  No pelvic free fluid.  The patient reports that after she retired in 2019, she gained some weight (approximately 25 pounds).  This was related to increased eating, eating fried foods, and drinking more soda.  She reports always having a "big" stomach, but more recently has developed some abdominal bloating.  She denies any abdominal or pelvic pain.  She denies vaginal bleeding or discharge.  She reports normal bowel function without the use of medications although notes a history of constipation previously requiring her to present to the emergency department.  She was treated with lactulose at that time.  She notes some urinary frequency since being on hydrochlorothiazide.  She endorses a good appetite without nausea or emesis.  She worked for years as a Psychologist, counselling.   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  Her GYN history is notable for starting Depo-Provera after the birth of her daughter at age 60.  She was on Depo for approximately 20-25 years.  When she stopped the Depo-Provera, she never had reinitiation of menstrual cycles.  PAST MEDICAL HISTORY:  Past Medical History:  Diagnosis Date   Carpal tunnel syndrome    Hypertension      PAST SURGICAL HISTORY:  History reviewed. No pertinent surgical history.  OB/GYN HISTORY:  OB History  Gravida Para Term Preterm AB Living  2 2          SAB IAB Ectopic Multiple Live Births               # Outcome Date GA Lbr Len/2nd Weight Sex Delivery Anes PTL Lv  2 Para           1 Para             No LMP recorded. Patient is postmenopausal.  Age at menarche: 72 Age at menopause: Unknown, likely in her late 46s Hx of HRT: Denies Hx of STDs: Denies Last pap: 10/2021 - NIML, HR HPV negative History of abnormal pap smears: Denies  SCREENING STUDIES:  Last mammogram: 10/2022  Last colonoscopy: 04/2021   MEDICATIONS: Outpatient Encounter Medications as of 01/09/2023  Medication Sig   atorvastatin (LIPITOR) 10 MG tablet TAKE 1 TABLET(10 MG) BY MOUTH DAILY   cetirizine (ZYRTEC) 10 MG tablet TAKE 1 TABLET(10 MG) BY MOUTH DAILY   hydrochlorothiazide (HYDRODIURIL) 25 MG tablet TAKE 1 TABLET(25 MG) BY MOUTH DAILY   lidocaine (LIDODERM) 5 % Place 1 patch onto the skin daily. Remove & Discard patch within 12 hours or as directed by MD   metFORMIN (GLUCOPHAGE-XR) 500 MG 24 hr tablet Take 1 tablet (500 mg total) by mouth daily with breakfast.   Vitamin D, Ergocalciferol, (DRISDOL) 1.25 MG (50000 UNIT) CAPS capsule Take 1 capsule (50,000 Units total) by mouth every 7 (seven) days.   [DISCONTINUED] cyclobenzaprine (FLEXERIL) 10 MG tablet Take 1 tablet (10 mg total) by mouth 2 (two) times daily as needed for muscle spasms. (Patient not taking: Reported on 09/30/2022)   [DISCONTINUED] fluticasone (FLONASE) 50 MCG/ACT nasal spray  Place 2 sprays into both nostrils daily. (Patient not taking: Reported on 09/30/2022)   [DISCONTINUED] Menthol, Topical Analgesic, (BIOFREEZE) 10 % LIQD Apply 1 application topically 2 (two) times daily as needed (for shoulder pain). (Patient not taking: Reported on 09/30/2022)   [DISCONTINUED] Menthol, Topical Analgesic, (MINERAL ICE) 2 % GEL Apply 1 application topically daily as needed (for shoulder pain). (Patient not taking: Reported on 09/30/2022)   [DISCONTINUED] naproxen (NAPROSYN) 500 MG tablet TAKE 1 TABLET BY MOUTH AS NEEDED UPTO 1 DOSE DAILY (Patient not taking: Reported on 09/30/2022)   [DISCONTINUED] Semaglutide,0.25 or 0.'5MG'$ /DOS, 2 MG/1.5ML SOPN Inject 0.25 mg into the skin once a week. 0.25 mg once weekly for 4 weeks then increase to 0.5 mg weekly for at least 4 weeks,max 1 mg   No facility-administered encounter medications on file as of 01/09/2023.    ALLERGIES:  No Known Allergies   FAMILY HISTORY:  Family History  Problem Relation Age of Onset   Heart disease Mother 40       MI   Cancer Sister 16       lung cancer   Stroke Maternal Grandmother    Heart disease Maternal Grandfather    Stroke Daughter  Diabetes Neg Hx    Colon cancer Neg Hx    Breast cancer Neg Hx    Ovarian cancer Neg Hx    Endometrial cancer Neg Hx    Pancreatic cancer Neg Hx    Prostate cancer Neg Hx      SOCIAL HISTORY:  Social Connections: Not on file    REVIEW OF SYSTEMS:  + Vision problems, bloating, numbness related to carpal tunnel Denies appetite changes, fevers, chills, fatigue, unexplained weight changes. Denies hearing loss, neck lumps or masses, mouth sores, ringing in ears or voice changes. Denies cough or wheezing.  Denies shortness of breath. Denies chest pain or palpitations. Denies leg swelling. Denies abdominal pain, blood in stools, constipation, diarrhea, nausea, vomiting, or early satiety. Denies pain with intercourse, dysuria, frequency, hematuria or  incontinence. Denies hot flashes, pelvic pain, vaginal bleeding or vaginal discharge.   Denies joint pain, back pain or muscle pain/cramps. Denies itching, rash, or wounds. Denies dizziness, headaches or seizures. Denies swollen lymph nodes or glands, denies easy bruising or bleeding. Denies anxiety, depression, confusion, or decreased concentration.  Physical Exam:  Vital Signs for this encounter:  Blood pressure (!) 143/80, pulse 83, temperature 98 F (36.7 C), temperature source Tympanic, resp. rate 14, weight 243 lb 9.6 oz (110.5 kg), SpO2 97 %. Body mass index is 38.15 kg/m. General: Alert, oriented, no acute distress.  HEENT: Normocephalic, atraumatic. Sclera anicteric.  Chest: Clear to auscultation bilaterally. No wheezes, rhonchi, or rales. Cardiovascular: Regular rate and rhythm, no murmurs, rubs, or gallops.  Abdomen: Obese. Normoactive bowel sounds. Soft, mildly distended, nontender to palpation. No masses or hepatosplenomegaly appreciated. No palpable fluid wave.  Extremities: Grossly normal range of motion. Warm, well perfused. No edema bilaterally.  Skin: No rashes or lesions.  Lymphatics: No cervical, supraclavicular, or inguinal adenopathy.  GU:  Normal external female genitalia. No lesions. No discharge or bleeding.             Bladder/urethra:  No lesions or masses, well supported bladder             Vagina: Mildly atrophic, no lesions noted.             Cervix: Normal appearing, no lesions.             Uterus: Uterus approximately 8-10 cm, mobile, exam somewhat limited by body habitus.  No parametrial involvement or nodularity.             Adnexa: No masses appreciated.  Rectal: Deferred  Endometrial biopsy procedure Preoperative diagnosis: Endometrium, fundal mass Postoperative diagnosis: Same as above Physician: Berline Lopes MD Estimated blood loss: Minimal Specimens: Endometrial biopsy Procedure: After the procedure was discussed with the patient including risks  and benefits, she gave verbal consent.  She was then placed in dorsolithotomy position and a speculum was placed in the vagina.  Once the cervix was well visualized it was cleansed with Betadine x3.  An endometrial Pipelle was then passed to a depth of 6 cm.  3 passes were performed with scant tissue, mostly mucus obtained.  Part of the tissue was placed in a specimen cup and the other portion in formalin.  Overall the patient tolerated the procedure well.  All instruments were removed from the vagina.  LABORATORY AND RADIOLOGIC DATA:  Outside medical records were reviewed to synthesize the above history, along with the history and physical obtained during the visit.   Lab Results  Component Value Date   WBC 8.8 05/04/2021   HGB 13.3 05/04/2021  HCT 43.0 05/04/2021   PLT 286 05/04/2021   GLUCOSE 83 09/30/2022   CHOL 133 09/30/2022   TRIG 74 09/30/2022   HDL 51 09/30/2022   LDLCALC 67 09/30/2022   ALT 12 09/30/2022   AST 20 09/30/2022   NA 142 09/30/2022   K 4.2 09/30/2022   CL 99 09/30/2022   CREATININE 0.64 09/30/2022   BUN 7 (L) 09/30/2022   CO2 28 09/30/2022   TSH 1.08 09/11/2016   HGBA1C 6.4 09/30/2022   MICROALBUR 1.65 07/19/2014

## 2023-01-09 ENCOUNTER — Other Ambulatory Visit: Payer: Self-pay

## 2023-01-09 ENCOUNTER — Inpatient Hospital Stay: Payer: 59 | Attending: Gynecologic Oncology | Admitting: Gynecologic Oncology

## 2023-01-09 ENCOUNTER — Inpatient Hospital Stay (HOSPITAL_BASED_OUTPATIENT_CLINIC_OR_DEPARTMENT_OTHER): Payer: 59 | Admitting: Gynecologic Oncology

## 2023-01-09 ENCOUNTER — Encounter: Payer: Self-pay | Admitting: Gynecologic Oncology

## 2023-01-09 VITALS — BP 143/80 | HR 83 | Temp 98.0°F | Resp 14 | Wt 243.6 lb

## 2023-01-09 DIAGNOSIS — N858 Other specified noninflammatory disorders of uterus: Secondary | ICD-10-CM

## 2023-01-09 DIAGNOSIS — Z780 Asymptomatic menopausal state: Secondary | ICD-10-CM | POA: Diagnosis not present

## 2023-01-09 DIAGNOSIS — Z79899 Other long term (current) drug therapy: Secondary | ICD-10-CM | POA: Insufficient documentation

## 2023-01-09 DIAGNOSIS — D390 Neoplasm of uncertain behavior of uterus: Secondary | ICD-10-CM | POA: Diagnosis not present

## 2023-01-09 DIAGNOSIS — I10 Essential (primary) hypertension: Secondary | ICD-10-CM | POA: Insufficient documentation

## 2023-01-09 DIAGNOSIS — E669 Obesity, unspecified: Secondary | ICD-10-CM

## 2023-01-09 DIAGNOSIS — Z801 Family history of malignant neoplasm of trachea, bronchus and lung: Secondary | ICD-10-CM | POA: Diagnosis not present

## 2023-01-09 DIAGNOSIS — R350 Frequency of micturition: Secondary | ICD-10-CM | POA: Insufficient documentation

## 2023-01-09 DIAGNOSIS — R140 Abdominal distension (gaseous): Secondary | ICD-10-CM | POA: Insufficient documentation

## 2023-01-09 NOTE — Patient Instructions (Signed)
Today you had an endometrial biopsy which is a sampling from the lining of the uterus. We will contact you with the results.   Preparing for your Surgery  Plan for surgery on January 14, 2023 with Dr. Jeral Pinch at [facility]. You will be scheduled for pelvic exam under anesthesia, dilation and curettage of the uterus with hysteroscopy and myosure.   Pre-operative Testing -You will receive a phone call from presurgical testing at [facility] to discuss surgery instructions and arrange for lab work if needed.  -Bring your insurance card, copy of an advanced directive if applicable, medication list.  -You should not be taking blood thinners or aspirin at least ten days prior to surgery unless instructed by your surgeon.  -Do not take supplements such as fish oil (omega 3), red yeast rice, turmeric before your surgery. You want to avoid medications with aspirin in them including headache powders such as BC or Goody's), Excedrin migraine.  Day Before Surgery at [place] will be advised you can have clear liquids up until 3 hours before your surgery.    Your role in recovery Your role is to become active as soon as directed by your doctor, while still giving yourself time to heal.  Rest when you feel tired. You will be asked to do the following in order to speed your recovery:  - Cough and breathe deeply. This helps to clear and expand your lungs and can prevent pneumonia after surgery.  - [place]. Do mild physical activity. Walking or moving your legs help your circulation and body functions return to normal. Do not try to get up or walk alone the first time after surgery.   -If you develop swelling on one leg or the other, pain in the back of your leg, redness/warmth in one of your legs, please call the office or go to the Emergency Room to have a doppler to rule out a blood clot. For shortness of breath, chest pain-seek care  in the Emergency Room as soon as possible. - Actively manage your pain. Managing your pain lets you move in comfort. We will ask you to rate your pain on a scale of zero to 10. It is your responsibility to tell your doctor or nurse where and how much you hurt so your pain can be treated.  Special Considerations -Your final pathology results from surgery should be available around one week after surgery and the results will be relayed to you when available.  -FMLA forms can be faxed to [phone] and please allow 5-7 business days for completion.  Pain Management After Surgery -Make sure that you have Tylenol and Ibuprofen at home IF [place] to use on a regular basis after surgery for pain control. We recommend alternating the medications every hour to six hours since they work differently and are processed in the body differently for pain relief.  -Review the attached handout on narcotic use and their risks and side effects.   Bowel Regimen -It is important to prevent constipation and drink adequate amounts of liquids.  Risks of Surgery Risks of surgery are low but include bleeding, infection, damage to surrounding structures, re-operation, blood clots, and very rarely death.  AFTER SURGERY INSTRUCTIONS  Return to work:  1-2 days if applicable  Activity: 1. Be up and out of the bed during the day.  Take a nap if needed.  You may walk up  steps but be careful and use the hand rail.  Stair climbing will tire you more than you think, you may need to stop part way and rest.   2. No lifting or straining for 1 week over 10 pounds. No pushing, pulling, straining for 1 week.  3. No driving for minimum 24 hours after surgery.  Do not drive if you are taking narcotic pain medicine and make sure that your reaction time has returned.   4. You can shower as soon as the next day after surgery. Shower daily. No tub baths or submerging your body in water until cleared by  your surgeon. If you have the soap that was given to you by pre-surgical testing that was used before surgery, you do not need to use it afterwards because this can irritate your incisions.   5. No sexual activity and nothing in the vagina for 2 weeks.  6. You may experience vaginal spotting and discharge after surgery.  The spotting is normal but if you experience heavy bleeding, call our office.  7. Take Tylenol or ibuprofen first for pain if you are able to take these medications and only use narcotic pain medication for severe pain not relieved by the Tylenol or Ibuprofen.  Monitor your Tylenol intake to a max of 4,000 mg in a 24 hour period. You can alternate these medications after surgery.  Diet: 1. Low sodium Heart Healthy Diet is recommended but you are cleared to resume your normal (before surgery) diet after your procedure.  2. It is safe to use a laxative, such as Miralax or Colace, if you have difficulty moving your bowels. You have been prescribed Sennakot at bedtime every evening to keep bowel movements regular and to prevent constipation.    Wound Care: 1. Keep clean and dry.  Shower daily.  Reasons to call the Doctor: Fever - Oral temperature greater than 100.4 degrees Fahrenheit Foul-smelling vaginal discharge Difficulty urinating Nausea and vomiting Increased pain at the site of the incision that is unrelieved with pain medicine. Difficulty breathing with or without chest pain New calf pain especially if only on one side Sudden, continuing increased vaginal bleeding with or without clots.   Contacts: For questions or concerns you should contact:  Dr. Jeral Pinch at [phone]  Joylene John, NP at [phone]  After Hours: call [phone] and have the GYN Oncologist paged/contacted (after 5 pm or on the weekends).  Messages sent via mychart are for non-urgent matters and are not responded to after hours so for urgent needs, please call the after hours  number.

## 2023-01-12 ENCOUNTER — Encounter (HOSPITAL_BASED_OUTPATIENT_CLINIC_OR_DEPARTMENT_OTHER): Payer: Self-pay | Admitting: Gynecologic Oncology

## 2023-01-12 LAB — SURGICAL PATHOLOGY

## 2023-01-12 NOTE — Progress Notes (Signed)
Called by pre op 01/12/2023. Daughter is + covid  and has been symptomatic since 2/19. Pt is neither + covid  nor symptomatic  surgery scheduled for 2/28 w Dr Berline Lopes at [facility].

## 2023-01-12 NOTE — Patient Instructions (Signed)
Today you had an endometrial biopsy which is a sampling from the lining of the uterus. We will contact you with the results.    Preparing for your Surgery   Plan for surgery on January 14, 2023 with Dr. Jeral Pinch at [facility]. You will be scheduled for pelvic exam under anesthesia, dilation and curettage of the uterus with hysteroscopy and myosure.    Pre-operative Testing -You will receive a phone call from presurgical testing at [facility] to discuss surgery instructions and arrange for lab work if needed.   -Bring your insurance card, copy of an advanced directive if applicable, medication list.   -You should not be taking blood thinners or aspirin at least ten days prior to surgery unless instructed by your surgeon.   -Do not take supplements such as fish oil (omega 3), red yeast rice, turmeric before your surgery. You want to avoid medications with aspirin in them including headache powders such as BC or Goody's), Excedrin migraine.   Day Before Surgery at [place] will be advised you can have clear liquids up until 3 hours before your surgery.     Your role in recovery Your role is to become active as soon as directed by your doctor, while still giving yourself time to heal.  Rest when you feel tired. You will be asked to do the following in order to speed your recovery:   - Cough and breathe deeply. This helps to clear and expand your lungs and can prevent pneumonia after surgery.  - [place]. Do mild physical activity. Walking or moving your legs help your circulation and body functions return to normal. Do not try to get up or walk alone the first time after surgery.   -If you develop swelling on one leg or the other, pain in the back of your leg, redness/warmth in one of your legs, please call the office or go to the Emergency Room to have a doppler to rule out a blood clot. For shortness of breath, chest  pain-seek care in the Emergency Room as soon as possible. - Actively manage your pain. Managing your pain lets you move in comfort. We will ask you to rate your pain on a scale of zero to 10. It is your responsibility to tell your doctor or nurse where and how much you hurt so your pain can be treated.   Special Considerations -Your final pathology results from surgery should be available around one week after surgery and the results will be relayed to you when available.   -FMLA forms can be faxed to [phone] and please allow 5-7 business days for completion.   Pain Management After Surgery -Make sure that you have Tylenol and Ibuprofen at home IF [place] to use on a regular basis after surgery for pain control. We recommend alternating the medications every hour to six hours since they work differently and are processed in the body differently for pain relief.   -Review the attached handout on narcotic use and their risks and side effects.    Bowel Regimen -It is important to prevent constipation and drink adequate amounts of liquids.   Risks of Surgery Risks of surgery are low but include bleeding, infection, damage to surrounding structures, re-operation, blood clots, and very rarely death.   AFTER SURGERY INSTRUCTIONS   Return to work:  1-2 days if applicable   Activity: 1. Be up and  out of the bed during the day.  Take a nap if needed.  You may walk up steps but be careful and use the hand rail.  Stair climbing will tire you more than you think, you may need to stop part way and rest.    2. No lifting or straining for 1 week over 10 pounds. No pushing, pulling, straining for 1 week.   3. No driving for minimum 24 hours after surgery.  Do not drive if you are taking narcotic pain medicine and make sure that your reaction time has returned.    4. You can shower as soon as the next day after surgery. Shower daily. No tub baths or submerging your  body in water until cleared by your surgeon. If you have the soap that was given to you by pre-surgical testing that was used before surgery, you do not need to use it afterwards because this can irritate your incisions.    5. No sexual activity and nothing in the vagina for 2 weeks.   6. You may experience vaginal spotting and discharge after surgery.  The spotting is normal but if you experience heavy bleeding, call our office.   7. Take Tylenol or ibuprofen first for pain if you are able to take these medications and only use narcotic pain medication for severe pain not relieved by the Tylenol or Ibuprofen.  Monitor your Tylenol intake to a max of 4,000 mg in a 24 hour period. You can alternate these medications after surgery.   Diet: 1. Low sodium Heart Healthy Diet is recommended but you are cleared to resume your normal (before surgery) diet after your procedure.   2. It is safe to use a laxative, such as Miralax or Colace, if you have difficulty moving your bowels. You have been prescribed Sennakot at bedtime every evening to keep bowel movements regular and to prevent constipation.     Wound Care: 1. Keep clean and dry.  Shower daily.   Reasons to call the Doctor: Fever - Oral temperature greater than 100.4 degrees Fahrenheit Foul-smelling vaginal discharge Difficulty urinating Nausea and vomiting Increased pain at the site of the incision that is unrelieved with pain medicine. Difficulty breathing with or without chest pain New calf pain especially if only on one side Sudden, continuing increased vaginal bleeding with or without clots.   Contacts: For questions or concerns you should contact:   Dr. Jeral Pinch at [phone]   Joylene John, NP at [phone]   After Hours: call [phone] and have the GYN Oncologist paged/contacted (after 5 pm or on the weekends).   Messages sent via mychart are for non-urgent matters and are not responded to after hours so for  urgent needs, please call the after hours number.

## 2023-01-12 NOTE — Progress Notes (Addendum)
Spoke w/ via phone for pre-op interview--- pt Lab needs dos----  [employer], ekg             Lab results------ no COVID test -----patient states asymptomatic  but stated her daughter was covid positive 01-05-2023 and pt has been asymptomatic since and not has not tested. Arrive at ------- [id number] on 01-14-2023 NPO after MN NO Solid Food.  Clear liquids from MN until--- 1245 Med rec completed Medications to take morning of surgery ----- none Diabetic medication ----- do not take metformin Patient instructed no nail polish to be worn day of surgery Patient instructed to bring photo id and insurance card day of surgery Patient aware to have Driver (ride ) / caregiver    for 24 hours after surgery -- sister- arlean Patient Special Instructions ----- n/a Pre-Op special Istructions ----- spoke w/ dr houser via phone he wrote progress note in epic. Sent inbox message in epic to dr Berline Lopes, orders requested Patient verbalized understanding of instructions that were given at this phone interview. Patient denies shortness of breath, chest pain, fever, cough at this phone interview.

## 2023-01-12 NOTE — Progress Notes (Signed)
Patient here for new patient consultation with Dr. Berline Lopes and for a pre-operative appointment prior to her scheduled surgery on 01/14/2023. She is scheduled for pelvic exam under anesthesia, dilation and curettage of the uterus with hysteroscopy and myosure. The surgery was discussed in detail.  See after visit summary for additional details. Visual aids used to discuss items related to surgery.   Discussed post-op pain management in detail including the aspects of the enhanced recovery pathway. We discussed the use of tylenol post-op and to monitor for a maximum of 4,000 mg in a 24 hour period. Discussed bowel regimen in detail.     Discussed the use of SCDs and measures to take at home to prevent DVT including frequent mobility.  Reportable signs and symptoms of DVT discussed. Post-operative instructions discussed and expectations for after surgery.    10 minutes spent with the patient and preparing information.  Verbalizing understanding of material discussed. No needs or concerns voiced at the end of the visit.  Advised patient to call for any needs.    This appointment is included in the global surgical bundle as pre-operative teaching and has no charge.

## 2023-01-13 ENCOUNTER — Telehealth: Payer: Self-pay | Admitting: Gynecologic Oncology

## 2023-01-13 ENCOUNTER — Telehealth: Payer: Self-pay

## 2023-01-13 NOTE — Telephone Encounter (Signed)
Pt returned call, she is aware of message from [facility]. Also Pre-op for procedure was done as well.    Telephone call to check on pre-operative status.  Patient compliant with pre-operative instructions.  Reinforced nothing to eat after midnight. Clear liquids until 12:45pm. Patient to arrive at 1:45pm.  No questions or concerns voiced.  Instructed to call for any needs.

## 2023-01-13 NOTE — Telephone Encounter (Signed)
Called patient to discuss biopsy results. Plan for d&c tomorrow.  Jeral Pinch MD Gynecologic Oncology

## 2023-01-13 NOTE — Telephone Encounter (Signed)
LVM for patient to call office regarding biopsy results.   Per Joylene John NP, Please let the patient know she will still need to have the procedure this Wed to get an adequate sample from the lining of the uterus. The biopsies taken in the office were limited and more tissue is needed

## 2023-01-14 ENCOUNTER — Encounter (HOSPITAL_BASED_OUTPATIENT_CLINIC_OR_DEPARTMENT_OTHER)
Admission: RE | Disposition: A | Discharge status: Home / Self Care | Payer: Self-pay | Source: Home / Self Care | Attending: Gynecologic Oncology

## 2023-01-14 ENCOUNTER — Other Ambulatory Visit: Payer: Self-pay

## 2023-01-14 ENCOUNTER — Ambulatory Visit (HOSPITAL_BASED_OUTPATIENT_CLINIC_OR_DEPARTMENT_OTHER)
Admission: RE | Admit: 2023-01-14 | Discharge: 2023-01-14 | Disposition: A | Discharge status: Home / Self Care | Payer: 59 | Attending: Gynecologic Oncology | Admitting: Gynecologic Oncology

## 2023-01-14 ENCOUNTER — Ambulatory Visit (HOSPITAL_BASED_OUTPATIENT_CLINIC_OR_DEPARTMENT_OTHER): Payer: 59 | Admitting: Anesthesiology

## 2023-01-14 ENCOUNTER — Encounter (HOSPITAL_BASED_OUTPATIENT_CLINIC_OR_DEPARTMENT_OTHER): Payer: Self-pay | Admitting: Gynecologic Oncology

## 2023-01-14 DIAGNOSIS — Z7984 Long term (current) use of oral hypoglycemic drugs: Secondary | ICD-10-CM | POA: Diagnosis not present

## 2023-01-14 DIAGNOSIS — N858 Other specified noninflammatory disorders of uterus: Secondary | ICD-10-CM

## 2023-01-14 DIAGNOSIS — E669 Obesity, unspecified: Secondary | ICD-10-CM | POA: Diagnosis not present

## 2023-01-14 DIAGNOSIS — N840 Polyp of corpus uteri: Secondary | ICD-10-CM | POA: Diagnosis not present

## 2023-01-14 DIAGNOSIS — I10 Essential (primary) hypertension: Secondary | ICD-10-CM | POA: Diagnosis not present

## 2023-01-14 DIAGNOSIS — E119 Type 2 diabetes mellitus without complications: Secondary | ICD-10-CM | POA: Insufficient documentation

## 2023-01-14 DIAGNOSIS — Z6838 Body mass index (BMI) 38.0-38.9, adult: Secondary | ICD-10-CM | POA: Insufficient documentation

## 2023-01-14 DIAGNOSIS — N859 Noninflammatory disorder of uterus, unspecified: Secondary | ICD-10-CM | POA: Diagnosis not present

## 2023-01-14 DIAGNOSIS — Z01818 Encounter for other preprocedural examination: Secondary | ICD-10-CM

## 2023-01-14 HISTORY — DX: Complete loss of teeth, unspecified cause, unspecified class: Z97.2

## 2023-01-14 HISTORY — DX: Unspecified osteoarthritis, unspecified site: M19.90

## 2023-01-14 HISTORY — DX: Other specified noninflammatory disorders of uterus: N85.8

## 2023-01-14 HISTORY — DX: Carpal tunnel syndrome, bilateral upper limbs: G56.03

## 2023-01-14 HISTORY — DX: Type 2 diabetes mellitus without complications: E11.9

## 2023-01-14 HISTORY — PX: DILATATION & CURETTAGE/HYSTEROSCOPY WITH MYOSURE: SHX6511

## 2023-01-14 HISTORY — DX: Complete loss of teeth, unspecified cause, unspecified class: K08.109

## 2023-01-14 LAB — POCT I-STAT, CHEM 8
BUN: 7 mg/dL — ABNORMAL LOW (ref 8–23)
Calcium, Ion: 1.19 mmol/L (ref 1.15–1.40)
Chloride: 102 mmol/L (ref 98–111)
Creatinine, Ser: 0.6 mg/dL (ref 0.44–1.00)
Glucose, Bld: 111 mg/dL — ABNORMAL HIGH (ref 70–99)
HCT: 46 % (ref 36.0–46.0)
Hemoglobin: 15.6 g/dL — ABNORMAL HIGH (ref 12.0–15.0)
Potassium: 3.4 mmol/L — ABNORMAL LOW (ref 3.5–5.1)
Sodium: 142 mmol/L (ref 135–145)
TCO2: 29 mmol/L (ref 22–32)

## 2023-01-14 LAB — GLUCOSE, CAPILLARY: Glucose-Capillary: 96 mg/dL (ref 70–99)

## 2023-01-14 SURGERY — DILATATION & CURETTAGE/HYSTEROSCOPY WITH MYOSURE
Anesthesia: General | Site: Vagina

## 2023-01-14 MED ORDER — FENTANYL CITRATE (PF) 100 MCG/2ML IJ SOLN
INTRAMUSCULAR | Status: AC
  Filled 2023-01-14: qty 2

## 2023-01-14 MED ORDER — DEXAMETHASONE SODIUM PHOSPHATE 10 MG/ML IJ SOLN: 4.0000 mg | INTRAMUSCULAR | Status: DC

## 2023-01-14 MED ORDER — ACETAMINOPHEN 325 MG PO TABS: 325.0000 mg | ORAL_TABLET | ORAL | Status: DC | PRN

## 2023-01-14 MED ORDER — SODIUM CHLORIDE 0.9 % IR SOLN
Status: DC | PRN
  Administered 2023-01-14: 3000 mL

## 2023-01-14 MED ORDER — ACETAMINOPHEN 160 MG/5ML PO SOLN: 325.0000 mg | ORAL | Status: DC | PRN

## 2023-01-14 MED ORDER — KETOROLAC TROMETHAMINE 15 MG/ML IJ SOLN: 15.0000 mg | Freq: Once | INTRAMUSCULAR | Status: DC

## 2023-01-14 MED ORDER — LIDOCAINE HCL (CARDIAC) PF 100 MG/5ML IV SOSY
PREFILLED_SYRINGE | INTRAVENOUS | Status: DC | PRN
  Administered 2023-01-14: 100 mg via INTRAVENOUS

## 2023-01-14 MED ORDER — PHENYLEPHRINE HCL (PRESSORS) 10 MG/ML IV SOLN
INTRAVENOUS | Status: DC | PRN
  Administered 2023-01-14: 160 ug via INTRAVENOUS

## 2023-01-14 MED ORDER — ACETAMINOPHEN 10 MG/ML IV SOLN: 1000.0000 mg | Freq: Once | INTRAVENOUS | Status: DC | PRN

## 2023-01-14 MED ORDER — MEPERIDINE HCL 25 MG/ML IJ SOLN: 6.2500 mg | INTRAMUSCULAR | Status: DC | PRN

## 2023-01-14 MED ORDER — FENTANYL CITRATE (PF) 100 MCG/2ML IJ SOLN
INTRAMUSCULAR | Status: DC | PRN
  Administered 2023-01-14 (×2): 50 ug via INTRAVENOUS

## 2023-01-14 MED ORDER — MIDAZOLAM HCL 2 MG/2ML IJ SOLN
INTRAMUSCULAR | Status: AC
  Filled 2023-01-14: qty 2

## 2023-01-14 MED ORDER — ACETAMINOPHEN 500 MG PO TABS
ORAL_TABLET | ORAL | Status: AC
  Filled 2023-01-14: qty 2

## 2023-01-14 MED ORDER — ACETAMINOPHEN 500 MG PO TABS
1000.0000 mg | ORAL_TABLET | ORAL | Status: AC
  Administered 2023-01-14: 1000 mg via ORAL

## 2023-01-14 MED ORDER — ONDANSETRON HCL 4 MG/2ML IJ SOLN: 4.0000 mg | Freq: Once | INTRAMUSCULAR | Status: DC | PRN

## 2023-01-14 MED ORDER — BUPIVACAINE HCL 0.25 % IJ SOLN
INTRAMUSCULAR | Status: DC | PRN
  Administered 2023-01-14: 10 mL

## 2023-01-14 MED ORDER — LACTATED RINGERS IV SOLN: INTRAVENOUS | Status: DC

## 2023-01-14 MED ORDER — OXYCODONE HCL 5 MG/5ML PO SOLN: 5.0000 mg | Freq: Once | ORAL | Status: DC | PRN

## 2023-01-14 MED ORDER — MIDAZOLAM HCL 2 MG/2ML IJ SOLN
INTRAMUSCULAR | Status: DC | PRN
  Administered 2023-01-14: 2 mg via INTRAVENOUS

## 2023-01-14 MED ORDER — ONDANSETRON HCL 4 MG/2ML IJ SOLN
INTRAMUSCULAR | Status: DC | PRN
  Administered 2023-01-14: 4 mg via INTRAVENOUS

## 2023-01-14 MED ORDER — FENTANYL CITRATE (PF) 100 MCG/2ML IJ SOLN: 25.0000 ug | INTRAMUSCULAR | Status: DC | PRN

## 2023-01-14 MED ORDER — PROPOFOL 10 MG/ML IV BOLUS
INTRAVENOUS | Status: DC | PRN
  Administered 2023-01-14: 200 mg via INTRAVENOUS

## 2023-01-14 MED ORDER — OXYCODONE HCL 5 MG PO TABS: 5.0000 mg | ORAL_TABLET | Freq: Once | ORAL | Status: DC | PRN

## 2023-01-14 SURGICAL SUPPLY — 16 items
DEVICE MYOSURE REACH (MISCELLANEOUS) IMPLANT
GLOVE BIO SURGEON STRL SZ 6 (GLOVE) ×2 IMPLANT
GLOVE BIO SURGEON STRL SZ 6.5 (GLOVE) IMPLANT
GLOVE BIOGEL PI IND STRL 7.0 (GLOVE) IMPLANT
GLOVE SURG SS PI 6.5 STRL IVOR (GLOVE) IMPLANT
GOWN STRL REUS W/ TWL LRG LVL3 (GOWN DISPOSABLE) IMPLANT
GOWN STRL REUS W/TWL LRG LVL3 (GOWN DISPOSABLE) ×4 IMPLANT
IV NS IRRIG 3000ML ARTHROMATIC (IV SOLUTION) ×2 IMPLANT
KIT TURNOVER CYSTO (KITS) ×2 IMPLANT
PACK VAGINAL WOMENS (CUSTOM PROCEDURE TRAY) ×2 IMPLANT
PAD OB MATERNITY 4.3X12.25 (PERSONAL CARE ITEMS) ×2 IMPLANT
PAD PREP 24X48 CUFFED NSTRL (MISCELLANEOUS) ×2 IMPLANT
SEAL ROD LENS SCOPE MYOSURE (ABLATOR) ×2 IMPLANT
SLEEVE SCD COMPRESS KNEE MED (STOCKING) ×2 IMPLANT
SOL PREP POV-IOD 4OZ 10% (MISCELLANEOUS) IMPLANT
TOWEL OR 17X24 6PK STRL BLUE (TOWEL DISPOSABLE) ×2 IMPLANT

## 2023-01-14 NOTE — Op Note (Signed)
OPERATIVE NOTE  PATIENT: Janet Woods DATE: 01/14/23  Preop Diagnosis: Uterine mass, EMB not diagnostic  Postoperative Diagnosis: same as above  Surgery: Hysteroscopy with dilation and endometrial sampling  Surgeons:  Valarie Cones, MD  Assistant: none  Anesthesia: General   Estimated blood loss: 25 ml  IVF:  see I&O flowsheet   Urine output: n/a   Complications: None apparent  Pathology: endometrial curetteings  Operative findings: Mobile 8-10 cm uterus, no parametrial involvement. Normal cervix. Atrophic endocervix. Endometrium with significant adhesions and polypoid tissue. Anterior uterine mass, possible fibroid. Tubal ostia not visualized. Pictures in media.  Procedure: The patient was identified in the preoperative holding area. Informed consent was signed on the chart. Patient was seen history was reviewed and exam was performed.   The patient was then taken to the operating room and placed in the supine position with SCD hose on. General anesthesia was then induced without difficulty. She was then placed in the dorsolithotomy position. The perineum was prepped with Betadine. The vagina was prepped with Betadine. The patient was then draped after the prep was dried.   Timeout was performed the patient, procedure, antibiotic, allergy, and length of procedure.   The speculum was placed in the vagina. The single tooth tenaculum was placed on the anterior lip of the cervix. A paracervical block was placed with 1% lidocaine. The uterine sound was placed in the cervix and advanced to the fundus. The cervix was successively dilated using pratts dilators to 23F. The Myosure was then inserted into the uterus under direct visualization with findings noted above. The Myosure reach was then used to excise endometrial adhesions and sample the endometrium.   The specimen was collected in the specimen sock and sent for permanent pathology.  The tenaculum was removed and  hemostasis was observed.   The vagina was irrigated.  All instrument, suture, laparotomy, Ray-Tec, and needle counts were correct x2. The patient tolerated the procedure well and was taken recovery room in stable condition.   Lafonda Mosses, MD

## 2023-01-14 NOTE — Anesthesia Procedure Notes (Signed)
Procedure Name: LMA Insertion Date/Time: 01/14/2023 11:34 AM  Performed by: Georgeanne Nim, CRNAPre-anesthesia Checklist: Patient identified, Emergency Drugs available, Suction available, Patient being monitored and Timeout performed Patient Re-evaluated:Patient Re-evaluated prior to induction Oxygen Delivery Method: Circle system utilized Preoxygenation: Pre-oxygenation with 100% oxygen Induction Type: IV induction Ventilation: Mask ventilation without difficulty LMA: LMA inserted LMA Size: 4.0 Number of attempts: 1 Placement Confirmation: positive ETCO2 and breath sounds checked- equal and bilateral Tube secured with: Tape Dental Injury: Teeth and Oropharynx as per pre-operative assessment

## 2023-01-14 NOTE — Interval H&P Note (Signed)
History and Physical Interval Note:  01/14/2023 7:51 AM  Janet Woods  has presented today for surgery, with the diagnosis of UTERINE MASS.  The various methods of treatment have been discussed with the patient and family. After consideration of risks, benefits and other options for treatment, the patient has consented to  Procedure(s): [place] (N/A) as a surgical intervention.  The patient's history has been reviewed, patient examined, no change in status, stable for surgery.  I have reviewed the patient's chart and labs.  Questions were answered to the patient's satisfaction.     Lafonda Mosses

## 2023-01-14 NOTE — Anesthesia Postprocedure Evaluation (Signed)
Anesthesia Post Note  Patient: Janet Woods  Procedure(s) Performed: DILATATION & CURETTAGE/HYSTEROSCOPY WITH MYOSURE (Vagina ) PELVIC EXAM UNDER ANESTHESIA (Vagina )     Patient location during evaluation: Phase II Anesthesia Type: General Level of consciousness: awake Pain management: pain level controlled Vital Signs Assessment: post-procedure vital signs reviewed and stable Respiratory status: spontaneous breathing Cardiovascular status: stable Postop Assessment: no apparent nausea or vomiting Anesthetic complications: no  No notable events documented.  Last Vitals:  Vitals:   01/14/23 1230 01/14/23 1233  BP: 118/66   Pulse: 73 72  Resp: (!) 22 (!) 22  Temp: 36.7 C   SpO2: (!) 89% 95%    Last Pain:  Vitals:   01/14/23 1207  TempSrc:   PainSc: 0-No pain                 Huston Foley

## 2023-01-14 NOTE — Anesthesia Preprocedure Evaluation (Signed)
Anesthesia Evaluation  Patient identified by MRN, date of birth, ID band Patient awake    Reviewed: Allergy & Precautions, NPO status , Patient's Chart, lab work & pertinent test results  Airway Mallampati: I       Dental  (+) Edentulous Upper, Edentulous Lower   Pulmonary neg pulmonary ROS   Pulmonary exam normal        Cardiovascular hypertension, Pt. on medications Normal cardiovascular exam     Neuro/Psych    GI/Hepatic Neg liver ROS,,,  Endo/Other  diabetes, Type 2, Oral Hypoglycemic Agents    Renal/GU negative Renal ROS  negative genitourinary   Musculoskeletal   Abdominal  (+) + obese  Peds  Hematology negative hematology ROS (+)   Anesthesia Other Findings   Reproductive/Obstetrics                              Anesthesia Physical Anesthesia Plan  ASA: 2  Anesthesia Plan: General   Post-op Pain Management:    Induction:   PONV Risk Score and Plan: Ondansetron, Dexamethasone and Midazolam  Airway Management Planned: LMA  Additional Equipment: None  Intra-op Plan:   Post-operative Plan: Extubation in OR  Informed Consent: I have reviewed the patients History and Physical, chart, labs and discussed the procedure including the risks, benefits and alternatives for the proposed anesthesia with the patient or authorized representative who has indicated his/her understanding and acceptance.     Dental advisory given  Plan Discussed with: CRNA  Anesthesia Plan Comments:          Anesthesia Quick Evaluation

## 2023-01-14 NOTE — Discharge Instructions (Addendum)
AFTER SURGERY INSTRUCTIONS   Return to work:  1-2 days if applicable   Activity: 1. Be up and out of the bed during the day.  Take a nap if needed.  You may walk up steps but be careful and use the hand rail.  Stair climbing will tire you more than you think, you may need to stop part way and rest.    2. No lifting or straining for 1 week over 10 pounds. No pushing, pulling, straining for 1 week.   3. No driving for minimum 24 hours after surgery.  Do not drive if you are taking narcotic pain medicine and make sure that your reaction time has returned.    4. You can shower as soon as the next day after surgery. Shower daily. No tub baths or submerging your body in water until cleared by your surgeon. If you have the soap that was given to you by pre-surgical testing that was used before surgery, you do not need to use it afterwards because this can irritate your incisions.    5. No sexual activity and nothing in the vagina for 2 weeks.   6. You may experience vaginal spotting and discharge after surgery.  The spotting is normal but if you experience heavy bleeding, call our office.   7. Take Tylenol or ibuprofen for pain if you are able to take these medications.  Monitor your Tylenol intake to a max of 4,000 mg in a 24 hour period.    Diet: 1. Low sodium Heart Healthy Diet is recommended but you are cleared to resume your normal (before surgery) diet after your procedure.   2. It is safe to use a laxative, such as Miralax or Colace, if you have difficulty moving your bowels. You have been prescribed Sennakot at bedtime every evening to keep bowel movements regular and to prevent constipation.     Wound Care: 1. Keep clean and dry.  Shower daily.   Reasons to call the Doctor: Fever - Oral temperature greater than 100.4 degrees Fahrenheit Foul-smelling vaginal discharge Difficulty urinating Nausea and vomiting Increased pain at the site of the incision that is unrelieved with pain  medicine. Difficulty breathing with or without chest pain New calf pain especially if only on one side Sudden, continuing increased vaginal bleeding with or without clots.   Contacts: For questions or concerns you should contact:   Dr. Jeral Pinch at [phone]   Joylene John, NP at [phone]   After Hours: call [phone] and have the GYN Oncologist paged/contacted (after 5 pm or on the weekends).   Messages sent via mychart are for non-urgent matters and are not responded to after hours so for urgent needs, please call the after hours number.Cervical Cerclage, Care After This sheet gives you information about how to care for yourself after your procedure. Your health care provider may also give you more specific instructions. If you have problems or questions, contact your health care provider. Post Anesthesia Home Care Instructions  Activity: Get plenty of rest for the remainder of the day. A responsible individual must stay with you for 24 hours following the procedure.  For the next 24 hours, DO NOT: -Drive a car -Paediatric nurse -Drink alcoholic beverages -Take any medication unless instructed by your physician -Make any legal decisions or sign important papers.  Meals: Start with liquid foods such as gelatin or soup. Progress to regular foods as tolerated. Avoid greasy, spicy, heavy foods. If nausea and/or vomiting occur, drink only clear  liquids until the nausea and/or vomiting subsides. Call your physician if vomiting continues.  Special Instructions/Symptoms: Your throat may feel dry or sore from the anesthesia or the breathing tube placed in your throat during surgery. If this causes discomfort, gargle with warm salt water. The discomfort should disappear within 24 hours.  No tylenol until 2:00 p.m.

## 2023-01-14 NOTE — Progress Notes (Signed)
Patient ambulated to bathroom.  She is doing well and ready for discharge.

## 2023-01-14 NOTE — Transfer of Care (Signed)
Immediate Anesthesia Transfer of Care Note  Patient: Janet Woods  Procedure(s) Performed: DILATATION & CURETTAGE/HYSTEROSCOPY WITH MYOSURE (Vagina ) PELVIC EXAM UNDER ANESTHESIA (Vagina )  Patient Location: PACU  Anesthesia Type:General  Level of Consciousness: awake, alert , oriented, and patient cooperative  Airway & Oxygen Therapy: Patient Spontanous Breathing  Post-op Assessment: Report given to RN and Post -op Vital signs reviewed and stable  Post vital signs: Reviewed and stable  Last Vitals:  Vitals Value Taken Time  BP 137/75 01/14/23 1207  Temp 36.7 C 01/14/23 1207  Pulse 80 01/14/23 1214  Resp 21 01/14/23 1214  SpO2 92 % 01/14/23 1214  Vitals shown include unvalidated device data.  Last Pain:  Vitals:   01/14/23 1207  TempSrc:   PainSc: 0-No pain      Patients Stated Pain Goal: 1 ([id number])  Complications: No notable events documented.

## 2023-01-15 ENCOUNTER — Encounter (HOSPITAL_BASED_OUTPATIENT_CLINIC_OR_DEPARTMENT_OTHER): Payer: Self-pay | Admitting: Gynecologic Oncology

## 2023-01-15 ENCOUNTER — Telehealth: Payer: Self-pay | Admitting: Gynecologic Oncology

## 2023-01-15 DIAGNOSIS — N858 Other specified noninflammatory disorders of uterus: Secondary | ICD-10-CM

## 2023-01-15 LAB — SURGICAL PATHOLOGY

## 2023-01-15 NOTE — Telephone Encounter (Signed)
Called the patient with biopsy results.  Discussed a benign polyp, disordered proliferative endometrium.  I am somewhat concerned given adhesions in her uterus that I may not have been able to sample the mass that was seen at the fundus.  This in combination with proliferative endometrium (with which we should not see antibody postmenopausal) worries me.  I offered two options moving forward.  The first would be to proceed with definitive surgery with a hysterectomy as we have scheduled in March.  The second would be to repeat an ultrasound in 1-2 weeks.  If fundal lesion is no longer seen and the patient would like to avoid hysterectomy, I think it would be reasonable to proceed with close surveillance.  I will place this order and asked my office to call the patient tomorrow to get the ultrasound scheduled.  Jeral Pinch MD Gynecologic Oncology

## 2023-01-16 ENCOUNTER — Telehealth: Payer: Self-pay | Admitting: *Deleted

## 2023-01-16 NOTE — Telephone Encounter (Signed)
Per Dr Berline Lopes patient scheduled for [place] on 3/7 at 11 am. Patient aware

## 2023-01-19 ENCOUNTER — Other Ambulatory Visit (HOSPITAL_COMMUNITY): Payer: Self-pay

## 2023-01-19 NOTE — Progress Notes (Signed)
Sent message, via epic in basket, requesting orders in epic from surgeon.  

## 2023-01-22 ENCOUNTER — Ambulatory Visit (HOSPITAL_COMMUNITY)
Admission: RE | Admit: 2023-01-22 | Discharge: 2023-01-22 | Disposition: A | Discharge status: Home / Self Care | Payer: 59 | Source: Ambulatory Visit | Attending: Gynecologic Oncology | Admitting: Gynecologic Oncology

## 2023-01-22 ENCOUNTER — Other Ambulatory Visit (HOSPITAL_COMMUNITY): Payer: Self-pay

## 2023-01-22 DIAGNOSIS — N858 Other specified noninflammatory disorders of uterus: Secondary | ICD-10-CM | POA: Diagnosis not present

## 2023-01-23 NOTE — Patient Instructions (Signed)
DUE TO COVID-19 ONLY TWO VISITORS  (aged 65 and older)  ARE ALLOWED TO COME WITH YOU AND STAY IN THE WAITING ROOM ONLY DURING PRE OP AND PROCEDURE.   **NO VISITORS ARE ALLOWED IN THE SHORT STAY AREA OR RECOVERY ROOM!!**  IF YOU WILL BE ADMITTED INTO THE HOSPITAL YOU ARE ALLOWED ONLY FOUR SUPPORT PEOPLE DURING VISITATION HOURS ONLY (7 AM -8PM)   The support person(s) must pass our screening, gel in and out, and wear a mask at all times, including in the patient's room. Patients must also wear a mask when staff or their support person are in the room. Visitors GUEST BADGE MUST BE WORN VISIBLY  One adult visitor may remain with you overnight and MUST be in the room by 8 P.M.     Your procedure is scheduled on: 02/04/23   Report to [facility] Main Entrance    Report to admitting at : 12:15 PM   Call this number if you have problems the morning of surgery [phone]  Eat a light diet the day before surgery.  Examples including soups, broths, toast, yogurt, mashed potatoes.  Things to avoid include carbonated beverages (fizzy beverages), raw fruits and raw vegetables, or beans.   If your bowels are filled with gas, your surgeon will have difficulty visualizing your pelvic organs which increases your surgical risks.   Do not eat food :After Midnight.   After Midnight you may have the following liquids until: 11:30 AM DAY OF SURGERY  Water Black Coffee (sugar ok, NO MILK/CREAM OR CREAMERS)  Tea (sugar ok, NO MILK/CREAM OR CREAMERS) regular and decaf                             Plain Jell-O (NO RED)                                           Fruit ices (not with fruit pulp, NO RED)                                     Popsicles (NO RED)                                                                  Juice: apple, WHITE grape, WHITE cranberry Sports drinks like Gatorade (NO RED)    Oral Hygiene is also important to reduce your risk of infection.                                     Remember - BRUSH YOUR TEETH THE MORNING OF SURGERY WITH YOUR REGULAR TOOTHPASTE  DENTURES WILL BE REMOVED PRIOR TO SURGERY PLEASE DO NOT APPLY "Poly grip" OR ADHESIVES!!!   Do NOT smoke after Midnight   Take these medicines the morning of surgery with A SIP OF WATER: cetirizine.  How to Manage Your Diabetes Before and After Surgery  Why is it important to control my blood sugar before and after  surgery? Improving blood sugar levels before and after surgery helps healing and can limit problems. A way of improving blood sugar control is eating a healthy diet by:  Eating less sugar and carbohydrates  Increasing activity/exercise  Talking with your doctor about reaching your blood sugar goals High blood sugars (greater than 180 mg/dL) can raise your risk of infections and slow your recovery, so you will need to focus on controlling your diabetes during the weeks before surgery. Make sure that the doctor who takes care of your diabetes knows about your planned surgery including the date and location.  How do I manage my blood sugar before surgery? Check your blood sugar at least 4 times a day, starting 2 days before surgery, to make sure that the level is not too high or low. Check your blood sugar the morning of your surgery when you wake up and every 2 hours until you get to the Short Stay unit. If your blood sugar is less than 70 mg/dL, you will need to treat for low blood sugar: Do not take insulin. Treat a low blood sugar (less than 70 mg/dL) with  cup of clear juice (cranberry or apple), 4 glucose tablets, OR glucose gel. Recheck blood sugar in 15 minutes after treatment (to make sure it is greater than 70 mg/dL). If your blood sugar is not greater than 70 mg/dL on recheck, call [phone] for further instructions. Report your blood sugar to the short stay nurse when you get to Short Stay.  If you are admitted to the hospital after surgery: Your blood sugar will be checked by the  staff and you will probably be given insulin after surgery (instead of oral diabetes medicines) to make sure you have good blood sugar levels. The goal for blood sugar control after surgery is 80-180 mg/dL.  WHAT DO I DO ABOUT MY DIABETES MEDICATION?  THE DAY BEFORE SURGERY, take metformin as usual.    THE MORNING OF SURGERY, DO NOT TAKE ANY ORAL DIABETIC MEDICATIONS DAY OF YOUR SURGERY  DO NOT TAKE THE FOLLOWING 7 DAYS PRIOR TO SURGERY: Ozempic, Wegovy, Rybelsus (Semaglutide), Byetta (exenatide), Bydureon (exenatide ER), Victoza, Saxenda (liraglutide), or Trulicity (dulaglutide) Mounjaro (Tirzepatide) Adlyxin (Lixisenatide), Polyethylene Glycol Loxenatide.                              You may not have any metal on your body including hair pins, jewelry, and body piercing             Do not wear make-up, lotions, powders, perfumes/cologne, or deodorant  Do not wear nail polish including gel and S&S, artificial/acrylic nails, or any other type of covering on natural nails including finger and toenails. If you have artificial nails, gel coating, etc. that needs to be removed by a nail salon please have this removed prior to surgery or surgery may need to be canceled/ delayed if the surgeon/ anesthesia feels like they are unable to be safely monitored.   Do not shave  48 hours prior to surgery.    Do not bring valuables to the hospital. [place].   Contacts, glasses, or bridgework may not be worn into surgery.   Bring small overnight bag day of surgery.   DO NOT [place]. PHARMACY WILL DISPENSE MEDICATIONS LISTED ON YOUR MEDICATION LIST  TO YOU DURING YOUR ADMISSION [place]!    Patients discharged on the day of surgery will not be allowed to drive home.  Someone NEEDS to stay with you for the first 24 hours after anesthesia.   Special Instructions: Bring a copy of your healthcare power of attorney  and living will documents         the day of surgery if you haven't scanned them before.              Please read over the following fact sheets you were given: IF YOU HAVE QUESTIONS ABOUT YOUR PRE-OP INSTRUCTIONS PLEASE CALL [phone]    [facility] - Preparing for Surgery Before surgery, you can play an important role.  Because skin is not sterile, your skin needs to be as free of germs as possible.  You can reduce the number of germs on your skin by washing with CHG (chlorahexidine gluconate) soap before surgery.  CHG is an antiseptic cleaner which kills germs and bonds with the skin to continue killing germs even after washing. Please DO NOT use if you have an allergy to CHG or antibacterial soaps.  If your skin becomes reddened/irritated stop using the CHG and inform your nurse when you arrive at Short Stay. Do not shave (including legs and underarms) for at least 48 hours prior to the first CHG shower.  You may shave your face/neck. Please follow these instructions carefully:  1.  Shower with CHG Soap the night before surgery and the  morning of Surgery.  2.  If you choose to wash your hair, wash your hair first as usual with your  normal  shampoo.  3.  After you shampoo, rinse your hair and body thoroughly to remove the  shampoo.                           4.  Use CHG as you would any other liquid soap.  You can apply chg directly  to the skin and wash                       Gently with a scrungie or clean washcloth.  5.  Apply the CHG Soap to your body ONLY FROM THE NECK DOWN.   Do not use on face/ open                           Wound or open sores. Avoid contact with eyes, ears mouth and genitals (private parts).                       Wash face,  Genitals (private parts) with your normal soap.             6.  Wash thoroughly, paying special attention to the area where your surgery  will be performed.  7.  Thoroughly rinse your body with warm water from the neck down.  8.  DO NOT  shower/wash with your normal soap after using and rinsing off  the CHG Soap.                9.  Pat yourself dry with a clean towel.            10.  Wear clean pajamas.            11.  Place clean sheets on your bed the night  of your first shower and do not  sleep with pets. Day of Surgery : Do not apply any lotions/deodorants the morning of surgery.  Please wear clean clothes to the hospital/surgery center.  FAILURE TO FOLLOW THESE INSTRUCTIONS MAY RESULT IN THE CANCELLATION OF YOUR SURGERY PATIENT SIGNATURE_________________________________  NURSE SIGNATURE__________________________________  ________________________________________________________________________ WHAT IS A BLOOD TRANSFUSION? Blood Transfusion Information  A transfusion is the replacement of blood or some of its parts. Blood is made up of multiple cells which provide different functions. Red blood cells carry oxygen and are used for blood loss replacement. White blood cells fight against infection. Platelets control bleeding. Plasma helps clot blood. Other blood products are available for specialized needs, such as hemophilia or other clotting disorders. BEFORE THE TRANSFUSION  Who gives blood for transfusions?  Healthy volunteers who are fully evaluated to make sure their blood is safe. This is blood bank blood. Transfusion therapy is the safest it has ever been in the practice of medicine. Before blood is taken from a donor, a complete history is taken to make sure that person has no history of diseases nor engages in risky social behavior (examples are intravenous drug use or sexual activity with multiple partners). The donor's travel history is screened to minimize risk of transmitting infections, such as malaria. The donated blood is tested for signs of infectious diseases, such as HIV and hepatitis. The blood is then tested to be sure it is compatible with you in order to minimize the chance of a transfusion reaction.  If you or a relative donates blood, this is often done in anticipation of surgery and is not appropriate for emergency situations. It takes many days to process the donated blood. RISKS AND COMPLICATIONS Although transfusion therapy is very safe and saves many lives, the main dangers of transfusion include:  Getting an infectious disease. Developing a transfusion reaction. This is an allergic reaction to something in the blood you were given. Every precaution is taken to prevent this. The decision to have a blood transfusion has been considered carefully by your caregiver before blood is given. Blood is not given unless the benefits outweigh the risks. AFTER THE TRANSFUSION Right after receiving a blood transfusion, you will usually feel much better and more energetic. This is especially true if your red blood cells have gotten low (anemic). The transfusion raises the level of the red blood cells which carry oxygen, and this usually causes an energy increase. The nurse administering the transfusion will monitor you carefully for complications. HOME CARE INSTRUCTIONS  No special instructions are needed after a transfusion. You may find your energy is better. Speak with your caregiver about any limitations on activity for underlying diseases you may have. SEEK MEDICAL CARE IF:  Your condition is not improving after your transfusion. You develop redness or irritation at the intravenous (IV) site. SEEK IMMEDIATE MEDICAL CARE IF:  Any of the following symptoms occur over the next 12 hours: Shaking chills. You have a temperature by mouth above 102 F (38.9 C), not controlled by medicine. Chest, back, or muscle pain. People around you feel you are not acting correctly or are confused. Shortness of breath or difficulty breathing. Dizziness and fainting. You get a rash or develop hives. You have a decrease in urine output. Your urine turns a dark color or changes to pink, red, or brown. Any of the  following symptoms occur over the next 10 days: You have a temperature by mouth above 102 F (38.9 C), not controlled by  medicine. Shortness of breath. Weakness after normal activity. The white part of the eye turns yellow (jaundice). You have a decrease in the amount of urine or are urinating less often. Your urine turns a dark color or changes to pink, red, or brown. Document Released: 10/31/2000 Document Revised: 01/26/2012 Document Reviewed: 06/19/2008 [facility] Patient Information 2014 [place], [place].  _______________________________________________________________________

## 2023-01-26 ENCOUNTER — Encounter (HOSPITAL_COMMUNITY): Payer: 59 | Attending: Student

## 2023-01-26 ENCOUNTER — Telehealth: Payer: Self-pay

## 2023-01-26 NOTE — Progress Notes (Signed)
Pt. Did not come to her PST appointment,as per pt. She needs to talk to Dr. Berline Lopes about the schedule surgery before they proceed.

## 2023-01-26 NOTE — Telephone Encounter (Signed)
Pt is aware of recent Ultrasound results.  She states Dr. Berline Lopes told her if the ultrasound was negative, she may not need the hysterectomy. She will wait to hear from Dr.Tucker on plan moving forward.

## 2023-01-26 NOTE — Telephone Encounter (Signed)
-----   Message from Dorothyann Gibbs, NP sent at 01/26/2023  8:19 AM EDT ----- Please let her know her [place] results have returned. The mass that was seen previously is not longer seen. Let her know Dr. Berline Lopes will review this when she returns and will let her know recommendations moving forward

## 2023-01-27 ENCOUNTER — Telehealth: Payer: Self-pay | Admitting: *Deleted

## 2023-01-27 ENCOUNTER — Telehealth: Payer: Self-pay | Admitting: Gynecologic Oncology

## 2023-01-27 DIAGNOSIS — N858 Other specified noninflammatory disorders of uterus: Secondary | ICD-10-CM

## 2023-01-27 NOTE — Telephone Encounter (Signed)
Per Dr Berline Lopes patient scheduled for an [place] on 6/24 at 10:30 am, patient aware

## 2023-01-27 NOTE — Telephone Encounter (Signed)
Called patient. Discussed ultrasound findings. Given resolution of masslike area previously seen and in the setting of no hyperplasia or malignancy on pathology, I think its reasonable to proceed with surveillance. I recommended repeat ultrasound in 3-4 months given changes that I suspect are related to recent surgery and given proliferative endometrium. Patient counseled on recommendation to call me with any new symptoms between now and follow-up ultrasound, including vaginal bleeding or pelvic pain.  Jeral Pinch MD Gynecologic Oncology

## 2023-01-29 ENCOUNTER — Inpatient Hospital Stay: Payer: 59

## 2023-01-29 ENCOUNTER — Inpatient Hospital Stay: Payer: 59 | Admitting: Gynecologic Oncology

## 2023-02-04 ENCOUNTER — Ambulatory Visit: Admit: 2023-02-04 | Payer: 59 | Admitting: Gynecologic Oncology

## 2023-02-04 DIAGNOSIS — E119 Type 2 diabetes mellitus without complications: Secondary | ICD-10-CM

## 2023-02-04 DIAGNOSIS — I10 Essential (primary) hypertension: Secondary | ICD-10-CM

## 2023-02-04 DIAGNOSIS — N858 Other specified noninflammatory disorders of uterus: Secondary | ICD-10-CM

## 2023-02-04 SURGERY — HYSTERECTOMY, TOTAL, ROBOT-ASSISTED, LAPAROSCOPIC, WITH BILATERAL SALPINGO-OOPHORECTOMY
Anesthesia: General

## 2023-04-20 ENCOUNTER — Ambulatory Visit (INDEPENDENT_AMBULATORY_CARE_PROVIDER_SITE_OTHER): Payer: Medicare HMO | Admitting: Student

## 2023-04-20 ENCOUNTER — Encounter: Payer: Self-pay | Admitting: Student

## 2023-04-20 VITALS — BP 132/74 | HR 75 | Ht 66.5 in | Wt 256.0 lb

## 2023-04-20 DIAGNOSIS — E119 Type 2 diabetes mellitus without complications: Secondary | ICD-10-CM | POA: Diagnosis not present

## 2023-04-20 DIAGNOSIS — H11423 Conjunctival edema, bilateral: Secondary | ICD-10-CM | POA: Diagnosis not present

## 2023-04-20 MED ORDER — OLOPATADINE HCL 0.2 % OP SOLN: 1.0000 [drp] | Freq: Every day | OPHTHALMIC | 0 refills | Status: DC

## 2023-04-20 NOTE — Progress Notes (Unsigned)
    SUBJECTIVE:   CHIEF COMPLAINT / HPI:   Eye irritation Inside of both eye lids have been irritation, itchy and red with a fluid filled lesions per pt. First,it was L eye, pt states she pressed on the lesion and the clear fluid came out but then about 2 weeks ago, the R eye became irritated with same symptoms. States she has been seeing floaters for several week only in R eye. No other vision changes.   She has been using Visine and applying warm compresses   PERTINENT  PMH / PSH: ***  OBJECTIVE:   BP 132/74   Pulse 75   Ht 5' 6.5" (1.689 m)   Wt 256 lb (116.1 kg)   SpO2 95%   BMI 40.70 kg/m  ***  General: NAD, pleasant, able to participate in exam Cardiac: RRR, no murmurs. Respiratory: CTAB, normal effort, No wheezes, rales or rhonchi Abdomen: Bowel sounds present, nontender, nondistended, no hepatosplenomegaly. Extremities: no edema or cyanosis. Skin: warm and dry, no rashes noted Neuro: alert, no obvious focal deficits Psych: Normal affect and mood  ASSESSMENT/PLAN:   No problem-specific Assessment & Plan notes found for this encounter.     Dr. Erick Alley, DO [place]    {    This will disappear when note is signed, click to select method of visit    :1}

## 2023-04-20 NOTE — Patient Instructions (Addendum)
It was great to see you! Thank you for allowing me to participate in your care!  I recommend that you always bring your medications to each appointment as this makes it easy to ensure you are on the correct medications and helps [place] not miss when refills are needed.  Our plans for today:  - I referred you to ophthalmology for diabetic eye exam, you will be contacted to schedule an appointment - I prescribed an allergy eye drop. Apply 1 drop to both eyes daily - If symptoms do not improve, return -Return in the next month to 2 for A1c and to discuss health maintenance   Take care and seek immediate care sooner if you develop any concerns.   Dr. Erick Alley, DO [facility]

## 2023-04-21 DIAGNOSIS — H11423 Conjunctival edema, bilateral: Secondary | ICD-10-CM | POA: Insufficient documentation

## 2023-04-21 NOTE — Assessment & Plan Note (Signed)
No drainage from eyes and lids not edematous, irritation is bilateral. Unlikely bacterial conjunctivitis, more likely related to allergies. Will trial pataday drops in each eye daily and pt can continue warm compresses. Pt to return if symptoms worsen/do not improve. Referral placed for ophthalmology d/t floaters in R eye and need for DM eye exam.

## 2023-05-11 ENCOUNTER — Ambulatory Visit (HOSPITAL_COMMUNITY)
Admission: RE | Admit: 2023-05-11 | Discharge: 2023-05-11 | Disposition: A | Discharge status: Home / Self Care | Payer: Medicare HMO | Source: Ambulatory Visit | Attending: Gynecologic Oncology | Admitting: Gynecologic Oncology

## 2023-05-11 DIAGNOSIS — N8500 Endometrial hyperplasia, unspecified: Secondary | ICD-10-CM | POA: Diagnosis not present

## 2023-05-11 DIAGNOSIS — N858 Other specified noninflammatory disorders of uterus: Secondary | ICD-10-CM | POA: Diagnosis not present

## 2023-05-14 ENCOUNTER — Telehealth: Payer: Self-pay | Admitting: Gynecologic Oncology

## 2023-05-14 NOTE — Telephone Encounter (Signed)
Called patient with ultrasound results. Given abnormal appearance of her uterus, I recommend consideration of definitive treatment (surgery to rule out underlying hyperplasia or malignancy that was not assessed on prior sampling). Patient is amenable to scheduling Trh/Bso. Will plan for this on 7/17, first case. I've asked office to call her with pre-op visit prior to that date.   Eugene Garnet MD Gynecologic Oncology

## 2023-05-19 DIAGNOSIS — E119 Type 2 diabetes mellitus without complications: Secondary | ICD-10-CM | POA: Diagnosis not present

## 2023-05-19 DIAGNOSIS — H524 Presbyopia: Secondary | ICD-10-CM | POA: Diagnosis not present

## 2023-05-20 ENCOUNTER — Other Ambulatory Visit: Payer: Self-pay | Admitting: Optometry

## 2023-05-20 ENCOUNTER — Telehealth: Payer: Self-pay | Admitting: *Deleted

## 2023-05-20 ENCOUNTER — Encounter: Payer: Self-pay | Admitting: Optometry

## 2023-05-20 DIAGNOSIS — H50811 Duane's syndrome, right eye: Secondary | ICD-10-CM

## 2023-05-20 DIAGNOSIS — H5589 Other irregular eye movements: Secondary | ICD-10-CM

## 2023-05-20 NOTE — Telephone Encounter (Signed)
Patient scheduled for a pre op appt with Dr Pricilla Holm and [facility] Mccreedy APP on 7/12

## 2023-05-22 NOTE — Patient Instructions (Signed)
SURGICAL WAITING ROOM VISITATION  Patients having surgery or a procedure may have no more than 2 support people in the waiting area - these visitors may rotate.    Children under the age of 24 must have an adult with them who is not the patient.  Due to an increase in RSV and influenza rates and associated hospitalizations, children ages 36 and under may not visit patients in [facility] hospitals.  If the patient needs to stay at the hospital during part of their recovery, the visitor guidelines for inpatient rooms apply. Pre-op nurse will coordinate an appropriate time for 1 support person to accompany patient in pre-op.  This support person may not rotate.    Please refer to the [facility] website for the visitor guidelines for Inpatients (after your surgery is over and you are in a regular room).       Your procedure is scheduled on: 06/03/23   Report to [facility] Main Entrance    Report to admitting at 6;15 AM   Call this number if you have problems the morning of surgery [phone]   Do not eat food :After Midnight. EAT A LIGHT DIET THE DAY BEFORE SURGERY AND AVOID FOODS THAT CAUSE GAS.   After Midnight you may have the following liquids until 5:30  AM DAY OF SURGERY  Water Non-Citrus Juices (without pulp, NO RED-Apple, White grape, White cranberry) Black Coffee (NO MILK/CREAM OR CREAMERS, sugar ok)  Clear Tea (NO MILK/CREAM OR CREAMERS, sugar ok) regular and decaf                             Plain Jell-O (NO RED)                                           Fruit ices (not with fruit pulp, NO RED)                                     Popsicles (NO RED)                                                               Sports drinks like Gatorade (NO RED)                   FOLLOW BOWEL PREP AND ANY ADDITIONAL PRE OP INSTRUCTIONS YOU RECEIVED FROM YOUR SURGEON'S OFFICE!!!     Oral Hygiene is also important to reduce your risk of infection.                                     Remember - BRUSH YOUR TEETH THE MORNING OF SURGERY WITH YOUR REGULAR TOOTHPASTE  DENTURES WILL BE REMOVED PRIOR TO SURGERY PLEASE DO NOT APPLY "Poly grip" OR ADHESIVES!!!   Do NOT smoke after Midnight   Take these medicines the morning of surgery Cetirizine if needed.   DO NOT TAKE ANY ORAL DIABETIC MEDICATIONS DAY OF YOUR SURGERY.  HOLD METFORMIN THE DAY  OF SURGERY             You may not have any metal on your body including hair pins, jewelry, and body piercing             Do not wear make-up, lotions, powders, perfumes or deodorant  Do not wear nail polish including gel and S&S, artificial/acrylic nails, or any other type of covering on natural nails including finger and toenails. If you have artificial nails, gel coating, etc. that needs to be removed by a nail salon please have this removed prior to surgery or surgery may need to be canceled/ delayed if the surgeon/ anesthesia feels like they are unable to be safely monitored.   Do not shave  48 hours prior to surgery.    Do not bring valuables to the hospital. [facility] IS NOT             RESPONSIBLE   FOR VALUABLES.   Contacts, glasses, dentures or bridgework may not be worn into surgery.  DO NOT BRING YOUR HOME MEDICATIONS TO THE HOSPITAL. PHARMACY WILL DISPENSE MEDICATIONS LISTED ON YOUR MEDICATION LIST TO YOU DURING YOUR ADMISSION IN THE HOSPITAL!    Patients discharged on the day of surgery will not be allowed to drive home.  Someone NEEDS to stay with you for the first 24 hours after anesthesia.   Special Instructions: Bring a copy of your healthcare power of attorney and living will documents the day of surgery if you haven't scanned them before.              Please read over the following fact sheets you were given: IF YOU HAVE QUESTIONS ABOUT YOUR PRE-OP INSTRUCTIONS PLEASE CALL [phone]   If you received a COVID test during your pre-op visit  it is requested that you wear a mask when out in public, stay  away from anyone that may not be feeling well and notify your surgeon if you develop symptoms. If you test positive for Covid or have been in contact with anyone that has tested positive in the last 10 days please notify you surgeon.    [facility] - Preparing for Surgery Before surgery, you can play an important role.  Because skin is not sterile, your skin needs to be as free of germs as possible.  You can reduce the number of germs on your skin by washing with CHG (chlorahexidine gluconate) soap before surgery.  CHG is an antiseptic cleaner which kills germs and bonds with the skin to continue killing germs even after washing. Please DO NOT use if you have an allergy to CHG or antibacterial soaps.  If your skin becomes reddened/irritated stop using the CHG and inform your nurse when you arrive at Short Stay. Do not shave (including legs and underarms) for at least 48 hours prior to the first CHG shower.  You may shave your face/neck.  Please follow these instructions carefully:  1.  Shower with CHG Soap the night before surgery and the  morning of surgery.  2.  If you choose to wash your hair, wash your hair first as usual with your normal  shampoo.  3.  After you shampoo, rinse your hair and body thoroughly to remove the shampoo.                             4.  Use CHG as you would any other liquid soap.  You can  apply chg directly to the skin and wash.  Gently with a scrungie or clean washcloth.  5.  Apply the CHG Soap to your body ONLY FROM THE NECK DOWN.   Do   not use on face/ open                           Wound or open sores. Avoid contact with eyes, ears mouth and   genitals (private parts).                       Wash face,  Genitals (private parts) with your normal soap.             6.  Wash thoroughly, paying special attention to the area where your    surgery  will be performed.  7.  Thoroughly rinse your body with warm water from the neck down.  8.  DO NOT shower/wash with your normal  soap after using and rinsing off the CHG Soap.                9.  Pat yourself dry with a clean towel.            10.  Wear clean pajamas.            11.  Place clean sheets on your bed the night of your first shower and do not  sleep with pets. Day of Surgery : Do not apply any lotions/deodorants the morning of surgery.  Please wear clean clothes to the hospital/surgery center.  FAILURE TO FOLLOW THESE INSTRUCTIONS MAY RESULT IN THE CANCELLATION OF YOUR SURGERY  PATIENT SIGNATURE_________________________________  NURSE SIGNATURE__________________________________  ________________________________________________________________________ WHAT IS A BLOOD TRANSFUSION? Blood Transfusion Information  A transfusion is the replacement of blood or some of its parts. Blood is made up of multiple cells which provide different functions. Red blood cells carry oxygen and are used for blood loss replacement. White blood cells fight against infection. Platelets control bleeding. Plasma helps clot blood. Other blood products are available for specialized needs, such as hemophilia or other clotting disorders. BEFORE THE TRANSFUSION  Who gives blood for transfusions?  Healthy volunteers who are fully evaluated to make sure their blood is safe. This is blood bank blood. Transfusion therapy is the safest it has ever been in the practice of medicine. Before blood is taken from a donor, a complete history is taken to make sure that person has no history of diseases nor engages in risky social behavior (examples are intravenous drug use or sexual activity with multiple partners). The donor's travel history is screened to minimize risk of transmitting infections, such as malaria. The donated blood is tested for signs of infectious diseases, such as HIV and hepatitis. The blood is then tested to be sure it is compatible with you in order to minimize the chance of a transfusion reaction. If you or a relative donates  blood, this is often done in anticipation of surgery and is not appropriate for emergency situations. It takes many days to process the donated blood. RISKS AND COMPLICATIONS Although transfusion therapy is very safe and saves many lives, the main dangers of transfusion include:  Getting an infectious disease. Developing a transfusion reaction. This is an allergic reaction to something in the blood you were given. Every precaution is taken to prevent this. The decision to have a blood transfusion has been considered carefully by your caregiver before blood is given.  Blood is not given unless the benefits outweigh the risks. AFTER THE TRANSFUSION Right after receiving a blood transfusion, you will usually feel much better and more energetic. This is especially true if your red blood cells have gotten low (anemic). The transfusion raises the level of the red blood cells which carry oxygen, and this usually causes an energy increase. The nurse administering the transfusion will monitor you carefully for complications. HOME CARE INSTRUCTIONS  No special instructions are needed after a transfusion. You may find your energy is better. Speak with your caregiver about any limitations on activity for underlying diseases you may have. SEEK MEDICAL CARE IF:  Your condition is not improving after your transfusion. You develop redness or irritation at the intravenous (IV) site. SEEK IMMEDIATE MEDICAL CARE IF:  Any of the following symptoms occur over the next 12 hours: Shaking chills. You have a temperature by mouth above 102 F (38.9 C), not controlled by medicine. Chest, back, or muscle pain. People around you feel you are not acting correctly or are confused. Shortness of breath or difficulty breathing. Dizziness and fainting. You get a rash or develop hives. You have a decrease in urine output. Your urine turns a dark color or changes to pink, red, or brown. Any of the following symptoms occur over  the next 10 days: You have a temperature by mouth above 102 F (38.9 C), not controlled by medicine. Shortness of breath. Weakness after normal activity. The white part of the eye turns yellow (jaundice). You have a decrease in the amount of urine or are urinating less often. Your urine turns a dark color or changes to pink, red, or brown. Document Released: 10/31/2000 Document Revised: 01/26/2012 Document Reviewed: 06/19/2008 [facility] Patient Information 2014 [place], [place].How to Manage Your Diabetes Before and After Surgery  Why is it important to control my blood sugar before and after surgery? Improving blood sugar levels before and after surgery helps healing and can limit problems. A way of improving blood sugar control is eating a healthy diet by:  Eating less sugar and carbohydrates  Increasing activity/exercise  Talking with your doctor about reaching your blood sugar goals High blood sugars (greater than 180 mg/dL) can raise your risk of infections and slow your recovery, so you will need to focus on controlling your diabetes during the weeks before surgery. Make sure that the doctor who takes care of your diabetes knows about your planned surgery including the date and location.  How do I manage my blood sugar before surgery? Check your blood sugar at least 4 times a day, starting 2 days before surgery, to make sure that the level is not too high or low. Check your blood sugar the morning of your surgery when you wake up and every 2 hours until you get to the Short Stay unit. If your blood sugar is less than 70 mg/dL, you will need to treat for low blood sugar: Do not take insulin. Treat a low blood sugar (less than 70 mg/dL) with  cup of clear juice (cranberry or apple), 4 glucose tablets, OR glucose gel. Recheck blood sugar in 15 minutes after treatment (to make sure it is greater than 70 mg/dL). If your blood sugar is not greater than 70 mg/dL on recheck, call [phone]  for further instructions. Report your blood sugar to the short stay nurse when you get to Short Stay.  If you are admitted to the hospital after surgery: Your blood sugar will be checked by the staff  and you will probably be given insulin after surgery (instead of oral diabetes medicines) to make sure you have good blood sugar levels. The goal for blood sugar control after surgery is 80-180 mg/dL.   WHAT DO I DO ABOUT MY DIABETES MEDICATION?  Do not take oral diabetes medicines (pills) the morning of surgery.  DO NOT TAKE THE FOLLOWING 7 DAYS PRIOR TO SURGERY: Ozempic, Wegovy, Rybelsus (Semaglutide), Byetta (exenatide), Bydureon (exenatide ER), Victoza, Saxenda (liraglutide), or Trulicity (dulaglutide) Mounjaro (Tirzepatide) Adlyxin (Lixisenatide), Polyethylene Glycol Loxenatide.  Patient Signature:  Date:   Nurse Signature:  Date:

## 2023-05-22 NOTE — Progress Notes (Signed)
COVID Vaccine received:  []  No [x]  Yes Date of any COVID positive Test in last 30 days:No  PCP - Erick Alley DO Cardiologist - no  Chest x-ray - 05/04/21 EPIC EKG -  05/25/23 Stress Test - no ECHO - 05/17/15 EPIC Cardiac Cath - no  Bowel Prep - [x]  No  []   Yes ______  Pacemaker / ICD device [x]  No []  Yes   Spinal Cord Stimulator:[x]  No []  Yes       History of Sleep Apnea? [x]  No []  Yes   CPAP used?- [x]  No []  Yes    Does the patient monitor blood sugar?          [x]  No []  Yes  []  N/A  Patient has: []  NO Hx DM   []  Pre-DM                 []  DM1  [x]   DM2 Does patient have a [employer] or Dexacom? []  No []  Yes   Fasting Blood Sugar Ranges-  Checks Blood Sugar __0__ times a day  GLP1 agonist / usual dose - No GLP1 instructions:  SGLT-2 inhibitors / usual dose - No SGLT-2 instructions:   Blood Thinner / Instructions:No Aspirin Instructions:No  Comments:   Activity level: Patient is able  to climb a flight of stairs without difficulty; [x]  No CP  [x]  No SOB, but would have ___   Patient can  perform ADLs without assistance.   Anesthesia review:   Patient denies shortness of breath, fever, cough and chest pain at PAT appointment.  Patient verbalized understanding and agreement to the Pre-Surgical Instructions that were given to them at this PAT appointment. Patient was also educated of the need to review these PAT instructions again prior to his/her surgery.I reviewed the appropriate phone numbers to call if they have any and questions or concerns.

## 2023-05-22 NOTE — Patient Instructions (Signed)
SURGICAL WAITING ROOM VISITATION  Patients having surgery or a procedure may have no more than 2 support people in the waiting area - these visitors may rotate.    Children under the age of 77 must have an adult with them who is not the patient.  Due to an increase in RSV and influenza rates and associated hospitalizations, children ages 45 and under may not visit patients in [facility] hospitals.  If the patient needs to stay at the hospital during part of their recovery, the visitor guidelines for inpatient rooms apply. Pre-op nurse will coordinate an appropriate time for 1 support person to accompany patient in pre-op.  This support person may not rotate.    Please refer to the [facility] website for the visitor guidelines for Inpatients (after your surgery is over and you are in a regular room).       Your procedure is scheduled on: 06/03/23   Report to [facility] Main Entrance    Report to admitting at 6:15AM   Call this number if you have problems the morning of surgery [phone]   Eat a light diet the day before surgery. Avoid foods that cause gas.   Do not eat food :After Midnight.   After Midnight you may have the following liquids until 5:30 AM DAY OF SURGERY  Water Non-Citrus Juices (without pulp, NO RED-Apple, White grape, White cranberry) Black Coffee (NO MILK/CREAM OR CREAMERS, sugar ok)  Clear Tea (NO MILK/CREAM OR CREAMERS, sugar ok) regular and decaf                             Plain Jell-O (NO RED)                                           Fruit ices (not with fruit pulp, NO RED)                                     Popsicles (NO RED)                                                               Sports drinks like Gatorade (NO RED)                FOLLOW BOWEL PREP AND ANY ADDITIONAL PRE OP INSTRUCTIONS YOU RECEIVED FROM YOUR SURGEON'S OFFICE!!!     Oral Hygiene is also important to reduce your risk of infection.                                     Remember - BRUSH YOUR TEETH THE MORNING OF SURGERY WITH YOUR REGULAR TOOTHPASTE  DENTURES WILL BE REMOVED PRIOR TO SURGERY PLEASE DO NOT APPLY "Poly grip" OR ADHESIVES!!!   Do NOT smoke after Midnight   Take these medicines the morning of surgery: cetirizine  DO NOT TAKE ANY ORAL DIABETIC MEDICATIONS DAY OF YOUR SURGERY. Hold Metformin the day of surgery  You may not have any metal on your body including hair pins, jewelry, and body piercing             Do not wear make-up, lotions, powders, perfumes or deodorant  Do not wear nail polish including gel and S&S, artificial/acrylic nails, or any other type of covering on natural nails including finger and toenails. If you have artificial nails, gel coating, etc. that needs to be removed by a nail salon please have this removed prior to surgery or surgery may need to be canceled/ delayed if the surgeon/ anesthesia feels like they are unable to be safely monitored.   Do not shave  48 hours prior to surgery.    Do not bring valuables to the hospital. [facility] IS NOT             RESPONSIBLE   FOR VALUABLES.   Contacts, glasses, dentures or bridgework may not be worn into surgery.  DO NOT BRING YOUR HOME MEDICATIONS TO THE HOSPITAL. PHARMACY WILL DISPENSE MEDICATIONS LISTED ON YOUR MEDICATION LIST TO YOU DURING YOUR ADMISSION IN THE HOSPITAL!    Patients discharged on the day of surgery will not be allowed to drive home.  Someone NEEDS to stay with you for the first 24 hours after anesthesia.   Special Instructions: Bring a copy of your healthcare power of attorney and living will documents the day of surgery if you haven't scanned them before.              Please read over the following fact sheets you were given: IF YOU HAVE QUESTIONS ABOUT YOUR PRE-OP INSTRUCTIONS PLEASE CALL [phone]   If you received a COVID test during your pre-op visit  it is requested that you wear a mask when out in public, stay away from anyone  that may not be feeling well and notify your surgeon if you develop symptoms. If you test positive for Covid or have been in contact with anyone that has tested positive in the last 10 days please notify you surgeon.    [facility] - Preparing for Surgery Before surgery, you can play an important role.  Because skin is not sterile, your skin needs to be as free of germs as possible.  You can reduce the number of germs on your skin by washing with CHG (chlorahexidine gluconate) soap before surgery.  CHG is an antiseptic cleaner which kills germs and bonds with the skin to continue killing germs even after washing. Please DO NOT use if you have an allergy to CHG or antibacterial soaps.  If your skin becomes reddened/irritated stop using the CHG and inform your nurse when you arrive at Short Stay. Do not shave (including legs and underarms) for at least 48 hours prior to the first CHG shower.  You may shave your face/neck.  Please follow these instructions carefully:  1.  Shower with CHG Soap the night before surgery and the  morning of surgery.  2.  If you choose to wash your hair, wash your hair first as usual with your normal  shampoo.  3.  After you shampoo, rinse your hair and body thoroughly to remove the shampoo.                             4.  Use CHG as you would any other liquid soap.  You can apply chg directly to the skin and wash.  Gently with a scrungie or  clean washcloth.  5.  Apply the CHG Soap to your body ONLY FROM THE NECK DOWN.   Do   not use on face/ open                           Wound or open sores. Avoid contact with eyes, ears mouth and   genitals (private parts).                       Wash face,  Genitals (private parts) with your normal soap.             6.  Wash thoroughly, paying special attention to the area where your    surgery  will be performed.  7.  Thoroughly rinse your body with warm water from the neck down.  8.  DO NOT shower/wash with your normal soap after using  and rinsing off the CHG Soap.                9.  Pat yourself dry with a clean towel.            10.  Wear clean pajamas.            11.  Place clean sheets on your bed the night of your first shower and do not  sleep with pets. Day of Surgery : Do not apply any lotions/deodorants the morning of surgery.  Please wear clean clothes to the hospital/surgery center.  FAILURE TO FOLLOW THESE INSTRUCTIONS MAY RESULT IN THE CANCELLATION OF YOUR SURGERY  How to Manage Your Diabetes Before and After Surgery  Why is it important to control my blood sugar before and after surgery? Improving blood sugar levels before and after surgery helps healing and can limit problems. A way of improving blood sugar control is eating a healthy diet by:  Eating less sugar and carbohydrates  Increasing activity/exercise  Talking with your doctor about reaching your blood sugar goals High blood sugars (greater than 180 mg/dL) can raise your risk of infections and slow your recovery, so you will need to focus on controlling your diabetes during the weeks before surgery. Make sure that the doctor who takes care of your diabetes knows about your planned surgery including the date and location.  How do I manage my blood sugar before surgery? Check your blood sugar at least 4 times a day, starting 2 days before surgery, to make sure that the level is not too high or low. Check your blood sugar the morning of your surgery when you wake up and every 2 hours until you get to the Short Stay unit. If your blood sugar is less than 70 mg/dL, you will need to treat for low blood sugar: Do not take insulin. Treat a low blood sugar (less than 70 mg/dL) with  cup of clear juice (cranberry or apple), 4 glucose tablets, OR glucose gel. Recheck blood sugar in 15 minutes after treatment (to make sure it is greater than 70 mg/dL). If your blood sugar is not greater than 70 mg/dL on recheck, call [phone] for further  instructions. Report your blood sugar to the short stay nurse when you get to Short Stay.  If you are admitted to the hospital after surgery: Your blood sugar will be checked by the staff and you will probably be given insulin after surgery (instead of oral diabetes medicines) to make sure you have good blood sugar levels. The goal  for blood sugar control after surgery is 80-180 mg/dL.   WHAT DO I DO ABOUT MY DIABETES MEDICATION?  Hold Metformin the day of surgery.  Do not take oral diabetes medicines (pills) the morning of surgery.  Reviewed and Endorsed by [facility] Patient Education Committee, August 2015 WHAT IS A BLOOD TRANSFUSION? Blood Transfusion Information  A transfusion is the replacement of blood or some of its parts. Blood is made up of multiple cells which provide different functions. Red blood cells carry oxygen and are used for blood loss replacement. White blood cells fight against infection. Platelets control bleeding. Plasma helps clot blood. Other blood products are available for specialized needs, such as hemophilia or other clotting disorders. BEFORE THE TRANSFUSION  Who gives blood for transfusions?  Healthy volunteers who are fully evaluated to make sure their blood is safe. This is blood bank blood. Transfusion therapy is the safest it has ever been in the practice of medicine. Before blood is taken from a donor, a complete history is taken to make sure that person has no history of diseases nor engages in risky social behavior (examples are intravenous drug use or sexual activity with multiple partners). The donor's travel history is screened to minimize risk of transmitting infections, such as malaria. The donated blood is tested for signs of infectious diseases, such as HIV and hepatitis. The blood is then tested to be sure it is compatible with you in order to minimize the chance of a transfusion reaction. If you or a relative donates blood, this is often done  in anticipation of surgery and is not appropriate for emergency situations. It takes many days to process the donated blood. RISKS AND COMPLICATIONS Although transfusion therapy is very safe and saves many lives, the main dangers of transfusion include:  Getting an infectious disease. Developing a transfusion reaction. This is an allergic reaction to something in the blood you were given. Every precaution is taken to prevent this. The decision to have a blood transfusion has been considered carefully by your caregiver before blood is given. Blood is not given unless the benefits outweigh the risks. AFTER THE TRANSFUSION Right after receiving a blood transfusion, you will usually feel much better and more energetic. This is especially true if your red blood cells have gotten low (anemic). The transfusion raises the level of the red blood cells which carry oxygen, and this usually causes an energy increase. The nurse administering the transfusion will monitor you carefully for complications. HOME CARE INSTRUCTIONS  No special instructions are needed after a transfusion. You may find your energy is better. Speak with your caregiver about any limitations on activity for underlying diseases you may have. SEEK MEDICAL CARE IF:  Your condition is not improving after your transfusion. You develop redness or irritation at the intravenous (IV) site. SEEK IMMEDIATE MEDICAL CARE IF:  Any of the following symptoms occur over the next 12 hours: Shaking chills. You have a temperature by mouth above 102 F (38.9 C), not controlled by medicine. Chest, back, or muscle pain. People around you feel you are not acting correctly or are confused. Shortness of breath or difficulty breathing. Dizziness and fainting. You get a rash or develop hives. You have a decrease in urine output. Your urine turns a dark color or changes to pink, red, or brown. Any of the following symptoms occur over the next 10 days: You  have a temperature by mouth above 102 F (38.9 C), not controlled by medicine. Shortness of breath.  Weakness after normal activity. The white part of the eye turns yellow (jaundice). You have a decrease in the amount of urine or are urinating less often. Your urine turns a dark color or changes to pink, red, or brown. Document Released: 10/31/2000 Document Revised: 01/26/2012 Document Reviewed: 06/19/2008 [facility] Patient Information 2014 [place], [place].

## 2023-05-25 ENCOUNTER — Encounter (HOSPITAL_COMMUNITY): Payer: Self-pay

## 2023-05-25 ENCOUNTER — Other Ambulatory Visit: Payer: Self-pay

## 2023-05-25 ENCOUNTER — Encounter (HOSPITAL_COMMUNITY)
Admission: RE | Admit: 2023-05-25 | Discharge: 2023-05-25 | Disposition: A | Discharge status: Home / Self Care | Payer: Medicare HMO | Source: Ambulatory Visit | Attending: Gynecologic Oncology | Admitting: Gynecologic Oncology

## 2023-05-25 VITALS — BP 156/100 | HR 76 | Temp 98.0°F | Resp 18 | Ht 67.0 in | Wt 252.0 lb

## 2023-05-25 DIAGNOSIS — Z01818 Encounter for other preprocedural examination: Secondary | ICD-10-CM | POA: Diagnosis not present

## 2023-05-25 DIAGNOSIS — E119 Type 2 diabetes mellitus without complications: Secondary | ICD-10-CM | POA: Insufficient documentation

## 2023-05-25 DIAGNOSIS — N858 Other specified noninflammatory disorders of uterus: Secondary | ICD-10-CM | POA: Diagnosis not present

## 2023-05-25 LAB — COMPREHENSIVE METABOLIC PANEL
ALT: 19 U/L (ref 0–44)
AST: 20 U/L (ref 15–41)
Albumin: 3.5 g/dL (ref 3.5–5.0)
Alkaline Phosphatase: 57 U/L (ref 38–126)
Anion gap: 7 (ref 5–15)
BUN: 10 mg/dL (ref 8–23)
CO2: 29 mmol/L (ref 22–32)
Calcium: 8.6 mg/dL — ABNORMAL LOW (ref 8.9–10.3)
Chloride: 102 mmol/L (ref 98–111)
Creatinine, Ser: 0.75 mg/dL (ref 0.44–1.00)
GFR, Estimated: >60 mL/min (ref >60)
Glucose, Bld: 109 mg/dL — ABNORMAL HIGH (ref 70–99)
Potassium: 3.8 mmol/L (ref 3.5–5.1)
Sodium: 138 mmol/L (ref 135–145)
Total Bilirubin: 0.3 mg/dL (ref 0.3–1.2)
Total Protein: 8 g/dL (ref 6.5–8.1)

## 2023-05-25 LAB — CBC
HCT: 40.8 % (ref 36.0–46.0)
Hemoglobin: 12.8 g/dL (ref 12.0–15.0)
MCH: 28.1 pg (ref 26.0–34.0)
MCHC: 31.4 g/dL (ref 30.0–36.0)
MCV: 89.5 fL (ref 80.0–100.0)
Platelets: 273 10*3/uL (ref 150–400)
RBC: 4.56 MIL/uL (ref 3.87–5.11)
RDW: 14.4 % (ref 11.5–15.5)
WBC: 6.8 10*3/uL (ref 4.0–10.5)
nRBC: 0 % (ref 0.0–0.2)

## 2023-05-25 LAB — GLUCOSE, CAPILLARY: Glucose-Capillary: 107 mg/dL — ABNORMAL HIGH (ref 70–99)

## 2023-05-26 LAB — HEMOGLOBIN A1C
Hgb A1c MFr Bld: 7 % — ABNORMAL HIGH (ref 4.8–5.6)
Mean Plasma Glucose: 154 mg/dL

## 2023-05-27 NOTE — H&P (View-Only) (Signed)
Gynecologic Oncology Return Clinic Visit  05/29/23  Reason for Visit: treatment planning  Treatment History: The patient saw her PCP in December. Given abdominal bloating, imaging was discussed. CT was ordered but not authorized by insurance. Pelvic ultrasound was ordered.   12/24/2022: Pelvic ultrasound shows complex heterogenous mass of the upper uterine segment endometrial canal measuring 3.3 cm, containing solid and cystic components.  Nonvisualization of the right ovary.  Unremarkable left ovary.  No pelvic free fluid.   The patient reports that after she retired in 2019, she gained some weight (approximately 25 pounds).  This was related to increased eating, eating fried foods, and drinking more soda.  She reports always having a "big" stomach, but more recently has developed some abdominal bloating.   She denies any abdominal or pelvic pain.  She denies vaginal bleeding or discharge.  She reports normal bowel function without the use of medications although notes a history of constipation previously requiring her to present to the emergency department.  She was treated with lactulose at that time.  She notes some urinary frequency since being on hydrochlorothiazide.  She endorses a good appetite without nausea or emesis.   She worked for years as a Agricultural engineer.  Her daughter is a Engineer, civil (consulting).   Her GYN history is notable for starting Depo-Provera after the birth of her daughter at age 81.  She was on Depo for approximately 20-25 years.  When she stopped the Depo-Provera, she never had reinitiation of menstrual cycles.  01/14/23: Hysteroscopy with endometrial sampling. Operative findings: Mobile 8-10 cm uterus, no parametrial involvement. Normal cervix. Atrophic endocervix. Endometrium with significant adhesions and polypoid tissue. Anterior uterine mass, possible fibroid. Tubal ostia not visualized. Pictures in media.  Pathology: Very scant atrophic endometrial epithelium. Muco-inflammatory  and squamous debris with admixed benign endocervical epithelium and endocervix showing chronic endocervicitis..  We discussed follow-up given scant tissue with either definitive and diagnostic hysterectomy versus follow-up pelvic ultrasound.  The patient opted for a pelvic ultrasound.  Pelvic ultrasound in early March showed small cystic spaces measuring up to 4 mm within the central uterus, no discrete mass.  The masslike appearance of the endometrium seen on prior ultrasound is no longer seen.  Follow-up repeat imaging recommended in 3-6 months.  Pelvic ultrasound on 05/10/2025 revealed an endometrium measuring 21.2 mm, abnormal in appearance with complex echotexture and multiple cystic spaces.  Transitional zone poorly defined.  Interval History: Doing well.  Denies any vaginal bleeding or discharge.  Denies pelvic or abdominal pain.  Has constipation at baseline, unchanged.  Uses laxatives and castor oil as needed.  Past Medical/Surgical History: Past Medical History:  Diagnosis Date   Arthritis    Carpal tunnel syndrome on both sides    Full dentures    Hypertension    Type 2 diabetes mellitus (HCC)    followed by pcp    (01-12-2023 per pt pcp note in epic DM2 ,however , per pt does have diabetes anymore just takes metformen to lose weight)   Uterine mass     Past Surgical History:  Procedure Laterality Date   COLONOSCOPY WITH PROPOFOL  04/22/2021   dr Jolinda Croak   DILATATION & CURETTAGE/HYSTEROSCOPY WITH MYOSURE N/A 01/14/2023   Procedure: DILATATION & CURETTAGE/HYSTEROSCOPY WITH MYOSURE;  Surgeon: Carver Fila, MD;  Location[facility];  Service: Gynecology;  Laterality: N/A;   MULTIPLE TOOTH EXTRACTIONS     in [place]    Family History  Problem Relation Age of Onset  Heart disease Mother 66       MI   Cancer Sister 65       lung cancer   Stroke Maternal Grandmother    Heart disease Maternal Grandfather    Stroke Daughter    Diabetes Neg Hx     Colon cancer Neg Hx    Breast cancer Neg Hx    Ovarian cancer Neg Hx    Endometrial cancer Neg Hx    Pancreatic cancer Neg Hx    Prostate cancer Neg Hx     Social History   Socioeconomic History   Marital status: Single    Spouse name: Not on file   Number of children: Not on file   Years of education: Not on file   Highest education level: Not on file  Occupational History   Occupation: retired  Tobacco Use   Smoking status: Never   Smokeless tobacco: Never  Vaping Use   Vaping status: Never Used  Substance and Sexual Activity   Alcohol use: No   Drug use: Never   Sexual activity: Not on file  Other Topics Concern   Not on file  Social History Narrative   Lives with daughter with CP [name] age 65).  No long term relationship.  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   Social Determinants of Health   Financial Resource Strain: Not on file  Food Insecurity: Not on file  Transportation Needs: Not on file  Physical Activity: Not on file  Stress: Not on file  Social Connections: Not on file    Current Medications:  Current Outpatient Medications:    senna-docusate (SENOKOT-S) 8.6-50 MG tablet, Take 2 tablets by mouth at bedtime. For AFTER surgery, do not take if having diarrhea, Disp: 30 tablet, Rfl: 0   traMADol (ULTRAM) 50 MG tablet, Take 1 tablet (50 mg total) by mouth every 6 (six) hours as needed for severe pain. For AFTER surgery only, do not take and drive, Disp: 10 tablet, Rfl: 0   cetirizine (ZYRTEC) 10 MG tablet, TAKE 1 TABLET(10 MG) BY MOUTH DAILY (Patient taking differently: Take 10 mg by mouth daily as needed for allergies.), Disp: 30 tablet, Rfl: 2   fluticasone (FLONASE) 50 MCG/ACT nasal spray, Place 1 spray into both nostrils daily as needed for allergies or rhinitis., Disp: , Rfl:    hydrochlorothiazide (HYDRODIURIL) 25 MG tablet, TAKE 1 TABLET(25 MG) BY MOUTH DAILY, Disp: 90 tablet, Rfl: 1   lidocaine (LIDODERM) 5 %, Place 1 patch onto the skin  daily. Remove & Discard patch within 12 hours or as directed by MD (Patient taking differently: Place 1 patch onto the skin daily as needed (back/shoulder pain). Remove & Discard patch within 12 hours or as directed by MD), Disp: 30 patch, Rfl: 0   MAXITROL 3.5-10000-0.1 OINT, Place 1 Application into both eyes 2 (two) times daily., Disp: , Rfl:    metFORMIN (GLUCOPHAGE-XR) 500 MG 24 hr tablet, Take 1 tablet (500 mg total) by mouth daily with breakfast. (Patient taking differently: Take 500 mg by mouth daily as needed (weight loss).), Disp: 90 tablet, Rfl: 1   naproxen (NAPROSYN) 500 MG tablet, Take 500 mg by mouth 2 (two) times daily as needed for moderate pain., Disp: , Rfl:    Olopatadine HCl 0.2 % SOLN, Apply 1 drop to eye daily. (Patient not taking: Reported on 05/19/2023), Disp: 2.5 mL, Rfl: 0   Polyethyl Glycol-Propyl Glycol (LUBRICATING EYE DROPS OP), Place 1 drop into both eyes daily  as needed (dry eyes)., Disp: , Rfl:    Vitamin D, Ergocalciferol, (DRISDOL) 1.25 MG (50000 UNIT) CAPS capsule, Take 1 capsule (50,000 Units total) by mouth every 7 (seven) days. (Patient not taking: Reported on 05/19/2023), Disp: 12 capsule, Rfl: 1  Review of Systems: Denies appetite changes, fevers, chills, fatigue, unexplained weight changes. Denies hearing loss, neck lumps or masses, mouth sores, ringing in ears or voice changes. Denies cough or wheezing.  Denies shortness of breath. Denies chest pain or palpitations. Denies leg swelling. Denies abdominal distention, pain, blood in stools, constipation, diarrhea, nausea, vomiting, or early satiety. Denies pain with intercourse, dysuria, frequency, hematuria or incontinence. Denies hot flashes, pelvic pain, vaginal bleeding or vaginal discharge.   Denies joint pain, back pain or muscle pain/cramps. Denies itching, rash, or wounds. Denies dizziness, headaches, numbness or seizures. Denies swollen lymph nodes or glands, denies easy bruising or bleeding. Denies  anxiety, depression, confusion, or decreased concentration.  Physical Exam: BP 137/70 (BP Location: Left Arm)   Pulse 80   Temp 98.2 F (36.8 C) (Oral)   Resp 16   Wt 253 lb (114.8 kg)   SpO2 96% Comment: RA  BMI 39.63 kg/m  General: Alert, oriented, no acute distress. HEENT: Normocephalic, atraumatic, sclera anicteric. Chest: Unlabored breathing on room air.  Lungs clear to auscultation bilaterally.  No wheezes or rhonchi. Cardiovascular: Regular rate and rhythm, no murmurs. Abdomen: Obese, soft, nontender.  Normoactive bowel sounds.  No masses or hepatosplenomegaly appreciated.   Extremities: Grossly normal range of motion.  Warm, well perfused.  No edema bilaterally.  Laboratory & Radiologic Studies: Hgb A1c 7.0%     Latest Ref Rng & Units 05/25/2023    8:30 AM 01/14/2023    8:08 AM 05/04/2021    4:49 PM  CBC  WBC 4.0 - 10.5 K/uL 6.8   8.8   Hemoglobin 12.0 - 15.0 g/dL 86.5  78.4  69.6   Hematocrit 36.0 - 46.0 % 40.8  46.0  43.0   Platelets 150 - 400 K/uL 273   286       Latest Ref Rng & Units 05/25/2023    8:30 AM 01/14/2023    8:08 AM 09/30/2022    9:43 AM  CMP  Glucose 70 - 99 mg/dL 295  284  83   BUN 8 - 23 mg/dL 10  7  7    Creatinine 0.44 - 1.00 mg/dL 1.32  4.40  1.02   Sodium 135 - 145 mmol/L 138  142  142   Potassium 3.5 - 5.1 mmol/L 3.8  3.4  4.2   Chloride 98 - 111 mmol/L 102  102  99   CO2 22 - 32 mmol/L 29   28   Calcium 8.9 - 10.3 mg/dL 8.6   9.4   Total Protein 6.5 - 8.1 g/dL 8.0   8.0   Total Bilirubin 0.3 - 1.2 mg/dL 0.3   0.3   Alkaline Phos 38 - 126 U/L 57   68   AST 15 - 41 U/L 20   20   ALT 0 - 44 U/L 19   12    Assessment & Plan: Janet Woods is a 67 y.o. woman with recurrence of thickened and abnormal appearing endometrial complex despite previous sampling negative for hyperplasia or malignancy.  The patient continues to do very well and is asymptomatic.  We discussed again imaging findings that we reviewed by phone recently.  Given  redevelopment of markedly thickened and abnormal appearing endometrium after recent  procedure, I have recommended that we proceed with definitive diagnostic surgery.  Will plan to send the uterus for frozen section to help assess whether any other procedures are indicated.  We discussed that in the setting of cancer found on frozen section, lymph node sampling may be indicated.  We reviewed the plan for a robotic assisted hysterectomy, bilateral salpingo-oophorectomy, possible lymph node dissection, possible laparotomy. The risks of surgery were discussed in detail and she understands these to include infection; wound separation; hernia; vaginal cuff separation, injury to adjacent organs such as bowel, bladder, blood vessels, ureters and nerves; bleeding which may require blood transfusion; anesthesia risk; thromboembolic events; possible death; unforeseen complications; possible need for re-exploration; medical complications such as heart attack, stroke, pleural effusion and pneumonia; if lymphadenectomy performed, risk of lower extremity lymphedema, development of a lymphocyst or lymphocele. The patient will receive DVT and antibiotic prophylaxis as indicated. She voiced a clear understanding. She had the opportunity to ask questions. Perioperative instructions were reviewed with her. Prescriptions for post-op medications were sent to her pharmacy of choice.  22 minutes of total time was spent for this patient encounter, including preparation, face-to-face counseling with the patient and coordination of care, and documentation of the encounter.  Eugene Garnet, MD  Division of Gynecologic Oncology  Department of Obstetrics and Gynecology  [facility]

## 2023-05-27 NOTE — Progress Notes (Signed)
Gynecologic Oncology Return Clinic Visit  05/29/23  Reason for Visit: treatment planning  Treatment History: The patient saw her PCP in December. Given abdominal bloating, imaging was discussed. CT was ordered but not authorized by insurance. Pelvic ultrasound was ordered.   12/24/2022: Pelvic ultrasound shows complex heterogenous mass of the upper uterine segment endometrial canal measuring 3.3 cm, containing solid and cystic components.  Nonvisualization of the right ovary.  Unremarkable left ovary.  No pelvic free fluid.   The patient reports that after she retired in 2019, she gained some weight (approximately 25 pounds).  This was related to increased eating, eating fried foods, and drinking more soda.  She reports always having a "big" stomach, but more recently has developed some abdominal bloating.   She denies any abdominal or pelvic pain.  She denies vaginal bleeding or discharge.  She reports normal bowel function without the use of medications although notes a history of constipation previously requiring her to present to the emergency department.  She was treated with lactulose at that time.  She notes some urinary frequency since being on hydrochlorothiazide.  She endorses a good appetite without nausea or emesis.   She worked for years as a Agricultural engineer.  Her daughter is a Engineer, civil (consulting).   Her GYN history is notable for starting Depo-Provera after the birth of her daughter at age 46.  She was on Depo for approximately 20-25 years.  When she stopped the Depo-Provera, she never had reinitiation of menstrual cycles.  01/14/23: Hysteroscopy with endometrial sampling. Operative findings: Mobile 8-10 cm uterus, no parametrial involvement. Normal cervix. Atrophic endocervix. Endometrium with significant adhesions and polypoid tissue. Anterior uterine mass, possible fibroid. Tubal ostia not visualized. Pictures in media.  Pathology: Very scant atrophic endometrial epithelium. Muco-inflammatory  and squamous debris with admixed benign endocervical epithelium and endocervix showing chronic endocervicitis..  We discussed follow-up given scant tissue with either definitive and diagnostic hysterectomy versus follow-up pelvic ultrasound.  The patient opted for a pelvic ultrasound.  Pelvic ultrasound in early March showed small cystic spaces measuring up to 4 mm within the central uterus, no discrete mass.  The masslike appearance of the endometrium seen on prior ultrasound is no longer seen.  Follow-up repeat imaging recommended in 3-6 months.  Pelvic ultrasound on 05/10/2025 revealed an endometrium measuring 21.2 mm, abnormal in appearance with complex echotexture and multiple cystic spaces.  Transitional zone poorly defined.  Interval History: Doing well.  Denies any vaginal bleeding or discharge.  Denies pelvic or abdominal pain.  Has constipation at baseline, unchanged.  Uses laxatives and castor oil as needed.  Past Medical/Surgical History: Past Medical History:  Diagnosis Date   Arthritis    Carpal tunnel syndrome on both sides    Full dentures    Hypertension    Type 2 diabetes mellitus (HCC)    followed by pcp    (01-12-2023 per pt pcp note in epic DM2 ,however , per pt does have diabetes anymore just takes metformen to lose weight)   Uterine mass     Past Surgical History:  Procedure Laterality Date   COLONOSCOPY WITH PROPOFOL  04/22/2021   dr Jolinda Croak   DILATATION & CURETTAGE/HYSTEROSCOPY WITH MYOSURE N/A 01/14/2023   Procedure: DILATATION & CURETTAGE/HYSTEROSCOPY WITH MYOSURE;  Surgeon: Carver Fila, MD;  Location: [facility];  Service: Gynecology;  Laterality: N/A;   MULTIPLE TOOTH EXTRACTIONS     in [place]    Family History  Problem Relation Age of Onset  Heart disease Mother 30       MI   Cancer Sister 59       lung cancer   Stroke Maternal Grandmother    Heart disease Maternal Grandfather    Stroke Daughter    Diabetes Neg Hx     Colon cancer Neg Hx    Breast cancer Neg Hx    Ovarian cancer Neg Hx    Endometrial cancer Neg Hx    Pancreatic cancer Neg Hx    Prostate cancer Neg Hx     Social History   Socioeconomic History   Marital status: Single    Spouse name: Not on file   Number of children: Not on file   Years of education: Not on file   Highest education level: Not on file  Occupational History   Occupation: retired  Tobacco Use   Smoking status: Never   Smokeless tobacco: Never  Vaping Use   Vaping status: Never Used  Substance and Sexual Activity   Alcohol use: No   Drug use: Never   Sexual activity: Not on file  Other Topics Concern   Not on file  Social History Narrative   Lives with daughter with CP [name] age 3).  No long term relationship.  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   Social Determinants of Health   Financial Resource Strain: Not on file  Food Insecurity: Not on file  Transportation Needs: Not on file  Physical Activity: Not on file  Stress: Not on file  Social Connections: Not on file    Current Medications:  Current Outpatient Medications:    senna-docusate (SENOKOT-S) 8.6-50 MG tablet, Take 2 tablets by mouth at bedtime. For AFTER surgery, do not take if having diarrhea, Disp: 30 tablet, Rfl: 0   traMADol (ULTRAM) 50 MG tablet, Take 1 tablet (50 mg total) by mouth every 6 (six) hours as needed for severe pain. For AFTER surgery only, do not take and drive, Disp: 10 tablet, Rfl: 0   cetirizine (ZYRTEC) 10 MG tablet, TAKE 1 TABLET(10 MG) BY MOUTH DAILY (Patient taking differently: Take 10 mg by mouth daily as needed for allergies.), Disp: 30 tablet, Rfl: 2   fluticasone (FLONASE) 50 MCG/ACT nasal spray, Place 1 spray into both nostrils daily as needed for allergies or rhinitis., Disp: , Rfl:    hydrochlorothiazide (HYDRODIURIL) 25 MG tablet, TAKE 1 TABLET(25 MG) BY MOUTH DAILY, Disp: 90 tablet, Rfl: 1   lidocaine (LIDODERM) 5 %, Place 1 patch onto the skin  daily. Remove & Discard patch within 12 hours or as directed by MD (Patient taking differently: Place 1 patch onto the skin daily as needed (back/shoulder pain). Remove & Discard patch within 12 hours or as directed by MD), Disp: 30 patch, Rfl: 0   MAXITROL 3.5-10000-0.1 OINT, Place 1 Application into both eyes 2 (two) times daily., Disp: , Rfl:    metFORMIN (GLUCOPHAGE-XR) 500 MG 24 hr tablet, Take 1 tablet (500 mg total) by mouth daily with breakfast. (Patient taking differently: Take 500 mg by mouth daily as needed (weight loss).), Disp: 90 tablet, Rfl: 1   naproxen (NAPROSYN) 500 MG tablet, Take 500 mg by mouth 2 (two) times daily as needed for moderate pain., Disp: , Rfl:    Olopatadine HCl 0.2 % SOLN, Apply 1 drop to eye daily. (Patient not taking: Reported on 05/19/2023), Disp: 2.5 mL, Rfl: 0   Polyethyl Glycol-Propyl Glycol (LUBRICATING EYE DROPS OP), Place 1 drop into both eyes daily  as needed (dry eyes)., Disp: , Rfl:    Vitamin D, Ergocalciferol, (DRISDOL) 1.25 MG (50000 UNIT) CAPS capsule, Take 1 capsule (50,000 Units total) by mouth every 7 (seven) days. (Patient not taking: Reported on 05/19/2023), Disp: 12 capsule, Rfl: 1  Review of Systems: Denies appetite changes, fevers, chills, fatigue, unexplained weight changes. Denies hearing loss, neck lumps or masses, mouth sores, ringing in ears or voice changes. Denies cough or wheezing.  Denies shortness of breath. Denies chest pain or palpitations. Denies leg swelling. Denies abdominal distention, pain, blood in stools, constipation, diarrhea, nausea, vomiting, or early satiety. Denies pain with intercourse, dysuria, frequency, hematuria or incontinence. Denies hot flashes, pelvic pain, vaginal bleeding or vaginal discharge.   Denies joint pain, back pain or muscle pain/cramps. Denies itching, rash, or wounds. Denies dizziness, headaches, numbness or seizures. Denies swollen lymph nodes or glands, denies easy bruising or bleeding. Denies  anxiety, depression, confusion, or decreased concentration.  Physical Exam: BP 137/70 (BP Location: Left Arm)   Pulse 80   Temp 98.2 F (36.8 C) (Oral)   Resp 16   Wt 253 lb (114.8 kg)   SpO2 96% Comment: RA  BMI 39.63 kg/m  General: Alert, oriented, no acute distress. HEENT: Normocephalic, atraumatic, sclera anicteric. Chest: Unlabored breathing on room air.  Lungs clear to auscultation bilaterally.  No wheezes or rhonchi. Cardiovascular: Regular rate and rhythm, no murmurs. Abdomen: Obese, soft, nontender.  Normoactive bowel sounds.  No masses or hepatosplenomegaly appreciated.   Extremities: Grossly normal range of motion.  Warm, well perfused.  No edema bilaterally.  Laboratory & Radiologic Studies: Hgb A1c 7.0%     Latest Ref Rng & Units 05/25/2023    8:30 AM 01/14/2023    8:08 AM 05/04/2021    4:49 PM  CBC  WBC 4.0 - 10.5 K/uL 6.8   8.8   Hemoglobin 12.0 - 15.0 g/dL 16.1  09.6  04.5   Hematocrit 36.0 - 46.0 % 40.8  46.0  43.0   Platelets 150 - 400 K/uL 273   286       Latest Ref Rng & Units 05/25/2023    8:30 AM 01/14/2023    8:08 AM 09/30/2022    9:43 AM  CMP  Glucose 70 - 99 mg/dL 409  811  83   BUN 8 - 23 mg/dL 10  7  7    Creatinine 0.44 - 1.00 mg/dL 9.14  7.82  9.56   Sodium 135 - 145 mmol/L 138  142  142   Potassium 3.5 - 5.1 mmol/L 3.8  3.4  4.2   Chloride 98 - 111 mmol/L 102  102  99   CO2 22 - 32 mmol/L 29   28   Calcium 8.9 - 10.3 mg/dL 8.6   9.4   Total Protein 6.5 - 8.1 g/dL 8.0   8.0   Total Bilirubin 0.3 - 1.2 mg/dL 0.3   0.3   Alkaline Phos 38 - 126 U/L 57   68   AST 15 - 41 U/L 20   20   ALT 0 - 44 U/L 19   12    Assessment & Plan: JALEESA CERVI is a 67 y.o. woman with recurrence of thickened and abnormal appearing endometrial complex despite previous sampling negative for hyperplasia or malignancy.  The patient continues to do very well and is asymptomatic.  We discussed again imaging findings that we reviewed by phone recently.  Given  redevelopment of markedly thickened and abnormal appearing endometrium after recent  procedure, I have recommended that we proceed with definitive diagnostic surgery.  Will plan to send the uterus for frozen section to help assess whether any other procedures are indicated.  We discussed that in the setting of cancer found on frozen section, lymph node sampling may be indicated.  We reviewed the plan for a robotic assisted hysterectomy, bilateral salpingo-oophorectomy, possible lymph node dissection, possible laparotomy. The risks of surgery were discussed in detail and she understands these to include infection; wound separation; hernia; vaginal cuff separation, injury to adjacent organs such as bowel, bladder, blood vessels, ureters and nerves; bleeding which may require blood transfusion; anesthesia risk; thromboembolic events; possible death; unforeseen complications; possible need for re-exploration; medical complications such as heart attack, stroke, pleural effusion and pneumonia; if lymphadenectomy performed, risk of lower extremity lymphedema, development of a lymphocyst or lymphocele. The patient will receive DVT and antibiotic prophylaxis as indicated. She voiced a clear understanding. She had the opportunity to ask questions. Perioperative instructions were reviewed with her. Prescriptions for post-op medications were sent to her pharmacy of choice.  22 minutes of total time was spent for this patient encounter, including preparation, face-to-face counseling with the patient and coordination of care, and documentation of the encounter.  Eugene Garnet, MD  Division of Gynecologic Oncology  Department of Obstetrics and Gynecology  [facility]

## 2023-05-28 NOTE — Patient Instructions (Signed)
Preparing for your Surgery  Plan for surgery on June 17, 2023 with Dr. Eugene Garnet at [facility] You will be scheduled for robotic assisted total laparoscopic hysterectomy (removal of the uterus and cervix), bilateral salpingo-oophorectomy (removal of both ovaries and fallopian tubes), possible staging, possible laparotomy (larger incision on your abdomen if needed).    Pre-operative Testing -(Done on 7/8) You will receive a phone call from presurgical testing at [facility] to arrange for a pre-operative appointment and lab work.  -Bring your insurance card, copy of an advanced directive if applicable, medication list  -At that visit, you will be asked to sign a consent for a possible blood transfusion in case a transfusion becomes necessary during surgery.  The need for a blood transfusion is rare but having consent is a necessary part of your care.     -You should not be taking blood thinners or aspirin at least ten days prior to surgery unless instructed by your surgeon.  -Do not take supplements such as fish oil (omega 3), red yeast rice, turmeric before your surgery. You want to avoid medications with aspirin in them including headache powders such as BC or Goody's), Excedrin migraine.  Day Before Surgery at Home -You will be asked to take in a light diet the day before surgery. You will be advised you can have clear liquids up until 3 hours before your surgery.    Eat a light diet the day before surgery.  Examples including soups, broths, toast, yogurt, mashed potatoes.  AVOID GAS PRODUCING FOODS AND BEVERAGES. Things to avoid include carbonated beverages (fizzy beverages, sodas), raw fruits and raw vegetables (uncooked), or beans.   If your bowels are filled with gas, your surgeon will have difficulty visualizing your pelvic organs which increases your surgical risks.  Your role in recovery Your role is to become active as soon as directed by your doctor, while  still giving yourself time to heal.  Rest when you feel tired. You will be asked to do the following in order to speed your recovery:  - Cough and breathe deeply. This helps to clear and expand your lungs and can prevent pneumonia after surgery.  - STAY ACTIVE WHEN YOU GET HOME. Do mild physical activity. Walking or moving your legs help your circulation and body functions return to normal. Do not try to get up or walk alone the first time after surgery.   -If you develop swelling on one leg or the other, pain in the back of your leg, redness/warmth in one of your legs, please call the office or go to the Emergency Room to have a doppler to rule out a blood clot. For shortness of breath, chest pain-seek care in the Emergency Room as soon as possible. - Actively manage your pain. Managing your pain lets you move in comfort. We will ask you to rate your pain on a scale of zero to 10. It is your responsibility to tell your doctor or nurse where and how much you hurt so your pain can be treated.  Special Considerations -If you are diabetic, you may be placed on insulin after surgery to have closer control over your blood sugars to promote healing and recovery.  This does not mean that you will be discharged on insulin.  If applicable, your oral antidiabetics will be resumed when you are tolerating a solid diet.  -Your final pathology results from surgery should be available around one week after surgery and the results will  be relayed to you when available.  -FMLA forms can be faxed to [phone] and please allow 5-7 business days for completion.  Pain Management After Surgery -You have been prescribed your pain medication and bowel regimen medications before surgery so that you can have these available when you are discharged from the hospital. The pain medication is for use ONLY AFTER surgery and a new prescription will not be given.   -Make sure that you have Tylenol and Ibuprofen (OR NAPROXEN, DO  NOT TAKE TOGETHER) IF YOU ARE ABLE TO TAKE THESE MEDICATIONS at home to use on a regular basis after surgery for pain control. We recommend alternating the medications every hour to six hours since they work differently and are processed in the body differently for pain relief.  -Review the attached handout on narcotic use and their risks and side effects.   Bowel Regimen -You have been prescribed Sennakot-S to take nightly to prevent constipation especially if you are taking the narcotic pain medication intermittently.  It is important to prevent constipation and drink adequate amounts of liquids. You can stop taking this medication when you are not taking pain medication and you are back on your normal bowel routine.  Risks of Surgery Risks of surgery are low but include bleeding, infection, damage to surrounding structures, re-operation, blood clots, and very rarely death.   Blood Transfusion Information (For the consent to be signed before surgery)  We will be checking your blood type before surgery so in case of emergencies, we will know what type of blood you would need.                                            WHAT IS A BLOOD TRANSFUSION?  A transfusion is the replacement of blood or some of its parts. Blood is made up of multiple cells which provide different functions. Red blood cells carry oxygen and are used for blood loss replacement. White blood cells fight against infection. Platelets control bleeding. Plasma helps clot blood. Other blood products are available for specialized needs, such as hemophilia or other clotting disorders. BEFORE THE TRANSFUSION  Who gives blood for transfusions?  You may be able to donate blood to be used at a later date on yourself (autologous donation). Relatives can be asked to donate blood. This is generally not any safer than if you have received blood from a stranger. The same precautions are taken to ensure safety when a relative's blood is  donated. Healthy volunteers who are fully evaluated to make sure their blood is safe. This is blood bank blood. Transfusion therapy is the safest it has ever been in the practice of medicine. Before blood is taken from a donor, a complete history is taken to make sure that person has no history of diseases nor engages in risky social behavior (examples are intravenous drug use or sexual activity with multiple partners). The donor's travel history is screened to minimize risk of transmitting infections, such as malaria. The donated blood is tested for signs of infectious diseases, such as HIV and hepatitis. The blood is then tested to be sure it is compatible with you in order to minimize the chance of a transfusion reaction. If you or a relative donates blood, this is often done in anticipation of surgery and is not appropriate for emergency situations. It takes many days to process the donated  blood. RISKS AND COMPLICATIONS Although transfusion therapy is very safe and saves many lives, the main dangers of transfusion include:  Getting an infectious disease. Developing a transfusion reaction. This is an allergic reaction to something in the blood you were given. Every precaution is taken to prevent this. The decision to have a blood transfusion has been considered carefully by your caregiver before blood is given. Blood is not given unless the benefits outweigh the risks.  AFTER SURGERY INSTRUCTIONS  Return to work: 4-6 weeks if applicable  Activity: 1. Be up and out of the bed during the day.  Take a nap if needed.  You may walk up steps but be careful and use the hand rail.  Stair climbing will tire you more than you think, you may need to stop part way and rest.   2. No lifting or straining for 6 weeks over 10 pounds. No pushing, pulling, straining for 6 weeks.  3. No driving for around 1 week(s).  Do not drive if you are taking narcotic pain medicine and make sure that your reaction time has  returned.   4. You can shower as soon as the next day after surgery. Shower daily.  Use your regular soap and water (not directly on the incision) and pat your incision(s) dry afterwards; don't rub.  No tub baths or submerging your body in water until cleared by your surgeon. If you have the soap that was given to you by pre-surgical testing that was used before surgery, you do not need to use it afterwards because this can irritate your incisions.   5. No sexual activity and nothing in the vagina for 8-10 weeks.  6. You may experience a small amount of clear drainage from your incisions, which is normal.  If the drainage persists, increases, or changes color please call the office.  7. Do not use creams, lotions, or ointments such as neosporin on your incisions after surgery until advised by your surgeon because they can cause removal of the dermabond glue on your incisions.    8. You may experience vaginal spotting after surgery or around the 6-8 week mark from surgery when the stitches at the top of the vagina begin to dissolve.  The spotting is normal but if you experience heavy bleeding, call our office.  9. Take Tylenol or ibuprofen (OR NAPROXEN, DO NOT TAKE TOGETHER) first for pain if you are able to take these medications and only use narcotic pain medication for severe pain not relieved by the Tylenol or Ibuprofen (OR NAPROXEN).  Monitor your Tylenol intake to a max of 4,000 mg in a 24 hour period. You can alternate these medications after surgery.  Diet: 1. Low sodium Heart Healthy Diet is recommended but you are cleared to resume your normal (before surgery) diet after your procedure.  2. It is safe to use a laxative, such as Miralax or Colace, if you have difficulty moving your bowels. You have been prescribed Sennakot-S to take at bedtime every evening after surgery to keep bowel movements regular and to prevent constipation.    Wound Care: 1. Keep clean and dry.  Shower  daily.  Reasons to call the Doctor: Fever - Oral temperature greater than 100.4 degrees Fahrenheit Foul-smelling vaginal discharge Difficulty urinating Nausea and vomiting Increased pain at the site of the incision that is unrelieved with pain medicine. Difficulty breathing with or without chest pain New calf pain especially if only on one side Sudden, continuing increased vaginal bleeding with  or without clots.   Contacts: For questions or concerns you should contact:  Dr. Eugene Garnet at [phone]  Warner Mccreedy, NP at [phone]  After Hours: call [phone] and have the GYN Oncologist paged/contacted (after 5 pm or on the weekends). You will speak with an after hours RN and let he or she know you have had surgery.  Messages sent via mychart are for non-urgent matters and are not responded to after hours so for urgent needs, please call the after hours number.

## 2023-05-29 ENCOUNTER — Inpatient Hospital Stay: Payer: Medicare HMO | Admitting: Gynecologic Oncology

## 2023-05-29 ENCOUNTER — Inpatient Hospital Stay: Payer: Medicare HMO | Attending: Gynecologic Oncology | Admitting: Gynecologic Oncology

## 2023-05-29 ENCOUNTER — Encounter: Payer: Self-pay | Admitting: Gynecologic Oncology

## 2023-05-29 ENCOUNTER — Other Ambulatory Visit: Payer: Self-pay

## 2023-05-29 VITALS — BP 137/70 | HR 80 | Temp 98.2°F | Resp 16 | Wt 253.0 lb

## 2023-05-29 DIAGNOSIS — R9389 Abnormal findings on diagnostic imaging of other specified body structures: Secondary | ICD-10-CM

## 2023-05-29 DIAGNOSIS — E669 Obesity, unspecified: Secondary | ICD-10-CM

## 2023-05-29 DIAGNOSIS — N858 Other specified noninflammatory disorders of uterus: Secondary | ICD-10-CM

## 2023-05-29 MED ORDER — TRAMADOL HCL 50 MG PO TABS
50.0000 mg | ORAL_TABLET | Freq: Four times a day (QID) | ORAL | 0 refills | Status: DC | PRN
Start: 2023-05-29 — End: 2023-07-16

## 2023-05-29 MED ORDER — SENNOSIDES-DOCUSATE SODIUM 8.6-50 MG PO TABS
2.0000 | ORAL_TABLET | Freq: Every day | ORAL | 0 refills | Status: DC
Start: 2023-05-29 — End: 2023-07-16

## 2023-05-29 NOTE — Patient Instructions (Signed)
Preparing for your Surgery  Plan for surgery on June 17, 2023 with Dr. Katherine Tucker at [facility]. You will be scheduled for robotic assisted total laparoscopic hysterectomy (removal of the uterus and cervix), bilateral salpingo-oophorectomy (removal of both ovaries and fallopian tubes), possible staging, possible laparotomy (larger incision on your abdomen if needed).    Pre-operative Testing -(Done on 7/8) You will receive a phone call from presurgical testing at [facility] to arrange for a pre-operative appointment and lab work.  -Bring your insurance card, copy of an advanced directive if applicable, medication list  -At that visit, you will be asked to sign a consent for a possible blood transfusion in case a transfusion becomes necessary during surgery.  The need for a blood transfusion is rare but having consent is a necessary part of your care.     -You should not be taking blood thinners or aspirin at least ten days prior to surgery unless instructed by your surgeon.  -Do not take supplements such as fish oil (omega 3), red yeast rice, turmeric before your surgery. You want to avoid medications with aspirin in them including headache powders such as BC or Goody's), Excedrin migraine.  Day Before Surgery at Home -You will be asked to take in a light diet the day before surgery. You will be advised you can have clear liquids up until 3 hours before your surgery.    Eat a light diet the day before surgery.  Examples including soups, broths, toast, yogurt, mashed potatoes.  AVOID GAS PRODUCING FOODS AND BEVERAGES. Things to avoid include carbonated beverages (fizzy beverages, sodas), raw fruits and raw vegetables (uncooked), or beans.   If your bowels are filled with gas, your surgeon will have difficulty visualizing your pelvic organs which increases your surgical risks.  Your role in recovery Your role is to become active as soon as directed by your doctor, while  still giving yourself time to heal.  Rest when you feel tired. You will be asked to do the following in order to speed your recovery:  - Cough and breathe deeply. This helps to clear and expand your lungs and can prevent pneumonia after surgery.  - STAY ACTIVE WHEN YOU GET HOME. Do mild physical activity. Walking or moving your legs help your circulation and body functions return to normal. Do not try to get up or walk alone the first time after surgery.   -If you develop swelling on one leg or the other, pain in the back of your leg, redness/warmth in one of your legs, please call the office or go to the Emergency Room to have a doppler to rule out a blood clot. For shortness of breath, chest pain-seek care in the Emergency Room as soon as possible. - Actively manage your pain. Managing your pain lets you move in comfort. We will ask you to rate your pain on a scale of zero to 10. It is your responsibility to tell your doctor or nurse where and how much you hurt so your pain can be treated.  Special Considerations -If you are diabetic, you may be placed on insulin after surgery to have closer control over your blood sugars to promote healing and recovery.  This does not mean that you will be discharged on insulin.  If applicable, your oral antidiabetics will be resumed when you are tolerating a solid diet.  -Your final pathology results from surgery should be available around one week after surgery and the results will   be relayed to you when available.  -FMLA forms can be faxed to [phone] and please allow 5-7 business days for completion.  Pain Management After Surgery -You have been prescribed your pain medication and bowel regimen medications before surgery so that you can have these available when you are discharged from the hospital. The pain medication is for use ONLY AFTER surgery and a new prescription will not be given.   -Make sure that you have Tylenol and Ibuprofen (OR NAPROXEN, DO  NOT TAKE TOGETHER) IF YOU ARE ABLE TO TAKE THESE MEDICATIONS at home to use on a regular basis after surgery for pain control. We recommend alternating the medications every hour to six hours since they work differently and are processed in the body differently for pain relief.  -Review the attached handout on narcotic use and their risks and side effects.   Bowel Regimen -You have been prescribed Sennakot-S to take nightly to prevent constipation especially if you are taking the narcotic pain medication intermittently.  It is important to prevent constipation and drink adequate amounts of liquids. You can stop taking this medication when you are not taking pain medication and you are back on your normal bowel routine.  Risks of Surgery Risks of surgery are low but include bleeding, infection, damage to surrounding structures, re-operation, blood clots, and very rarely death.   Blood Transfusion Information (For the consent to be signed before surgery)  We will be checking your blood type before surgery so in case of emergencies, we will know what type of blood you would need.                                            WHAT IS A BLOOD TRANSFUSION?  A transfusion is the replacement of blood or some of its parts. Blood is made up of multiple cells which provide different functions. Red blood cells carry oxygen and are used for blood loss replacement. White blood cells fight against infection. Platelets control bleeding. Plasma helps clot blood. Other blood products are available for specialized needs, such as hemophilia or other clotting disorders. BEFORE THE TRANSFUSION  Who gives blood for transfusions?  You may be able to donate blood to be used at a later date on yourself (autologous donation). Relatives can be asked to donate blood. This is generally not any safer than if you have received blood from a stranger. The same precautions are taken to ensure safety when a relative's blood is  donated. Healthy volunteers who are fully evaluated to make sure their blood is safe. This is blood bank blood. Transfusion therapy is the safest it has ever been in the practice of medicine. Before blood is taken from a donor, a complete history is taken to make sure that person has no history of diseases nor engages in risky social behavior (examples are intravenous drug use or sexual activity with multiple partners). The donor's travel history is screened to minimize risk of transmitting infections, such as malaria. The donated blood is tested for signs of infectious diseases, such as HIV and hepatitis. The blood is then tested to be sure it is compatible with you in order to minimize the chance of a transfusion reaction. If you or a relative donates blood, this is often done in anticipation of surgery and is not appropriate for emergency situations. It takes many days to process the donated   blood. RISKS AND COMPLICATIONS Although transfusion therapy is very safe and saves many lives, the main dangers of transfusion include:  Getting an infectious disease. Developing a transfusion reaction. This is an allergic reaction to something in the blood you were given. Every precaution is taken to prevent this. The decision to have a blood transfusion has been considered carefully by your caregiver before blood is given. Blood is not given unless the benefits outweigh the risks.  AFTER SURGERY INSTRUCTIONS  Return to work: 4-6 weeks if applicable  Activity: 1. Be up and out of the bed during the day.  Take a nap if needed.  You may walk up steps but be careful and use the hand rail.  Stair climbing will tire you more than you think, you may need to stop part way and rest.   2. No lifting or straining for 6 weeks over 10 pounds. No pushing, pulling, straining for 6 weeks.  3. No driving for around 1 week(s).  Do not drive if you are taking narcotic pain medicine and make sure that your reaction time has  returned.   4. You can shower as soon as the next day after surgery. Shower daily.  Use your regular soap and water (not directly on the incision) and pat your incision(s) dry afterwards; don't rub.  No tub baths or submerging your body in water until cleared by your surgeon. If you have the soap that was given to you by pre-surgical testing that was used before surgery, you do not need to use it afterwards because this can irritate your incisions.   5. No sexual activity and nothing in the vagina for 8-10 weeks.  6. You may experience a small amount of clear drainage from your incisions, which is normal.  If the drainage persists, increases, or changes color please call the office.  7. Do not use creams, lotions, or ointments such as neosporin on your incisions after surgery until advised by your surgeon because they can cause removal of the dermabond glue on your incisions.    8. You may experience vaginal spotting after surgery or around the 6-8 week mark from surgery when the stitches at the top of the vagina begin to dissolve.  The spotting is normal but if you experience heavy bleeding, call our office.  9. Take Tylenol or ibuprofen (OR NAPROXEN, DO NOT TAKE TOGETHER) first for pain if you are able to take these medications and only use narcotic pain medication for severe pain not relieved by the Tylenol or Ibuprofen (OR NAPROXEN).  Monitor your Tylenol intake to a max of 4,000 mg in a 24 hour period. You can alternate these medications after surgery.  Diet: 1. Low sodium Heart Healthy Diet is recommended but you are cleared to resume your normal (before surgery) diet after your procedure.  2. It is safe to use a laxative, such as Miralax or Colace, if you have difficulty moving your bowels. You have been prescribed Sennakot-S to take at bedtime every evening after surgery to keep bowel movements regular and to prevent constipation.    Wound Care: 1. Keep clean and dry.  Shower  daily.  Reasons to call the Doctor: Fever - Oral temperature greater than 100.4 degrees Fahrenheit Foul-smelling vaginal discharge Difficulty urinating Nausea and vomiting Increased pain at the site of the incision that is unrelieved with pain medicine. Difficulty breathing with or without chest pain New calf pain especially if only on one side Sudden, continuing increased vaginal bleeding with   or without clots.   Contacts: For questions or concerns you should contact:  Dr. Katherine Tucker at [phone]  Timberlyn Pickford, NP at [phone]  After Hours: call [phone] and have the GYN Oncologist paged/contacted (after 5 pm or on the weekends). You will speak with an after hours RN and let he or she know you have had surgery.  Messages sent via mychart are for non-urgent matters and are not responded to after hours so for urgent needs, please call the after hours number.      

## 2023-05-29 NOTE — Progress Notes (Signed)
Patient here for follow up with Dr. Pricilla Holm and for a pre-operative appointment prior to her scheduled surgery on June 17, 2023. She is scheduled for RATH, BSO, possible staging.  She had her pre-admission testing appointment earlier in the month at [facility].  The surgery was discussed in detail.  See after visit summary for additional details.      Discussed post-op pain management in detail including the aspects of the enhanced recovery pathway.  Advised her that a new prescription would be sent in for tramadol and it is only to be used for after her upcoming surgery.  We discussed the use of tylenol post-op and to monitor for a maximum of 4,000 mg in a 24 hour period.  Also prescribed sennakot to be used after surgery and to hold if having loose stools.  Discussed bowel regimen in detail.     Discussed measures to take at home to prevent DVT including frequent mobility.  Reportable signs and symptoms of DVT discussed. Post-operative instructions discussed and expectations for after surgery. Incisional care discussed as well including reportable signs and symptoms including erythema, drainage, wound separation.     10 minutes spent with the patient/preparing information.  Verbalizing understanding of material discussed. No needs or concerns voiced at the end of the visit.   Advised patient to call for any needs.  Advised that her post-operative medications had been prescribed and could be picked up at any time.    This appointment is included in the global surgical bundle as pre-operative teaching and has no charge.

## 2023-06-03 DIAGNOSIS — E119 Type 2 diabetes mellitus without complications: Secondary | ICD-10-CM

## 2023-06-03 DIAGNOSIS — N858 Other specified noninflammatory disorders of uterus: Secondary | ICD-10-CM

## 2023-06-03 LAB — TYPE AND SCREEN
ABO/RH(D): O POS
Antibody Screen: NEGATIVE

## 2023-06-08 ENCOUNTER — Encounter: Payer: Self-pay | Admitting: Student

## 2023-06-08 ENCOUNTER — Ambulatory Visit (INDEPENDENT_AMBULATORY_CARE_PROVIDER_SITE_OTHER): Payer: Medicare HMO | Admitting: Student

## 2023-06-08 VITALS — BP 145/73 | HR 74 | Ht 67.0 in | Wt 256.0 lb

## 2023-06-08 DIAGNOSIS — G5603 Carpal tunnel syndrome, bilateral upper limbs: Secondary | ICD-10-CM | POA: Diagnosis not present

## 2023-06-08 DIAGNOSIS — E119 Type 2 diabetes mellitus without complications: Secondary | ICD-10-CM | POA: Diagnosis not present

## 2023-06-08 DIAGNOSIS — I10 Essential (primary) hypertension: Secondary | ICD-10-CM | POA: Diagnosis not present

## 2023-06-08 MED ORDER — OLMESARTAN MEDOXOMIL 20 MG PO TABS
20.0000 mg | ORAL_TABLET | Freq: Every day | ORAL | 0 refills | Status: DC
Start: 2023-06-08 — End: 2023-06-30

## 2023-06-08 MED ORDER — HYDROCHLOROTHIAZIDE 25 MG PO TABS: ORAL_TABLET | ORAL | 0 refills | Status: DC

## 2023-06-08 NOTE — Progress Notes (Signed)
    SUBJECTIVE:   CHIEF COMPLAINT / HPI:   T2DM Takes metformin but not consistently -thought she was only taking for weight loss effects per pt. last A1c of 7 on 05/25/23. She was previously prescribed Ozempic but never picked it up.  HTN Takes hydrochlorothiazide 25 mg daily but did not take this morning. Does check BP at home, most of the time it over 140 systolic and 70-80's diastolic.  Felling well today.   Paresthesias in R hand States from known carpal tunnel. 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 Bought a wrist splint she saw on TV advertised for carpal tunnel. Wearing the splint only when she has the numbness and tingling which she has mostly at night.   PERTINENT  PMH / PSH: T2DM, obesity, uterine mass, chronic L shoulder pain HLDanxiety and depression  OBJECTIVE:   BP (!) 145/73   Pulse 74   Ht 5\' 7"  (1.702 m)   Wt 256 lb (116.1 kg)   SpO2 99%   BMI 40.10 kg/m    General: NAD, pleasant, able to participate in exam Cardiac: RRR, no murmurs. Respiratory: CTAB, normal effort, No wheezes, rales or rhonchi Skin: warm and dry Extremities: No erythema or edema of right wrist or finger joints Neuro: alert, no obvious focal deficits Psych: Normal affect and mood  ASSESSMENT/PLAN:   Carpal tunnel syndrome, bilateral Symptoms consistent with known carpal tunnel.  Patient advised to wear wrist splint every night before going to bed and see if this helps prevent her symptoms.  Type 2 diabetes mellitus without complication, without long-term current use of insulin (HCC) -Start taking Metformin 500 mg daily, hold on day of upcoming surgery -Patient to return 8/13 to discuss medication options for diabetes -Can consider represcribing Ozempic -Microalbumin creatinine ratio today  Essential hypertension Uncontrolled. -Hydrochlorothiazide 25 mg daily -Add olmesartan 20 mg daily -Lab only visit for BMP in a week prior to her upcoming surgery -Follow-up  appointment made for 8/13     Dr. Erick Alley, DO [facility]

## 2023-06-08 NOTE — Assessment & Plan Note (Signed)
Uncontrolled. -Hydrochlorothiazide 25 mg daily -Add olmesartan 20 mg daily -Lab only visit for BMP in a week prior to her upcoming surgery -Follow-up appointment made for 8/13

## 2023-06-08 NOTE — Assessment & Plan Note (Signed)
Symptoms consistent with known carpal tunnel.  Patient advised to wear wrist splint every night before going to bed and see if this helps prevent her symptoms.

## 2023-06-08 NOTE — Patient Instructions (Addendum)
It was great to see you! Thank you for allowing me to participate in your care!  I recommend that you always bring your medications to each appointment as this makes it easy to ensure you are on the correct medications and helps [place] not miss when refills are needed.  Our plans for today:  -A new medication called olmesartan or Benicar has been sent to your pharmacy.  Start taking once daily for hypertension in addition to your hydrochlorothiazide. -Return in 1 week for  lab visit -Start wearing your wrist splint before you go to bed at night -Return after your surgery to discuss diabetes management  We are checking some labs today, I will call you if they are abnormal will send you a MyChart message or a letter if they are normal.  If you do not hear about your labs in the next 2 weeks please let us know.  Take care and seek immediate care sooner if you develop any concerns.   Dr. Erick Alley, DO [facility]

## 2023-06-08 NOTE — Assessment & Plan Note (Addendum)
-  Start taking Metformin 500 mg daily, hold on day of upcoming surgery -Patient to return 8/13 to discuss medication options for diabetes -Can consider represcribing Ozempic -Microalbumin creatinine ratio today

## 2023-06-09 LAB — MICROALBUMIN / CREATININE URINE RATIO
Creatinine, Urine: 29.1 mg/dL
Microalb/Creat Ratio: <10 mg/g creat (ref 0–29)
Microalbumin, Urine: <3 ug/mL

## 2023-06-11 ENCOUNTER — Encounter: Payer: Self-pay | Admitting: Student

## 2023-06-12 NOTE — Progress Notes (Signed)
Pt was contacted on 06/12/23 in regards to update on new date and time of surgery.  PT aware to be here at [facility] on 06/17/23 at 0515am and surgery at 0730am.  Pt has copy of instructions from original day of 06/03/23.  PT instructed to follow those instrucitons and may have liquids until 0515am morning of surgery. Pt reported she has been given a new blood pressure pill , olmesartan , by PCP which she has not started yet per pt because she did not want to start anything new prior to surgery.  PT STates she will start after surgery. MEd is listed on Med record.  NO new changes in med hx or meds per pt.

## 2023-06-15 ENCOUNTER — Telehealth: Payer: Self-pay

## 2023-06-15 ENCOUNTER — Other Ambulatory Visit: Payer: Medicare HMO

## 2023-06-15 DIAGNOSIS — I10 Essential (primary) hypertension: Secondary | ICD-10-CM | POA: Diagnosis not present

## 2023-06-15 NOTE — Telephone Encounter (Signed)
Janet Woods called office today stating she couldn't remember the times for her surgery.   Patient compliant with pre-operative instructions.  Reinforced nothing to eat after midnight. Clear liquids until 0415. Patient to arrive at 0515.  No questions or concerns voiced.  Instructed to call for any needs.

## 2023-06-16 NOTE — Anesthesia Preprocedure Evaluation (Signed)
Anesthesia Evaluation  Patient identified by MRN, date of birth, ID band Patient awake    Reviewed: Allergy & Precautions, NPO status , Patient's Chart, lab work & pertinent test results  Airway Mallampati: II  TM Distance: >3 FB Neck ROM: Full    Dental  (+) Upper Dentures, Lower Dentures   Pulmonary neg pulmonary ROS   Pulmonary exam normal        Cardiovascular hypertension, Pt. on medications  Rhythm:Regular Rate:Normal     Neuro/Psych negative neurological ROS  negative psych ROS   GI/Hepatic Neg liver ROS,GERD  ,,  Endo/Other  diabetes, Type 2, Oral Hypoglycemic Agents    Renal/GU negative Renal ROS  Female GU complaint Uterine mass     Musculoskeletal  (+) Arthritis , Osteoarthritis,    Abdominal Normal abdominal exam  (+)   Peds  Hematology Lab Results      Component                Value               Date                      WBC                      6.8                 05/25/2023                HGB                      12.8                05/25/2023                HCT                      40.8                05/25/2023                MCV                      89.5                05/25/2023                PLT                      273                 05/25/2023             Lab Results      Component                Value               Date                      NA                       141                 06/15/2023                K  4.2                 06/15/2023                CO2                      27                  06/15/2023                GLUCOSE                  99                  06/15/2023                BUN                      10                  06/15/2023                CREATININE               0.70                06/15/2023                CALCIUM                  9.3                 06/15/2023                EGFR                     96                  06/15/2023                 GFRNONAA                 >60                 05/25/2023              Anesthesia Other Findings   Reproductive/Obstetrics                             Anesthesia Physical Anesthesia Plan  ASA: 3  Anesthesia Plan: General   Post-op Pain Management: Celebrex PO (pre-op)* and Tylenol PO (pre-op)*   Induction: Intravenous  PONV Risk Score and Plan: 3 and Ondansetron, Dexamethasone, Midazolam and Treatment may vary due to age or medical condition  Airway Management Planned: Mask and Oral ETT  Additional Equipment: None  Intra-op Plan:   Post-operative Plan: Extubation in OR  Informed Consent: I have reviewed the patients History and Physical, chart, labs and discussed the procedure including the risks, benefits and alternatives for the proposed anesthesia with the patient or authorized representative who has indicated his/her understanding and acceptance.     Dental advisory given  Plan Discussed with: CRNA  Anesthesia Plan Comments:        Anesthesia Quick Evaluation

## 2023-06-17 ENCOUNTER — Ambulatory Visit (HOSPITAL_COMMUNITY)
Admission: RE | Admit: 2023-06-17 | Discharge: 2023-06-17 | Disposition: A | Discharge status: Home / Self Care | Payer: Medicare HMO | Source: Ambulatory Visit | Attending: Gynecologic Oncology | Admitting: Gynecologic Oncology

## 2023-06-17 ENCOUNTER — Ambulatory Visit (HOSPITAL_BASED_OUTPATIENT_CLINIC_OR_DEPARTMENT_OTHER): Payer: Medicare HMO | Admitting: Anesthesiology

## 2023-06-17 ENCOUNTER — Other Ambulatory Visit: Payer: Self-pay

## 2023-06-17 ENCOUNTER — Encounter: Payer: Self-pay | Admitting: Student

## 2023-06-17 ENCOUNTER — Encounter (HOSPITAL_COMMUNITY)
Admission: RE | Disposition: A | Discharge status: Home / Self Care | Payer: Self-pay | Source: Ambulatory Visit | Attending: Gynecologic Oncology

## 2023-06-17 ENCOUNTER — Encounter (HOSPITAL_COMMUNITY): Payer: Self-pay | Admitting: Gynecologic Oncology

## 2023-06-17 ENCOUNTER — Ambulatory Visit (HOSPITAL_COMMUNITY): Payer: Self-pay | Admitting: Anesthesiology

## 2023-06-17 DIAGNOSIS — N8003 Adenomyosis of the uterus: Secondary | ICD-10-CM | POA: Diagnosis not present

## 2023-06-17 DIAGNOSIS — Z79899 Other long term (current) drug therapy: Secondary | ICD-10-CM | POA: Diagnosis not present

## 2023-06-17 DIAGNOSIS — Z7984 Long term (current) use of oral hypoglycemic drugs: Secondary | ICD-10-CM | POA: Diagnosis not present

## 2023-06-17 DIAGNOSIS — E119 Type 2 diabetes mellitus without complications: Secondary | ICD-10-CM

## 2023-06-17 DIAGNOSIS — Z01818 Encounter for other preprocedural examination: Secondary | ICD-10-CM

## 2023-06-17 DIAGNOSIS — Q742 Other congenital malformations of lower limb(s), including pelvic girdle: Secondary | ICD-10-CM | POA: Diagnosis not present

## 2023-06-17 DIAGNOSIS — N840 Polyp of corpus uteri: Secondary | ICD-10-CM | POA: Diagnosis not present

## 2023-06-17 DIAGNOSIS — N858 Other specified noninflammatory disorders of uterus: Secondary | ICD-10-CM | POA: Diagnosis not present

## 2023-06-17 DIAGNOSIS — I10 Essential (primary) hypertension: Secondary | ICD-10-CM | POA: Insufficient documentation

## 2023-06-17 DIAGNOSIS — R9389 Abnormal findings on diagnostic imaging of other specified body structures: Secondary | ICD-10-CM | POA: Diagnosis not present

## 2023-06-17 DIAGNOSIS — E785 Hyperlipidemia, unspecified: Secondary | ICD-10-CM | POA: Diagnosis not present

## 2023-06-17 DIAGNOSIS — Z6839 Body mass index (BMI) 39.0-39.9, adult: Secondary | ICD-10-CM

## 2023-06-17 DIAGNOSIS — N8502 Endometrial intraepithelial neoplasia [EIN]: Secondary | ICD-10-CM | POA: Diagnosis not present

## 2023-06-17 HISTORY — PX: ROBOTIC ASSISTED TOTAL HYSTERECTOMY WITH BILATERAL SALPINGO OOPHERECTOMY: SHX6086

## 2023-06-17 LAB — GLUCOSE, CAPILLARY
Glucose-Capillary: 119 mg/dL — ABNORMAL HIGH (ref 70–99)
Glucose-Capillary: 142 mg/dL — ABNORMAL HIGH (ref 70–99)

## 2023-06-17 SURGERY — HYSTERECTOMY, TOTAL, ROBOT-ASSISTED, LAPAROSCOPIC, WITH BILATERAL SALPINGO-OOPHORECTOMY
Anesthesia: General | Laterality: Bilateral

## 2023-06-17 MED ORDER — DEXAMETHASONE SODIUM PHOSPHATE 4 MG/ML IJ SOLN: 4.0000 mg | INTRAMUSCULAR | Status: DC

## 2023-06-17 MED ORDER — BUPIVACAINE HCL 0.25 % IJ SOLN
INTRAMUSCULAR | Status: AC
  Filled 2023-06-17: qty 1

## 2023-06-17 MED ORDER — ROCURONIUM BROMIDE 100 MG/10ML IV SOLN
INTRAVENOUS | Status: DC | PRN
  Administered 2023-06-17: 60 mg via INTRAVENOUS

## 2023-06-17 MED ORDER — FENTANYL CITRATE (PF) 100 MCG/2ML IJ SOLN
INTRAMUSCULAR | Status: AC
  Filled 2023-06-17: qty 2

## 2023-06-17 MED ORDER — CEFAZOLIN SODIUM-DEXTROSE 2-4 GM/100ML-% IV SOLN
2.0000 g | INTRAVENOUS | Status: DC
  Filled 2023-06-17: qty 100

## 2023-06-17 MED ORDER — ACETAMINOPHEN 500 MG PO TABS
1000.0000 mg | ORAL_TABLET | ORAL | Status: AC
  Administered 2023-06-17: 1000 mg via ORAL
  Filled 2023-06-17: qty 2

## 2023-06-17 MED ORDER — CHLORHEXIDINE GLUCONATE 0.12 % MT SOLN
15.0000 mL | Freq: Once | OROMUCOSAL | Status: AC
  Administered 2023-06-17: 15 mL via OROMUCOSAL

## 2023-06-17 MED ORDER — ROCURONIUM BROMIDE 10 MG/ML (PF) SYRINGE
PREFILLED_SYRINGE | INTRAVENOUS | Status: AC
  Filled 2023-06-17: qty 10

## 2023-06-17 MED ORDER — LIDOCAINE HCL (PF) 2 % IJ SOLN
INTRAMUSCULAR | Status: AC
  Filled 2023-06-17: qty 5

## 2023-06-17 MED ORDER — EPHEDRINE SULFATE-NACL 50-0.9 MG/10ML-% IV SOSY
PREFILLED_SYRINGE | INTRAVENOUS | Status: DC | PRN
  Administered 2023-06-17: 10 mg via INTRAVENOUS

## 2023-06-17 MED ORDER — DEXMEDETOMIDINE HCL IN NACL 80 MCG/20ML IV SOLN
INTRAVENOUS | Status: AC
  Filled 2023-06-17: qty 20

## 2023-06-17 MED ORDER — HEPARIN SODIUM (PORCINE) 5000 UNIT/ML IJ SOLN
5000.0000 [IU] | INTRAMUSCULAR | Status: AC
  Administered 2023-06-17: 5000 [IU] via SUBCUTANEOUS
  Filled 2023-06-17: qty 1

## 2023-06-17 MED ORDER — EPHEDRINE 5 MG/ML INJ
INTRAVENOUS | Status: AC
  Filled 2023-06-17: qty 5

## 2023-06-17 MED ORDER — METRONIDAZOLE 500 MG/100ML IV SOLN
500.0000 mg | INTRAVENOUS | Status: DC
  Filled 2023-06-17: qty 100

## 2023-06-17 MED ORDER — PROPOFOL 10 MG/ML IV BOLUS
INTRAVENOUS | Status: DC | PRN
Start: 2023-06-17 — End: 2023-06-17
  Administered 2023-06-17: 180 mg via INTRAVENOUS

## 2023-06-17 MED ORDER — PROPOFOL 10 MG/ML IV BOLUS
INTRAVENOUS | Status: AC
  Filled 2023-06-17: qty 20

## 2023-06-17 MED ORDER — CELECOXIB 200 MG PO CAPS
200.0000 mg | ORAL_CAPSULE | Freq: Once | ORAL | Status: AC
  Administered 2023-06-17: 200 mg via ORAL
  Filled 2023-06-17: qty 1

## 2023-06-17 MED ORDER — INSULIN ASPART 100 UNIT/ML IJ SOLN: 0.0000 [IU] | INTRAMUSCULAR | Status: DC | PRN

## 2023-06-17 MED ORDER — SUCCINYLCHOLINE CHLORIDE 200 MG/10ML IV SOSY
PREFILLED_SYRINGE | INTRAVENOUS | Status: AC
  Filled 2023-06-17: qty 10

## 2023-06-17 MED ORDER — PHENYLEPHRINE HCL (PRESSORS) 10 MG/ML IV SOLN
INTRAVENOUS | Status: AC
  Filled 2023-06-17: qty 1

## 2023-06-17 MED ORDER — FENTANYL CITRATE PF 50 MCG/ML IJ SOSY: 25.0000 ug | PREFILLED_SYRINGE | INTRAMUSCULAR | Status: DC | PRN

## 2023-06-17 MED ORDER — FENTANYL CITRATE (PF) 100 MCG/2ML IJ SOLN
INTRAMUSCULAR | Status: DC | PRN
  Administered 2023-06-17: 100 ug via INTRAVENOUS
  Administered 2023-06-17: 25 ug via INTRAVENOUS
  Administered 2023-06-17: 100 ug via INTRAVENOUS

## 2023-06-17 MED ORDER — MIDAZOLAM HCL 2 MG/2ML IJ SOLN
INTRAMUSCULAR | Status: AC
  Filled 2023-06-17: qty 2

## 2023-06-17 MED ORDER — PHENYLEPHRINE 80 MCG/ML (10ML) SYRINGE FOR IV PUSH (FOR BLOOD PRESSURE SUPPORT)
PREFILLED_SYRINGE | INTRAVENOUS | Status: DC | PRN
  Administered 2023-06-17: 160 ug via INTRAVENOUS
  Administered 2023-06-17: 80 ug via INTRAVENOUS

## 2023-06-17 MED ORDER — ORAL CARE MOUTH RINSE: 15.0000 mL | Freq: Once | OROMUCOSAL | Status: AC

## 2023-06-17 MED ORDER — SUCCINYLCHOLINE CHLORIDE 200 MG/10ML IV SOSY
PREFILLED_SYRINGE | INTRAVENOUS | Status: DC | PRN
  Administered 2023-06-17: 140 mg via INTRAVENOUS

## 2023-06-17 MED ORDER — BUPIVACAINE HCL 0.25 % IJ SOLN
INTRAMUSCULAR | Status: DC | PRN
  Administered 2023-06-17: 30 mL

## 2023-06-17 MED ORDER — ACETAMINOPHEN 500 MG PO TABS: 1000.0000 mg | ORAL_TABLET | Freq: Once | ORAL | Status: DC

## 2023-06-17 MED ORDER — FENTANYL CITRATE (PF) 250 MCG/5ML IJ SOLN
INTRAMUSCULAR | Status: AC
  Filled 2023-06-17: qty 5

## 2023-06-17 MED ORDER — STERILE WATER FOR IRRIGATION IR SOLN
Status: DC | PRN
  Administered 2023-06-17: 1000 mL

## 2023-06-17 MED ORDER — LACTATED RINGERS IV SOLN: INTRAVENOUS | Status: DC

## 2023-06-17 MED ORDER — LACTATED RINGERS IR SOLN
Status: DC | PRN
  Administered 2023-06-17: 1000 mL

## 2023-06-17 MED ORDER — DEXMEDETOMIDINE HCL IN NACL 80 MCG/20ML IV SOLN
INTRAVENOUS | Status: DC | PRN
  Administered 2023-06-17: 12 ug via INTRAVENOUS

## 2023-06-17 MED ORDER — AMISULPRIDE (ANTIEMETIC) 5 MG/2ML IV SOLN
INTRAVENOUS | Status: AC
  Filled 2023-06-17: qty 4

## 2023-06-17 MED ORDER — AMISULPRIDE (ANTIEMETIC) 5 MG/2ML IV SOLN
10.0000 mg | Freq: Once | INTRAVENOUS | Status: AC | PRN
  Administered 2023-06-17: 10 mg via INTRAVENOUS

## 2023-06-17 MED ORDER — SUGAMMADEX SODIUM 500 MG/5ML IV SOLN
INTRAVENOUS | Status: DC | PRN
  Administered 2023-06-17: 200 mg via INTRAVENOUS

## 2023-06-17 MED ORDER — LIDOCAINE HCL (CARDIAC) PF 100 MG/5ML IV SOSY
PREFILLED_SYRINGE | INTRAVENOUS | Status: DC | PRN
  Administered 2023-06-17: 80 mg via INTRAVENOUS

## 2023-06-17 MED ORDER — OXYCODONE HCL 5 MG PO TABS
ORAL_TABLET | ORAL | Status: AC
  Administered 2023-06-17: 5 mg
  Filled 2023-06-17: qty 1

## 2023-06-17 SURGICAL SUPPLY — 79 items
ADH SKN CLS APL DERMABOND .7 (GAUZE/BANDAGES/DRESSINGS) ×2
AGENT HMST KT MTR STRL THRMB (HEMOSTASIS)
APL ESCP 34 STRL LF DISP (HEMOSTASIS)
APPLICATOR SURGIFLO ENDO (HEMOSTASIS) IMPLANT
BAG COUNTER SPONGE SURGICOUNT (BAG) IMPLANT
BAG LAPAROSCOPIC 12 15 PORT 16 (BASKET) ×1 IMPLANT
BAG RETRIEVAL 12/15 (BASKET) ×2
BAG SPNG CNTER NS LX DISP (BAG)
BLADE SURG SZ10 CARB STEEL (BLADE) IMPLANT
COVER BACK TABLE 60X90IN (DRAPES) ×2 IMPLANT
COVER TIP SHEARS 8 DVNC (MISCELLANEOUS) ×2 IMPLANT
DERMABOND ADVANCED .7 DNX12 (GAUZE/BANDAGES/DRESSINGS) ×2 IMPLANT
DRAPE ARM DVNC X/XI (DISPOSABLE) ×8 IMPLANT
DRAPE COLUMN DVNC XI (DISPOSABLE) ×2 IMPLANT
DRAPE SHEET LG 3/4 BI-LAMINATE (DRAPES) ×2 IMPLANT
DRAPE SURG IRRIG POUCH 19X23 (DRAPES) ×2 IMPLANT
DRIVER NDL MEGA SUTCUT DVNCXI (INSTRUMENTS) ×1 IMPLANT
DRIVER NDLE MEGA SUTCUT DVNCXI (INSTRUMENTS) ×2 IMPLANT
DRSG OPSITE POSTOP 4X6 (GAUZE/BANDAGES/DRESSINGS) IMPLANT
DRSG OPSITE POSTOP 4X8 (GAUZE/BANDAGES/DRESSINGS) IMPLANT
ELECT PENCIL ROCKER SW 15FT (MISCELLANEOUS) IMPLANT
ELECT REM PT RETURN 15FT ADLT (MISCELLANEOUS) ×2 IMPLANT
FORCEPS BPLR FENES DVNC XI (FORCEP) ×2 IMPLANT
FORCEPS PROGRASP DVNC XI (FORCEP) ×2 IMPLANT
GAUZE 4X4 16PLY ~~LOC~~+RFID DBL (SPONGE) ×4 IMPLANT
GLOVE BIO SURGEON STRL SZ 6 (GLOVE) ×8 IMPLANT
GLOVE BIO SURGEON STRL SZ 6.5 (GLOVE) ×2 IMPLANT
GOWN STRL REUS W/ TWL LRG LVL3 (GOWN DISPOSABLE) ×8 IMPLANT
GOWN STRL REUS W/TWL LRG LVL3 (GOWN DISPOSABLE) ×8
GRASPER SUT TROCAR 14GX15 (MISCELLANEOUS) ×1 IMPLANT
HOLDER FOLEY CATH W/STRAP (MISCELLANEOUS) IMPLANT
IRRIG SUCT STRYKERFLOW 2 WTIP (MISCELLANEOUS) ×2
IRRIGATION SUCT STRKRFLW 2 WTP (MISCELLANEOUS) ×2 IMPLANT
KIT PROCEDURE DVNC SI (MISCELLANEOUS) IMPLANT
KIT TURNOVER KIT A (KITS) IMPLANT
LIGASURE IMPACT 36 18CM CVD LR (INSTRUMENTS) IMPLANT
MANIPULATOR ADVINCU DEL 3.0 PL (MISCELLANEOUS) IMPLANT
MANIPULATOR ADVINCU DEL 3.5 PL (MISCELLANEOUS) IMPLANT
MANIPULATOR UTERINE 4.5 ZUMI (MISCELLANEOUS) ×1 IMPLANT
NDL HYPO 21X1.5 SAFETY (NEEDLE) ×1 IMPLANT
NDL SPNL 18GX3.5 QUINCKE PK (NEEDLE) IMPLANT
NEEDLE HYPO 21X1.5 SAFETY (NEEDLE) ×2 IMPLANT
NEEDLE SPNL 18GX3.5 QUINCKE PK (NEEDLE) IMPLANT
OBTURATOR OPTICAL STND 8 DVNC (TROCAR) ×2
OBTURATOR OPTICALSTD 8 DVNC (TROCAR) ×2 IMPLANT
PACK ROBOT GYN CUSTOM WL (TRAY / TRAY PROCEDURE) ×2 IMPLANT
PAD POSITIONING PINK XL (MISCELLANEOUS) ×2 IMPLANT
PORT ACCESS TROCAR AIRSEAL 12 (TROCAR) ×1 IMPLANT
RETRACTOR LAPSCP 12X46 CVD (ENDOMECHANICALS) ×1 IMPLANT
RTRCTR LAPSCP 12X46 CVD (ENDOMECHANICALS) ×2
SCISSORS MNPLR CVD DVNC XI (INSTRUMENTS) ×2 IMPLANT
SCRUB CHG 4% DYNA-HEX 4OZ (MISCELLANEOUS) ×2 IMPLANT
SEAL UNIV 5-12 XI (MISCELLANEOUS) ×8 IMPLANT
SET TRI-LUMEN FLTR TB AIRSEAL (TUBING) ×2 IMPLANT
SPIKE FLUID TRANSFER (MISCELLANEOUS) ×2 IMPLANT
SPONGE T-LAP 18X18 ~~LOC~~+RFID (SPONGE) IMPLANT
SURGIFLO W/THROMBIN 8M KIT (HEMOSTASIS) IMPLANT
SUT MNCRL AB 4-0 PS2 18 (SUTURE) IMPLANT
SUT PDS AB 1 TP1 96 (SUTURE) IMPLANT
SUT V-LOC 180 0-0 GS22 (SUTURE) IMPLANT
SUT VIC AB 0 CT1 27 (SUTURE)
SUT VIC AB 0 CT1 27XBRD ANTBC (SUTURE) IMPLANT
SUT VIC AB 2-0 CT1 27 (SUTURE)
SUT VIC AB 2-0 CT1 TAPERPNT 27 (SUTURE) IMPLANT
SUT VIC AB 4-0 PS2 18 (SUTURE) ×4 IMPLANT
SUT VICRYL 0 27 CT2 27 ABS (SUTURE) ×2 IMPLANT
SUT VLOC 180 0 9IN GS21 (SUTURE) ×1 IMPLANT
SYR 10ML LL (SYRINGE) IMPLANT
SYS BAG RETRIEVAL 10MM (BASKET)
SYS WOUND ALEXIS 18CM MED (MISCELLANEOUS)
SYSTEM BAG RETRIEVAL 10MM (BASKET) IMPLANT
SYSTEM WOUND ALEXIS 18CM MED (MISCELLANEOUS) IMPLANT
TOWEL OR NON WOVEN STRL DISP B (DISPOSABLE) IMPLANT
TRAP SPECIMEN MUCUS 40CC (MISCELLANEOUS) ×1 IMPLANT
TRAY FOLEY MTR SLVR 16FR STAT (SET/KITS/TRAYS/PACK) ×2 IMPLANT
TROCAR PORT AIRSEAL 5X120 (TROCAR) IMPLANT
UNDERPAD 30X36 HEAVY ABSORB (UNDERPADS AND DIAPERS) ×4 IMPLANT
WATER STERILE IRR 1000ML POUR (IV SOLUTION) ×1 IMPLANT
YANKAUER SUCT BULB TIP 10FT TU (MISCELLANEOUS) IMPLANT

## 2023-06-17 NOTE — Addendum Note (Signed)
Addendum  created 06/17/23 1604 by Florene Route, CRNA   Intraprocedure Event edited

## 2023-06-17 NOTE — Anesthesia Postprocedure Evaluation (Signed)
Anesthesia Post Note  Patient: Janet Woods  Procedure(s) Performed: XI ROBOTIC ASSISTED TOTAL HYSTERECTOMY WITH BILATERAL SALPINGO OOPHORECTOMY (Bilateral)     Patient location during evaluation: PACU Anesthesia Type: General Level of consciousness: awake and alert Pain management: pain level controlled Vital Signs Assessment: post-procedure vital signs reviewed and stable Respiratory status: spontaneous breathing, nonlabored ventilation, respiratory function stable and patient connected to nasal cannula oxygen Cardiovascular status: blood pressure returned to baseline and stable Postop Assessment: no apparent nausea or vomiting Anesthetic complications: no   No notable events documented.  Last Vitals:  Vitals:   06/17/23 1230 06/17/23 1235  BP: (!) 144/78 134/75  Pulse:  76  Resp: (!) 24   Temp:  36.5 C  SpO2: 100% 100%    Last Pain:  Vitals:   06/17/23 1235  TempSrc: Axillary  PainSc: 3                  Janet Woods Janet Woods

## 2023-06-17 NOTE — Interval H&P Note (Signed)
History and Physical Interval Note:  06/17/2023 6:44 AM  Janet Woods  has presented today for surgery, with the diagnosis of uteran mass.  The various methods of treatment have been discussed with the patient and family. After consideration of risks, benefits and other options for treatment, the patient has consented to  Procedure(s): XI ROBOTIC ASSISTED TOTAL HYSTERECTOMY WITH BILATERAL SALPINGO OOPHORECTOMY (Bilateral) POSSIBLE STAGING (N/A) as a surgical intervention.  The patient's history has been reviewed, patient examined, no change in status, stable for surgery.  I have reviewed the patient's chart and labs.  Questions were answered to the patient's satisfaction.     Carver Fila

## 2023-06-17 NOTE — Transfer of Care (Signed)
Immediate Anesthesia Transfer of Care Note  Patient: Janet Woods  Procedure(s) Performed: XI ROBOTIC ASSISTED TOTAL HYSTERECTOMY WITH BILATERAL SALPINGO OOPHORECTOMY (Bilateral)  Patient Location: PACU  Anesthesia Type:General  Level of Consciousness: awake, oriented, patient cooperative, and responds to stimulation  Airway & Oxygen Therapy: Patient Spontanous Breathing and Patient connected to face mask  Post-op Assessment: Report given to RN, Post -op Vital signs reviewed and stable, Post -op Vital signs reviewed and unstable, Anesthesiologist notified, Patient moving all extremities, Patient moving all extremities X 4, and Patient able to stick tongue midline  Post vital signs: Reviewed and stable  Last Vitals:  Vitals Value Taken Time  BP 140/116 06/17/23 1030  Temp 36.4 C 06/17/23 1026  Pulse 70 06/17/23 1035  Resp 9 06/17/23 1035  SpO2 100 % 06/17/23 1035  Vitals shown include unfiled device data.  Last Pain:  Vitals:   06/17/23 1026  TempSrc:   PainSc: 0-No pain         Complications: No notable events documented.

## 2023-06-17 NOTE — Discharge Instructions (Addendum)
AFTER SURGERY INSTRUCTIONS   Return to work: 4-6 weeks if applicable   Activity: 1. Be up and out of the bed during the day.  Take a nap if needed.  You may walk up steps but be careful and use the hand rail.  Stair climbing will tire you more than you think, you may need to stop part way and rest.    2. No lifting or straining for 6 weeks over 10 pounds. No pushing, pulling, straining for 6 weeks.   3. No driving for around 1 week(s).  Do not drive if you are taking narcotic pain medicine and make sure that your reaction time has returned.    4. You can shower as soon as the next day after surgery. Shower daily.  Use your regular soap and water (not directly on the incision) and pat your incision(s) dry afterwards; don't rub.  No tub baths or submerging your body in water until cleared by your surgeon. If you have the soap that was given to you by pre-surgical testing that was used before surgery, you do not need to use it afterwards because this can irritate your incisions.    5. No sexual activity and nothing in the vagina for 8-10 weeks.   6. You may experience a small amount of clear drainage from your incisions, which is normal.  If the drainage persists, increases, or changes color please call the office.   7. Do not use creams, lotions, or ointments such as neosporin on your incisions after surgery until advised by your surgeon because they can cause removal of the dermabond glue on your incisions.     8. You may experience vaginal spotting after surgery or when the stitches at the top of the vagina begin to dissolve.  The spotting is normal but if you experience heavy bleeding, call our office.   9. Take Tylenol or ibuprofen (OR NAPROXEN, DO NOT TAKE TOGETHER) first for pain if you are able to take these medications and only use narcotic pain medication for severe pain not relieved by the Tylenol or Ibuprofen (OR NAPROXEN).  Monitor your Tylenol intake to a max of 4,000 mg in a 24 hour  period. You can alternate these medications after surgery.   Diet: 1. Low sodium Heart Healthy Diet is recommended but you are cleared to resume your normal (before surgery) diet after your procedure.   2. It is safe to use a laxative, such as Miralax or Colace, if you have difficulty moving your bowels. You have been prescribed Sennakot-S to take at bedtime every evening after surgery to keep bowel movements regular and to prevent constipation.     Wound Care: 1. Keep clean and dry.  Shower daily.   Reasons to call the Doctor: Fever - Oral temperature greater than 100.4 degrees Fahrenheit Foul-smelling vaginal discharge Difficulty urinating Nausea and vomiting Increased pain at the site of the incision that is unrelieved with pain medicine. Difficulty breathing with or without chest pain New calf pain especially if only on one side Sudden, continuing increased vaginal bleeding with or without clots.   Contacts: For questions or concerns you should contact:   Dr. Eugene Garnet at [phone]   Warner Mccreedy, NP at [phone]   After Hours: call [phone] and have the GYN Oncologist paged/contacted (after 5 pm or on the weekends). You will speak with an after hours RN and let he or she know you have had surgery.   Messages sent via mychart are for  non-urgent matters and are not responded to after hours so for urgent needs, please call the after hours number.

## 2023-06-17 NOTE — Op Note (Signed)
OPERATIVE NOTE  Pre-operative Diagnosis: Thickened endometrium (redeveloped quickly after prior D&C procedure),   Post-operative Diagnosis: same, significant pelvic adiposity, narrow pelvis, benign endometrial polyp and adenomyosis on frozen section  Operation: Robotic-assisted laparoscopic total hysterectomy with bilateral salpingo-oophorectomy Extreme morbid obesity requiring additional OR personnel for positioning and retraction. Obesity made retroperitoneal visualization limited and increased the complexity of the case and necessitated additional instrumentation for retraction. Obesity related complexity increased the duration of the procedure by 75 minutes.   Surgeon: Eugene Garnet MD  Assistant Surgeon: Antionette Char MD (an MD assistant was necessary for tissue manipulation, management of robotic instrumentation, retraction and positioning due to the complexity of the case and hospital policies).   Anesthesia: GET  Urine Output: 75 cc  Operative Findings: On EUA, mildly enlarged mobile uterus. On intra-abdominal entry, normal upper abdominal survey. Normal appearing omentum, small and large bowel. Short small bowel mesentery limited ability of bowel to be placed in the upper abdomen. Uterus 8-10 cm and bulbous but normal appearing. Normal appearing adnexa. Visibility quite limited due to adiposity within the pelvis, large and redundant colon. No ascites.   Estimated Blood Loss:  50 cc      Total IV Fluids: see I&O flowsheet         Specimens: uterus, cervix, bilateral tubes and ovaries         Complications:  None apparent; patient tolerated the procedure well.         Disposition: PACU - hemodynamically stable.  Procedure Details  The patient was seen in the Holding Room. The risks, benefits, complications, treatment options, and expected outcomes were discussed with the patient.  The patient concurred with the proposed plan, giving informed consent.  The site of surgery  properly noted/marked. The patient was identified as Janet Woods and the procedure verified as a Robotic-assisted hysterectomy with bilateral salpingo oophorectomy.   After induction of anesthesia, the patient was draped and prepped in the usual sterile manner. Patient was placed in supine position after anesthesia and draped and prepped in the usual sterile manner as follows: Her arms were tucked to her side with all appropriate precautions.  The patient was secured to the bed using padding and tape across her chest.  The patient was placed in the semi-lithotomy position in [place] stirrups.  The perineum and vagina were prepped with Betadine. The patient's abdomen was prepped with ChloraPrep and then she was draped after the prep had been allowed to dry for 3 minutes.  A Time Out was held and the above information confirmed.  The urethra was prepped with Betadine. Foley catheter was placed.  A sterile speculum was placed in the vagina.  The cervix was grasped with a single-tooth tenaculum. The cervix was dilated with Shawnie Pons dilators.  The ZUMI uterine manipulator with a medium colpotomizer ring was placed without difficulty.  A pneum occluder balloon was placed over the manipulator.  OG tube placement was confirmed and to suction.   Next, a 10 mm skin incision was made 1 cm below the subcostal margin in the midclavicular line.  The 5 mm Optiview port and scope was used for direct entry.  Opening pressure was under 10 mm CO2.  The abdomen was insufflated and the findings were noted as above.   At this point and all points during the procedure, the patient's intra-abdominal pressure did not exceed 15 mmHg. Next, an 8 mm skin incision was made superior to the umbilicus and a right and left port were placed about  8 cm lateral to the robot port on the right and left side.  A fourth arm was placed on the right.  The 5 mm assist trocar was exchanged for a 12 mm port. All ports were placed under direct  visualization.  The patient was placed in steep Trendelenburg.  Bowel was folded away into the upper abdomen.  The robot was docked in the normal manner.  Small and large bowel retractions were accomplished with the paddle and the fourth arm during the surgery. The right and left peritoneum were opened parallel to the IP ligament to open the retroperitoneal spaces bilaterally. The round ligaments were transected. The ureter was noted to be on the medial leaf of the broad ligament.  The peritoneum above the ureter was incised and stretched and the infundibulopelvic ligament was skeletonized, cauterized and cut.    The posterior peritoneum was taken down to the level of the KOH ring.  The anterior peritoneum was also taken down.  The bladder flap was created to the level of the KOH ring.  The uterine artery on the right side was skeletonized, cauterized and cut in the normal manner.  A similar procedure was performed on the left.  The colpotomy was made and the uterus, cervix, bilateral ovaries and tubes were amputated and delivered through the vagina.  Pedicles were inspected and excellent hemostasis was achieved.    The colpotomy at the vaginal cuff was closed with 0 Vicryl on a CT1 needle with a figure of eight at each apex and 0 V-Loc to close the midportion of the cuff in a running manner.  Irrigation was used and excellent hemostasis was achieved.  At this point in the procedure was completed.  Robotic instruments were removed under direct visulaization.  The robot was undocked. The fascia at the 10-12 mm port was closed with 0 Vicryl using a PMI fascial closure device under direct visualization.  The subcuticular tissue was closed with 4-0 Vicryl and the skin was closed with 4-0 Monocryl in a subcuticular manner.  Dermabond was applied.    The vagina was swabbed with  minimal bleeding noted. Foley catheter was removed.  All sponge, lap and needle counts were correct x  3.   The patient was transferred  to the recovery room in stable condition.  Eugene Garnet, MD

## 2023-06-17 NOTE — Anesthesia Procedure Notes (Signed)
Procedure Name: Intubation Date/Time: 06/17/2023 7:49 AM  Performed by: Rise Patience, CRNAPre-anesthesia Checklist: Patient identified, Emergency Drugs available, Suction available and Patient being monitored Patient Re-evaluated:Patient Re-evaluated prior to induction Oxygen Delivery Method: Circle system utilized Preoxygenation: Pre-oxygenation with 100% oxygen Induction Type: IV induction Ventilation: Mask ventilation without difficulty Laryngoscope Size: 3 and Mac Grade View: Grade II Tube type: Oral Number of attempts: 2 Airway Equipment and Method: Stylet Placement Confirmation: ETT inserted through vocal cords under direct vision, positive ETCO2 and breath sounds checked- equal and bilateral Secured at: 22 cm Tube secured with: Tape Dental Injury: Teeth and Oropharynx as per pre-operative assessment

## 2023-06-18 ENCOUNTER — Encounter (HOSPITAL_COMMUNITY): Payer: Self-pay | Admitting: Gynecologic Oncology

## 2023-06-18 ENCOUNTER — Telehealth: Payer: Self-pay

## 2023-06-18 NOTE — Telephone Encounter (Signed)
Spoke with Janet Woods this morning. She states she is eating, drinking and urinating well. She has not had a BM yet but is passing gas. She is taking senokot as prescribed and encouraged her to drink plenty of water. She denies fever or chills. Incisions are dry and intact. She rates her pain 2/10. Her pain is controlled with Tramadol only once last night    Instructed to call office with any fever, chills, purulent drainage, uncontrolled pain or any other questions or concerns. Patient verbalizes understanding.   Pt aware of post op appointments as well as the office number [phone] and after hours number [phone] to call if she has any questions or concerns

## 2023-06-24 ENCOUNTER — Telehealth: Payer: Self-pay | Admitting: Gynecologic Oncology

## 2023-06-24 NOTE — Telephone Encounter (Signed)
Called patient - doing well. Discussed pathology. She is very happy with this news.  Eugene Garnet MD Gynecologic Oncology

## 2023-06-29 ENCOUNTER — Ambulatory Visit
Admission: RE | Admit: 2023-06-29 | Discharge: 2023-06-29 | Disposition: A | Discharge status: Home / Self Care | Payer: Medicare HMO | Source: Ambulatory Visit | Attending: Optometry | Admitting: Optometry

## 2023-06-29 DIAGNOSIS — H50811 Duane's syndrome, right eye: Secondary | ICD-10-CM

## 2023-06-29 DIAGNOSIS — H5589 Other irregular eye movements: Secondary | ICD-10-CM

## 2023-06-29 NOTE — Progress Notes (Unsigned)
    SUBJECTIVE:   CHIEF COMPLAINT / HPI:   T2DM Taking metformin daily. Last A1c last month of 7.   HTN Olmesartan added to hydrochlorothiazide at last visit on 7/22.   PERTINENT  PMH / PSH: ***  OBJECTIVE:   There were no vitals taken for this visit. ***  General: NAD, pleasant, able to participate in exam Cardiac: RRR, no murmurs. Respiratory: CTAB, normal effort, No wheezes, rales or rhonchi Abdomen: Bowel sounds present, nontender, nondistended, no hepatosplenomegaly. Extremities: no edema or cyanosis. Skin: warm and dry, no rashes noted Neuro: alert, no obvious focal deficits Psych: Normal affect and mood  ASSESSMENT/PLAN:   No problem-specific Assessment & Plan notes found for this encounter.     Dr. Erick Alley, DO [facility]    {    This will disappear when note is signed, click to select method of visit    :1}

## 2023-06-30 ENCOUNTER — Encounter: Payer: Self-pay | Admitting: Student

## 2023-06-30 ENCOUNTER — Ambulatory Visit: Payer: Medicare HMO | Admitting: Student

## 2023-06-30 VITALS — BP 138/90 | HR 67 | Ht 67.0 in | Wt 249.0 lb

## 2023-06-30 DIAGNOSIS — Z13820 Encounter for screening for osteoporosis: Secondary | ICD-10-CM | POA: Diagnosis not present

## 2023-06-30 DIAGNOSIS — I10 Essential (primary) hypertension: Secondary | ICD-10-CM | POA: Diagnosis not present

## 2023-06-30 DIAGNOSIS — E119 Type 2 diabetes mellitus without complications: Secondary | ICD-10-CM | POA: Diagnosis not present

## 2023-06-30 DIAGNOSIS — Z23 Encounter for immunization: Secondary | ICD-10-CM

## 2023-06-30 MED ORDER — METFORMIN HCL ER 500 MG PO TB24
500.0000 mg | ORAL_TABLET | Freq: Two times a day (BID) | ORAL | 1 refills | Status: DC
Start: 2023-06-30 — End: 2023-12-23

## 2023-06-30 NOTE — Assessment & Plan Note (Signed)
Patient advised she does not need to check blood sugars in order to take metformin.  Discussed Ozempic, patient declined and would prefer to take metformin daily. -Metformin 500 mg daily with breakfast for 1 week, if she tolerates this well, increase to twice daily -Return for A1c in 3 months

## 2023-06-30 NOTE — Patient Instructions (Addendum)
It was great to see you! Thank you for allowing me to participate in your care!  I recommend that you always bring your medications to each appointment as this makes it easy to ensure you are on the correct medications and helps [place] not miss when refills are needed.  Our plans for today:  - check blood pressure at home daily. If consistently above 140/90, return. Please record blood pressure and bring to next apt.  - Take metformin 500 mg daily with breakfast for one week. If you tolerate it well, increase to twice daily.  - Return in 3 months to check your A1c -[facility] Address: [address], [place], [place] [address] Phone: [phone] - you received you pneumonia vaccine today    Take care and seek immediate care sooner if you develop any concerns.   Dr. Erick Alley, DO [facility]

## 2023-06-30 NOTE — Assessment & Plan Note (Signed)
Close to goal in office today, reading controlled with home BP cuff.  -Continue hydrochlorothiazide -Check BP daily at home and record readings -If consistently above 140/90 return sooner, will consider starting olmesartan if needed -Otherwise, we will recheck BP in 3 months when she returns for A1c

## 2023-07-02 ENCOUNTER — Encounter: Payer: Self-pay | Admitting: Student

## 2023-07-03 ENCOUNTER — Other Ambulatory Visit: Payer: Self-pay | Admitting: Student

## 2023-07-03 DIAGNOSIS — I10 Essential (primary) hypertension: Secondary | ICD-10-CM

## 2023-07-07 ENCOUNTER — Ambulatory Visit (INDEPENDENT_AMBULATORY_CARE_PROVIDER_SITE_OTHER): Payer: Medicare HMO

## 2023-07-07 VITALS — Ht 67.0 in | Wt 249.0 lb

## 2023-07-07 DIAGNOSIS — Z0000 Encounter for general adult medical examination without abnormal findings: Secondary | ICD-10-CM | POA: Diagnosis not present

## 2023-07-07 NOTE — Progress Notes (Signed)
Subjective:   Janet Woods is a 67 y.o. female who presents for Medicare Annual (Subsequent) preventive examination.  Visit Complete: Virtual  I connected with  [name] on 07/07/23 by a audio enabled telemedicine application and verified that I am speaking with the correct person using two identifiers.  Patient Location: Home  Provider Location: Home Office  I discussed the limitations of evaluation and management by telemedicine. The patient expressed understanding and agreed to proceed.  Vital Signs: Because this visit was a virtual/telehealth visit, some criteria may be missing or patient reported. Any vitals not documented were not able to be obtained and vitals that have been documented are patient reported.   Review of Systems     Cardiac Risk Factors include: advanced age (>61men, >62 women);diabetes mellitus;hypertension;dyslipidemia     Objective:    Today's Vitals   07/07/23 1324  Weight: 249 lb (112.9 kg)  Height: 5\' 7"  (1.702 m)   Body mass index is 39 kg/m.     07/07/2023    1:30 PM 06/17/2023    5:58 AM 06/08/2023    8:10 AM 05/25/2023    8:10 AM 04/20/2023    9:53 AM 01/14/2023    7:50 AM 01/06/2023   12:26 PM  Advanced Directives  Does Patient Have a Medical Advance Directive? No No No No No No No  Would patient like information on creating a medical advance directive? Yes (MAU/Ambulatory/Procedural Areas - Information given) No - Patient declined No - Patient declined Yes (MAU/Ambulatory/Procedural Areas - Information given) No - Patient declined Yes (MAU/Ambulatory/Procedural Areas - Information given) No - Patient declined    Current Medications (verified) Outpatient Encounter Medications as of 07/07/2023  Medication Sig   cetirizine (ZYRTEC) 10 MG tablet TAKE 1 TABLET(10 MG) BY MOUTH DAILY (Patient taking differently: Take 10 mg by mouth daily as needed for allergies.)   fluticasone (FLONASE) 50 MCG/ACT nasal spray Place 1 spray into both  nostrils daily as needed for allergies or rhinitis.   hydrochlorothiazide (HYDRODIURIL) 25 MG tablet TAKE 1 TABLET(25 MG) BY MOUTH DAILY   lidocaine (LIDODERM) 5 % Place 1 patch onto the skin daily. Remove & Discard patch within 12 hours or as directed by MD (Patient taking differently: Place 1 patch onto the skin daily as needed (back/shoulder pain). Remove & Discard patch within 12 hours or as directed by MD)   MAXITROL 3.5-10000-0.1 OINT Place 1 Application into both eyes 2 (two) times daily.   metFORMIN (GLUCOPHAGE-XR) 500 MG 24 hr tablet Take 1 tablet (500 mg total) by mouth 2 (two) times daily with a meal.   Olopatadine HCl 0.2 % SOLN Apply 1 drop to eye daily. (Patient not taking: Reported on 05/19/2023)   Polyethyl Glycol-Propyl Glycol (LUBRICATING EYE DROPS OP) Place 1 drop into both eyes daily as needed (dry eyes).   senna-docusate (SENOKOT-S) 8.6-50 MG tablet Take 2 tablets by mouth at bedtime. For AFTER surgery, do not take if having diarrhea (Patient not taking: Reported on 06/08/2023)   traMADol (ULTRAM) 50 MG tablet Take 1 tablet (50 mg total) by mouth every 6 (six) hours as needed for severe pain. For AFTER surgery only, do not take and drive (Patient not taking: Reported on 06/08/2023)   Vitamin D, Ergocalciferol, (DRISDOL) 1.25 MG (50000 UNIT) CAPS capsule Take 1 capsule (50,000 Units total) by mouth every 7 (seven) days.   No facility-administered encounter medications on file as of 07/07/2023.    Allergies (verified) Patient has no known allergies.  History: Past Medical History:  Diagnosis Date   Arthritis    Carpal tunnel syndrome on both sides    Full dentures    Hypertension    Type 2 diabetes mellitus (HCC)    followed by pcp    (01-12-2023 per pt pcp note in epic DM2 ,however , per pt does have diabetes anymore just takes metformen to lose weight)   Uterine mass    Past Surgical History:  Procedure Laterality Date   COLONOSCOPY WITH PROPOFOL  04/22/2021   dr Jolinda Croak    DILATATION & CURETTAGE/HYSTEROSCOPY WITH MYOSURE N/A 01/14/2023   Procedure: DILATATION & CURETTAGE/HYSTEROSCOPY WITH MYOSURE;  Surgeon: Carver Fila, MD;  Location: [facility];  Service: Gynecology;  Laterality: N/A;   MULTIPLE TOOTH EXTRACTIONS     in [place]   ROBOTIC ASSISTED TOTAL HYSTERECTOMY WITH BILATERAL SALPINGO OOPHERECTOMY Bilateral 06/17/2023   Procedure: XI ROBOTIC ASSISTED TOTAL HYSTERECTOMY WITH BILATERAL SALPINGO OOPHORECTOMY;  Surgeon: Carver Fila, MD;  Location: [facility];  Service: Gynecology;  Laterality: Bilateral;   Family History  Problem Relation Age of Onset   Heart disease Mother 30       MI   Cancer Sister 27       lung cancer   Stroke Maternal Grandmother    Heart disease Maternal Grandfather    Stroke Daughter    Diabetes Neg Hx    Colon cancer Neg Hx    Breast cancer Neg Hx    Ovarian cancer Neg Hx    Endometrial cancer Neg Hx    Pancreatic cancer Neg Hx    Prostate cancer Neg Hx    Social History   Socioeconomic History   Marital status: Single    Spouse name: Not on file   Number of children: Not on file   Years of education: Not on file   Highest education level: Not on file  Occupational History   Occupation: retired  Tobacco Use   Smoking status: Never   Smokeless tobacco: Never  Vaping Use   Vaping status: Never Used  Substance and Sexual Activity   Alcohol use: No   Drug use: Never   Sexual activity: Not on file  Other Topics Concern   Not on file  Social History Narrative   Lives with daughter with CP Janet Woods age 43).  No long term relationship.  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   Social Determinants of Health   Financial Resource Strain: Low Risk  (07/07/2023)   Overall Financial Resource Strain (CARDIA)    Difficulty of Paying Living Expenses: Not hard at all  Food Insecurity: No Food Insecurity (07/07/2023)   Hunger Vital Sign    Worried About Running Out of Food in the Last Year:  Never true    Ran Out of Food in the Last Year: Never true  Transportation Needs: No Transportation Needs (07/07/2023)   PRAPARE - Administrator, Civil Service (Medical): No    Lack of Transportation (Non-Medical): No  Physical Activity: Insufficiently Active (07/07/2023)   Exercise Vital Sign    Days of Exercise per Week: 3 days    Minutes of Exercise per Session: 30 min  Stress: No Stress Concern Present (07/07/2023)   [employer] of Occupational Health - Occupational Stress Questionnaire    Feeling of Stress : Not at all  Social Connections: Moderately Isolated (07/07/2023)   Social Connection and Isolation Panel [NHANES]    Frequency of Communication with Friends  and Family: More than three times a week    Frequency of Social Gatherings with Friends and Family: Three times a week    Attends Religious Services: More than 4 times per year    Active Member of Clubs or Organizations: No    Attends Banker Meetings: Never    Marital Status: Never married    Tobacco Counseling Counseling given: Not Answered   Clinical Intake:  Pre-visit preparation completed: Yes  Pain : No/denies pain  Diabetes: Yes CBG done?: No Did pt. bring in CBG monitor from home?: No  How often do you need to have someone help you when you read instructions, pamphlets, or other written materials from your doctor or pharmacy?: 1 - Never  Interpreter Needed?: No  Information entered by :: Kandis Fantasia LPN   Activities of Daily Living    07/07/2023    1:30 PM 06/17/2023    5:40 AM  In your present state of health, do you have any difficulty performing the following activities:  Hearing? 0 0  Vision? 0 0  Difficulty concentrating or making decisions? 0 0  Walking or climbing stairs? 0   Dressing or bathing? 0 0  Doing errands, shopping? 0   Preparing Food and eating ? N   Using the Toilet? N   In the past six months, have you accidently leaked urine? N   Do you  have problems with loss of bowel control? N   Managing your Medications? N   Managing your Finances? N   Housekeeping or managing your Housekeeping? N     Patient Care Team: Erick Alley, DO as PCP - General (Family Medicine)  Indicate any recent Medical Services you may have received from other than [facility] providers in the past year (date may be approximate).     Assessment:   This is a routine wellness examination for Noor.  Hearing/Vision screen Hearing Screening - Comments:: Denies hearing difficulties   Vision Screening - Comments:: Wears rx glasses - up to date with routine eye exams with Dr. Elmer Picker   Dietary issues and exercise activities discussed:     Goals Addressed             This Visit's Progress    Remain active and independent        Depression Screen    07/07/2023    1:28 PM 06/30/2023    8:19 AM 06/08/2023    8:13 AM 04/20/2023    9:53 AM 10/23/2022    8:17 AM 09/30/2022    8:29 AM 10/24/2021    8:31 AM  PHQ 2/9 Scores  PHQ - 2 Score 0 0 0 0 0 0 0  PHQ- 9 Score 0 0 0  0 0 0    Fall Risk    07/07/2023    1:30 PM 04/20/2023    9:53 AM 04/10/2021   11:44 AM 10/03/2019    1:37 PM 05/21/2017    9:05 AM  Fall Risk   Falls in the past year? 0 0 0 0 No  Number falls in past yr: 0  0 0   Injury with Fall? 0  0    Risk for fall due to : No Fall Risks      Follow up Falls prevention discussed;Education provided;Falls evaluation completed   Falls evaluation completed     MEDICARE RISK AT HOME: Medicare Risk at Home Any stairs in or around the home?: No If so, are there any without handrails?: No Home  free of loose throw rugs in walkways, pet beds, electrical cords, etc?: Yes Adequate lighting in your home to reduce risk of falls?: Yes Life alert?: No Use of a cane, walker or w/c?: No Grab bars in the bathroom?: Yes Shower chair or bench in shower?: No Elevated toilet seat or a handicapped toilet?: No  TIMED UP AND GO:  Was the test performed?  No     Cognitive Function:        07/07/2023    1:30 PM  6CIT Screen  What Year? 0 points  What month? 0 points  What time? 0 points  Count back from 20 0 points  Months in reverse 0 points  Repeat phrase 0 points  Total Score 0 points    Immunizations Immunization History  Administered Date(s) Administered   COVID-19, mRNA, vaccine(Comirnaty)12 years and older 09/30/2022   Influenza Whole 09/11/2011   Influenza,inj,Quad PF,6+ Mos 09/11/2016, 08/01/2019, 09/30/2022   Influenza-Unspecified 08/02/2013, 07/19/2014, 10/17/2018, 08/27/2020   PFIZER(Purple Top)SARS-COV-2 Vaccination 04/09/2020, 04/30/2020   PNEUMOCOCCAL CONJUGATE-20 06/30/2023   Pfizer Covid-19 Vaccine Bivalent Booster 77yrs & up 09/27/2021   Td 06/29/2009   Tdap 09/30/2022    TDAP status: Up to date  Flu Vaccine status: Due, Education has been provided regarding the importance of this vaccine. Advised may receive this vaccine at local pharmacy or Health Dept. Aware to provide a copy of the vaccination record if obtained from local pharmacy or Health Dept. Verbalized acceptance and understanding.  Pneumococcal vaccine status: Up to date  Covid-19 vaccine status: Information provided on how to obtain vaccines.   Qualifies for Shingles Vaccine? Yes   Zostavax completed No   Shingrix Completed?: No.    Education has been provided regarding the importance of this vaccine. Patient has been advised to call insurance company to determine out of pocket expense if they have not yet received this vaccine. Advised may also receive vaccine at local pharmacy or Health Dept. Verbalized acceptance and understanding.  Screening Tests Health Maintenance  Topic Date Due   COVID-19 Vaccine (5 - 2023-24 season) 01/29/2023   DEXA SCAN  Never done   INFLUENZA VACCINE  06/18/2023   Zoster Vaccines- Shingrix (1 of 2) 09/30/2023 (Originally 03/18/2008)   HEMOGLOBIN A1C  11/25/2023   Colonoscopy  04/22/2024   OPHTHALMOLOGY EXAM   05/26/2024   Diabetic kidney evaluation - Urine ACR  06/07/2024   Diabetic kidney evaluation - eGFR measurement  06/14/2024   FOOT EXAM  06/29/2024   Medicare Annual Wellness (AWV)  07/06/2024   PAP SMEAR-Modifier  10/24/2024   MAMMOGRAM  10/29/2024   DTaP/Tdap/Td (3 - Td or Tdap) 09/30/2032   Pneumonia Vaccine 63+ Years old  Completed   Hepatitis C Screening  Completed   HIV Screening  Completed   HPV VACCINES  Aged Out    Health Maintenance  Health Maintenance Due  Topic Date Due   COVID-19 Vaccine (5 - 2023-24 season) 01/29/2023   DEXA SCAN  Never done   INFLUENZA VACCINE  06/18/2023    Colorectal cancer screening: Type of screening: Colonoscopy. Completed 04/22/21. Repeat every 3 years  Mammogram status: Completed 10/29/22. Repeat every year  Bone Density status: Ordered and scheduled 01/04/24. Pt provided with contact info and advised to call to schedule appt.  Lung Cancer Screening: (Low Dose CT Chest recommended if Age 73-80 years, 20 pack-year currently smoking OR have quit w/in 15years.) does not qualify.   Lung Cancer Screening Referral: n/a  Additional Screening:  Hepatitis C Screening: does  qualify; Completed 03/16/13  Vision Screening: Recommended annual ophthalmology exams for early detection of glaucoma and other disorders of the eye. Is the patient up to date with their annual eye exam?  Yes  Who is the provider or what is the name of the office in which the patient attends annual eye exams? [facility] If pt is not established with a provider, would they like to be referred to a provider to establish care? No .   Dental Screening: Recommended annual dental exams for proper oral hygiene  Diabetic Foot Exam: Diabetic Foot Exam: Completed 06/30/23  Community Resource Referral / Chronic Care Management: CRR required this visit?  No   CCM required this visit?  No     Plan:     I have personally reviewed and noted the following in the patient's chart:    Medical and social history Use of alcohol, tobacco or illicit drugs  Current medications and supplements including opioid prescriptions. Patient is not currently taking opioid prescriptions. Functional ability and status Nutritional status Physical activity Advanced directives List of other physicians Hospitalizations, surgeries, and ER visits in previous 12 months Vitals Screenings to include cognitive, depression, and falls Referrals and appointments  In addition, I have reviewed and discussed with patient certain preventive protocols, quality metrics, and best practice recommendations. A written personalized care plan for preventive services as well as general preventive health recommendations were provided to patient.     Kandis Fantasia [place], [place]   2/44/0102   After Visit Summary: (Mail) Due to this being a telephonic visit, the after visit summary with patients personalized plan was offered to patient via mail   Nurse Notes: No concerns at this time

## 2023-07-07 NOTE — Patient Instructions (Signed)
Ms. Linney , Thank you for taking time to come for your Medicare Wellness Visit. I appreciate your ongoing commitment to your health goals. Please review the following plan we discussed and let me know if I can assist you in the future.   Referrals/Orders/Follow-Ups/Clinician Recommendations: Aim for 30 minutes of exercise or brisk walking, 6-8 glasses of water, and 5 servings of fruits and vegetables each day.  This is a list of the screening recommended for you and due dates:  Health Maintenance  Topic Date Due   COVID-19 Vaccine (5 - 2023-24 season) 01/29/2023   DEXA scan (bone density measurement)  Never done   Flu Shot  06/18/2023   Zoster (Shingles) Vaccine (1 of 2) 09/30/2023*   Hemoglobin A1C  11/25/2023   Colon Cancer Screening  04/22/2024   Eye exam for diabetics  05/26/2024   Yearly kidney health urinalysis for diabetes  06/07/2024   Yearly kidney function blood test for diabetes  06/14/2024   Complete foot exam   06/29/2024   Medicare Annual Wellness Visit  07/06/2024   Pap Smear  10/24/2024   Mammogram  10/29/2024   DTaP/Tdap/Td vaccine (3 - Td or Tdap) 09/30/2032   Pneumonia Vaccine  Completed   Hepatitis C Screening  Completed   HIV Screening  Completed   HPV Vaccine  Aged Out  *Topic was postponed. The date shown is not the original due date.    Advanced directives: (ACP Link)Information on Advanced Care Planning can be found at [facility] Advance Health Care Directives Advance Health Care Directives ([email or web address]   Next Medicare Annual Wellness Visit scheduled for next year: Yes  Preventive Care 65 Years and Older, Female Preventive care refers to lifestyle choices and visits with your health care provider that can promote health and wellness. What does preventive care include? A yearly physical exam. This is also called an annual well check. Dental exams once or twice a year. Routine eye exams. Ask your health care provider how often you  should have your eyes checked. Personal lifestyle choices, including: Daily care of your teeth and gums. Regular physical activity. Eating a healthy diet. Avoiding tobacco and drug use. Limiting alcohol use. Practicing safe sex. Taking low-dose aspirin every day. Taking vitamin and mineral supplements as recommended by your health care provider. What happens during an annual well check? The services and screenings done by your health care provider during your annual well check will depend on your age, overall health, lifestyle risk factors, and family history of disease. Counseling  Your health care provider may ask you questions about your: Alcohol use. Tobacco use. Drug use. Emotional well-being. Home and relationship well-being. Sexual activity. Eating habits. History of falls. Memory and ability to understand (cognition). Work and work Astronomer. Reproductive health. Screening  You may have the following tests or measurements: Height, weight, and BMI. Blood pressure. Lipid and cholesterol levels. These may be checked every 5 years, or more frequently if you are over 89 years old. Skin check. Lung cancer screening. You may have this screening every year starting at age 31 if you have a 30-pack-year history of smoking and currently smoke or have quit within the past 15 years. Fecal occult blood test (FOBT) of the stool. You may have this test every year starting at age 54. Flexible sigmoidoscopy or colonoscopy. You may have a sigmoidoscopy every 5 years or a colonoscopy every 10 years starting at age 39. Hepatitis C blood test. Hepatitis B blood test. Sexually transmitted  disease (STD) testing. Diabetes screening. This is done by checking your blood sugar (glucose) after you have not eaten for a while (fasting). You may have this done every 1-3 years. Bone density scan. This is done to screen for osteoporosis. You may have this done starting at age 44. Mammogram. This may  be done every 1-2 years. Talk to your health care provider about how often you should have regular mammograms. Talk with your health care provider about your test results, treatment options, and if necessary, the need for more tests. Vaccines  Your health care provider may recommend certain vaccines, such as: Influenza vaccine. This is recommended every year. Tetanus, diphtheria, and acellular pertussis (Tdap, Td) vaccine. You may need a Td booster every 10 years. Zoster vaccine. You may need this after age 40. Pneumococcal 13-valent conjugate (PCV13) vaccine. One dose is recommended after age 53. Pneumococcal polysaccharide (PPSV23) vaccine. One dose is recommended after age 81. Talk to your health care provider about which screenings and vaccines you need and how often you need them. This information is not intended to replace advice given to you by your health care provider. Make sure you discuss any questions you have with your health care provider. Document Released: 11/30/2015 Document Revised: 07/23/2016 Document Reviewed: 09/04/2015 Elsevier Interactive Patient Education  2017 [employer].  Fall Prevention in the Home Falls can cause injuries. They can happen to people of all ages. There are many things you can do to make your home safe and to help prevent falls. What can I do on the outside of my home? Regularly fix the edges of walkways and driveways and fix any cracks. Remove anything that might make you trip as you walk through a door, such as a raised step or threshold. Trim any bushes or trees on the path to your home. Use bright outdoor lighting. Clear any walking paths of anything that might make someone trip, such as rocks or tools. Regularly check to see if handrails are loose or broken. Make sure that both sides of any steps have handrails. Any raised decks and porches should have guardrails on the edges. Have any leaves, snow, or ice cleared regularly. Use sand or salt  on walking paths during winter. Clean up any spills in your garage right away. This includes oil or grease spills. What can I do in the bathroom? Use night lights. Install grab bars by the toilet and in the tub and shower. Do not use towel bars as grab bars. Use non-skid mats or decals in the tub or shower. If you need to sit down in the shower, use a plastic, non-slip stool. Keep the floor dry. Clean up any water that spills on the floor as soon as it happens. Remove soap buildup in the tub or shower regularly. Attach bath mats securely with double-sided non-slip rug tape. Do not have throw rugs and other things on the floor that can make you trip. What can I do in the bedroom? Use night lights. Make sure that you have a light by your bed that is easy to reach. Do not use any sheets or blankets that are too big for your bed. They should not hang down onto the floor. Have a firm chair that has side arms. You can use this for support while you get dressed. Do not have throw rugs and other things on the floor that can make you trip. What can I do in the kitchen? Clean up any spills right away. Avoid walking  on wet floors. Keep items that you use a lot in easy-to-reach places. If you need to reach something above you, use a strong step stool that has a grab bar. Keep electrical cords out of the way. Do not use floor polish or wax that makes floors slippery. If you must use wax, use non-skid floor wax. Do not have throw rugs and other things on the floor that can make you trip. What can I do with my stairs? Do not leave any items on the stairs. Make sure that there are handrails on both sides of the stairs and use them. Fix handrails that are broken or loose. Make sure that handrails are as long as the stairways. Check any carpeting to make sure that it is firmly attached to the stairs. Fix any carpet that is loose or worn. Avoid having throw rugs at the top or bottom of the stairs. If you  do have throw rugs, attach them to the floor with carpet tape. Make sure that you have a light switch at the top of the stairs and the bottom of the stairs. If you do not have them, ask someone to add them for you. What else can I do to help prevent falls? Wear shoes that: Do not have high heels. Have rubber bottoms. Are comfortable and fit you well. Are closed at the toe. Do not wear sandals. If you use a stepladder: Make sure that it is fully opened. Do not climb a closed stepladder. Make sure that both sides of the stepladder are locked into place. Ask someone to hold it for you, if possible. Clearly mark and make sure that you can see: Any grab bars or handrails. First and last steps. Where the edge of each step is. Use tools that help you move around (mobility aids) if they are needed. These include: Canes. Walkers. Scooters. Crutches. Turn on the lights when you go into a dark area. Replace any light bulbs as soon as they burn out. Set up your furniture so you have a clear path. Avoid moving your furniture around. If any of your floors are uneven, fix them. If there are any pets around you, be aware of where they are. Review your medicines with your doctor. Some medicines can make you feel dizzy. This can increase your chance of falling. Ask your doctor what other things that you can do to help prevent falls. This information is not intended to replace advice given to you by your health care provider. Make sure you discuss any questions you have with your health care provider. Document Released: 08/30/2009 Document Revised: 04/10/2016 Document Reviewed: 12/08/2014 Elsevier Interactive Patient Education  2017 [employer].

## 2023-07-11 ENCOUNTER — Other Ambulatory Visit: Payer: Self-pay | Admitting: Student

## 2023-07-11 DIAGNOSIS — I10 Essential (primary) hypertension: Secondary | ICD-10-CM

## 2023-07-15 NOTE — Progress Notes (Unsigned)
Gynecologic Oncology Return Clinic Visit  07/16/23  Reason for Visit: 07/16/23  Treatment History: The patient saw her PCP in December. Given abdominal bloating, imaging was discussed. CT was ordered but not authorized by insurance. Pelvic ultrasound was ordered.   12/24/2022: Pelvic ultrasound shows complex heterogenous mass of the upper uterine segment endometrial canal measuring 3.3 cm, containing solid and cystic components.  Nonvisualization of the right ovary.  Unremarkable left ovary.  No pelvic free fluid.   The patient reports that after she retired in 2019, she gained some weight (approximately 25 pounds).  This was related to increased eating, eating fried foods, and drinking more soda.  She reports always having a "big" stomach, but more recently has developed some abdominal bloating.   She denies any abdominal or pelvic pain.  She denies vaginal bleeding or discharge.  She reports normal bowel function without the use of medications although notes a history of constipation previously requiring her to present to the emergency department.  She was treated with lactulose at that time.  She notes some urinary frequency since being on hydrochlorothiazide.  She endorses a good appetite without nausea or emesis.   She worked for years as a Agricultural engineer.  Her daughter is a Engineer, civil (consulting).   Her GYN history is notable for starting Depo-Provera after the birth of her daughter at age 43.  She was on Depo for approximately 20-25 years.  When she stopped the Depo-Provera, she never had reinitiation of menstrual cycles.   01/14/23: Hysteroscopy with endometrial sampling. Operative findings: Mobile 8-10 cm uterus, no parametrial involvement. Normal cervix. Atrophic endocervix. Endometrium with significant adhesions and polypoid tissue. Anterior uterine mass, possible fibroid. Tubal ostia not visualized. Pictures in media.  Pathology: Very scant atrophic endometrial epithelium. Muco-inflammatory and  squamous debris with admixed benign endocervical epithelium and endocervix showing chronic endocervicitis..   We discussed follow-up given scant tissue with either definitive and diagnostic hysterectomy versus follow-up pelvic ultrasound.  The patient opted for a pelvic ultrasound.   Pelvic ultrasound in early March showed small cystic spaces measuring up to 4 mm within the central uterus, no discrete mass.  The masslike appearance of the endometrium seen on prior ultrasound is no longer seen.  Follow-up repeat imaging recommended in 3-6 months.   Pelvic ultrasound on 05/10/2025 revealed an endometrium measuring 21.2 mm, abnormal in appearance with complex echotexture and multiple cystic spaces.  Transitional zone poorly defined.   06/17/23: Robotic-assisted laparoscopic total hysterectomy with bilateral salpingo-oophorectomy Findings: On EUA, mildly enlarged mobile uterus. On intra-abdominal entry, normal upper abdominal survey. Normal appearing omentum, small and large bowel. Short small bowel mesentery limited ability of bowel to be placed in the upper abdomen. Uterus 8-10 cm and bulbous but normal appearing. Normal appearing adnexa. Visibility quite limited due to adiposity within the pelvis, large and redundant colon. No ascites.   Interval History: Doing well.  Denies any significant abdominal or pelvic pain.  Reports baseline bowel bladder function.  Denies any vaginal bleeding or discharge.  Past Medical/Surgical History: Past Medical History:  Diagnosis Date   Arthritis    Carpal tunnel syndrome on both sides    Full dentures    Hypertension    Type 2 diabetes mellitus (HCC)    followed by pcp    (01-12-2023 per pt pcp note in epic DM2 ,however , per pt does have diabetes anymore just takes metformen to lose weight)   Uterine mass     Past Surgical History:  Procedure Laterality Date  COLONOSCOPY WITH PROPOFOL  04/22/2021   dr Jolinda Croak   DILATATION & CURETTAGE/HYSTEROSCOPY WITH  MYOSURE N/A 01/14/2023   Procedure: DILATATION & CURETTAGE/HYSTEROSCOPY WITH MYOSURE;  Surgeon: Carver Fila, MD;  Location: [facility];  Service: Gynecology;  Laterality: N/A;   MULTIPLE TOOTH EXTRACTIONS     in [place]   ROBOTIC ASSISTED TOTAL HYSTERECTOMY WITH BILATERAL SALPINGO OOPHERECTOMY Bilateral 06/17/2023   Procedure: XI ROBOTIC ASSISTED TOTAL HYSTERECTOMY WITH BILATERAL SALPINGO OOPHORECTOMY;  Surgeon: Carver Fila, MD;  Location: [facility];  Service: Gynecology;  Laterality: Bilateral;    Family History  Problem Relation Age of Onset   Heart disease Mother 27       MI   Cancer Sister 89       lung cancer   Stroke Maternal Grandmother    Heart disease Maternal Grandfather    Stroke Daughter    Diabetes Neg Hx    Colon cancer Neg Hx    Breast cancer Neg Hx    Ovarian cancer Neg Hx    Endometrial cancer Neg Hx    Pancreatic cancer Neg Hx    Prostate cancer Neg Hx     Social History   Socioeconomic History   Marital status: Single    Spouse name: Not on file   Number of children: Not on file   Years of education: Not on file   Highest education level: Not on file  Occupational History   Occupation: retired  Tobacco Use   Smoking status: Never   Smokeless tobacco: Never  Vaping Use   Vaping status: Never Used  Substance and Sexual Activity   Alcohol use: No   Drug use: Never   Sexual activity: Not on file  Other Topics Concern   Not on file  Social History Narrative   Lives with daughter with CP [name] age 7).  No long term relationship.  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   Social Determinants of Health   Financial Resource Strain: Low Risk  (07/07/2023)   Overall Financial Resource Strain (CARDIA)    Difficulty of Paying Living Expenses: Not hard at all  Food Insecurity: No Food Insecurity (07/07/2023)   Hunger Vital Sign    Worried About Running Out of Food in the Last Year: Never true    Ran Out of Food in the  Last Year: Never true  Transportation Needs: No Transportation Needs (07/07/2023)   PRAPARE - Administrator, Civil Service (Medical): No    Lack of Transportation (Non-Medical): No  Physical Activity: Insufficiently Active (07/07/2023)   Exercise Vital Sign    Days of Exercise per Week: 3 days    Minutes of Exercise per Session: 30 min  Stress: No Stress Concern Present (07/07/2023)   [employer] of Occupational Health - Occupational Stress Questionnaire    Feeling of Stress : Not at all  Social Connections: Moderately Isolated (07/07/2023)   Social Connection and Isolation Panel [NHANES]    Frequency of Communication with Friends and Family: More than three times a week    Frequency of Social Gatherings with Friends and Family: Three times a week    Attends Religious Services: More than 4 times per year    Active Member of Clubs or Organizations: No    Attends Banker Meetings: Never    Marital Status: Never married    Current Medications:  Current Outpatient Medications:    cetirizine (ZYRTEC) 10 MG tablet, TAKE 1 TABLET(10  MG) BY MOUTH DAILY (Patient taking differently: Take 10 mg by mouth daily as needed for allergies.), Disp: 30 tablet, Rfl: 2   fluticasone (FLONASE) 50 MCG/ACT nasal spray, Place 1 spray into both nostrils daily as needed for allergies or rhinitis., Disp: , Rfl:    hydrochlorothiazide (HYDRODIURIL) 25 MG tablet, TAKE 1 TABLET(25 MG) BY MOUTH DAILY, Disp: 90 tablet, Rfl: 3   lidocaine (LIDODERM) 5 %, Place 1 patch onto the skin daily. Remove & Discard patch within 12 hours or as directed by MD (Patient taking differently: Place 1 patch onto the skin daily as needed (back/shoulder pain). Remove & Discard patch within 12 hours or as directed by MD), Disp: 30 patch, Rfl: 0   MAXITROL 3.5-10000-0.1 OINT, Place 1 Application into both eyes 2 (two) times daily., Disp: , Rfl:    metFORMIN (GLUCOPHAGE-XR) 500 MG 24 hr tablet, Take 1 tablet (500 mg  total) by mouth 2 (two) times daily with a meal., Disp: 180 tablet, Rfl: 1   Olopatadine HCl 0.2 % SOLN, Apply 1 drop to eye daily. (Patient not taking: Reported on 05/19/2023), Disp: 2.5 mL, Rfl: 0   Polyethyl Glycol-Propyl Glycol (LUBRICATING EYE DROPS OP), Place 1 drop into both eyes daily as needed (dry eyes)., Disp: , Rfl:    senna-docusate (SENOKOT-S) 8.6-50 MG tablet, Take 2 tablets by mouth at bedtime. For AFTER surgery, do not take if having diarrhea (Patient not taking: Reported on 06/08/2023), Disp: 30 tablet, Rfl: 0   traMADol (ULTRAM) 50 MG tablet, Take 1 tablet (50 mg total) by mouth every 6 (six) hours as needed for severe pain. For AFTER surgery only, do not take and drive (Patient not taking: Reported on 06/08/2023), Disp: 10 tablet, Rfl: 0   Vitamin D, Ergocalciferol, (DRISDOL) 1.25 MG (50000 UNIT) CAPS capsule, Take 1 capsule (50,000 Units total) by mouth every 7 (seven) days., Disp: 12 capsule, Rfl: 1  Review of Systems: Denies appetite changes, fevers, chills, fatigue, unexplained weight changes. Denies hearing loss, neck lumps or masses, mouth sores, ringing in ears or voice changes. Denies cough or wheezing.  Denies shortness of breath. Denies chest pain or palpitations. Denies leg swelling. Denies abdominal distention, pain, blood in stools, constipation, diarrhea, nausea, vomiting, or early satiety. Denies pain with intercourse, dysuria, frequency, hematuria or incontinence. Denies hot flashes, pelvic pain, vaginal bleeding or vaginal discharge.   Denies joint pain, back pain or muscle pain/cramps. Denies itching, rash, or wounds. Denies dizziness, headaches, numbness or seizures. Denies swollen lymph nodes or glands, denies easy bruising or bleeding. Denies anxiety, depression, confusion, or decreased concentration.  Physical Exam: BP 117/76 (BP Location: Left Arm, Patient Position: Sitting)   Pulse 83   Temp 99 F (37.2 C) (Oral)   Resp 18   Ht 5\' 7"  (1.702 m)   Wt  249 lb (112.9 kg)   SpO2 99%   BMI 39.00 kg/m  General: Alert, oriented, no acute distress. HEENT: Normocephalic, atraumatic, sclera anicteric. Chest: Clear to auscultation bilaterally.  No wheezes or rhonchi. Cardiovascular: Regular rate and rhythm, no murmurs. Abdomen: Obese, soft, nontender.  Normoactive bowel sounds.  No masses or hepatosplenomegaly appreciated.  Well-healed incisions, remaining Dermabond removed. Extremities: Grossly normal range of motion.  Warm, well perfused.  No edema bilaterally. Skin: No rashes or lesions noted. GU: Normal appearing external genitalia without erythema, excoriation, or lesions.  Speculum exam reveals cuff intact, suture visible.  Bimanual exam reveals cuff intact, no fluctuance or tenderness to palpation.    Laboratory & Radiologic  Studies: A. UTERUS, CERVIX, BILATERAL FALLOPIAN TUBES AND OVARIES:  - Cervix: Benign, nabothian cysts  - Endometrium: Focal atypical hyperplasia with background of endometrial  polyp and disordered proliferative endometrium (see comment)  - Myometrium: Adenomyosis  - Bilateral fallopian tubes and ovaries: Benign, no significant  pathologic changes   COMMENT:   - While no atypia was observed in the tissue submitted for frozen  section, additional sections revealed focal areas of atypical  hyperplasia.  No malignancy is identified.  Selected slides from this  case were reviewed with Dr. Wenda Low who agrees with the above  interpretation.   Assessment & Plan: Janet Woods is a 67 y.o. woman s/p TRH/BSO in the setting of thickened endometrium. Final pathology with focal atypical hyperplasia, no malignancy.  The patient is doing well post-op. Discussed continued expectations and restrictions. We reviewed findings again during surgery and pathology results. The patient was given a copy of her pathology report.  She will be discharged back to her primary care provider and OB/GYN.  18 minutes of total time was spent  for this patient encounter, including preparation, face-to-face counseling with the patient and coordination of care, and documentation of the encounter.  Eugene Garnet, MD  Division of Gynecologic Oncology  Department of Obstetrics and Gynecology  [facility]

## 2023-07-16 ENCOUNTER — Encounter: Payer: Self-pay | Admitting: Gynecologic Oncology

## 2023-07-16 ENCOUNTER — Inpatient Hospital Stay: Payer: Medicare HMO | Attending: Gynecologic Oncology | Admitting: Gynecologic Oncology

## 2023-07-16 VITALS — BP 117/76 | HR 83 | Temp 99.0°F | Resp 18 | Ht 67.0 in | Wt 249.0 lb

## 2023-07-16 DIAGNOSIS — Z90722 Acquired absence of ovaries, bilateral: Secondary | ICD-10-CM

## 2023-07-16 DIAGNOSIS — N858 Other specified noninflammatory disorders of uterus: Secondary | ICD-10-CM

## 2023-07-16 DIAGNOSIS — Z90710 Acquired absence of both cervix and uterus: Secondary | ICD-10-CM

## 2023-07-16 NOTE — Patient Instructions (Signed)
It was good to see you today!  Please remember, no heavy lifting for at least 6 weeks after surgery and nothing in the vagina for at least 10 weeks.    Please do not hesitate to call if you need anything in the future.

## 2023-07-21 ENCOUNTER — Other Ambulatory Visit: Payer: Self-pay | Admitting: Student

## 2023-07-21 DIAGNOSIS — Z1231 Encounter for screening mammogram for malignant neoplasm of breast: Secondary | ICD-10-CM

## 2023-07-23 DIAGNOSIS — H00025 Hordeolum internum left lower eyelid: Secondary | ICD-10-CM | POA: Diagnosis not present

## 2023-07-29 LAB — HM DIABETES EYE EXAM

## 2023-08-12 DIAGNOSIS — H00025 Hordeolum internum left lower eyelid: Secondary | ICD-10-CM | POA: Diagnosis not present

## 2023-09-02 ENCOUNTER — Encounter: Payer: Self-pay | Admitting: Student

## 2023-09-02 ENCOUNTER — Ambulatory Visit: Payer: Medicare HMO | Admitting: Student

## 2023-09-02 ENCOUNTER — Telehealth: Payer: Self-pay | Admitting: Student

## 2023-09-02 ENCOUNTER — Other Ambulatory Visit: Payer: Self-pay

## 2023-09-02 VITALS — BP 143/88 | HR 79 | Ht 66.0 in | Wt 253.2 lb

## 2023-09-02 DIAGNOSIS — E119 Type 2 diabetes mellitus without complications: Secondary | ICD-10-CM

## 2023-09-02 DIAGNOSIS — I10 Essential (primary) hypertension: Secondary | ICD-10-CM

## 2023-09-02 DIAGNOSIS — Z7984 Long term (current) use of oral hypoglycemic drugs: Secondary | ICD-10-CM | POA: Diagnosis not present

## 2023-09-02 LAB — POCT GLYCOSYLATED HEMOGLOBIN (HGB A1C): HbA1c, POC (controlled diabetic range): 7 % (ref 0.0–7.0)

## 2023-09-02 NOTE — Telephone Encounter (Signed)
Patient had to leave before her appointment time this morning but was able to get A1c.  Called to discuss that A1c is 7, can continue current regimen of metformin twice daily

## 2023-09-02 NOTE — Progress Notes (Signed)
    SUBJECTIVE:   CHIEF COMPLAINT / HPI:   T2DM Last A1c 7, currently taking metformin 500 mg BID. Pt had to leave before apt time. Did not see her today.    OBJECTIVE:   BP (!) 143/88   Pulse 79   Ht 5\' 6"  (1.676 m)   Wt 253 lb 3.2 oz (114.9 kg)   SpO2 94%   BMI 40.87 kg/m     ASSESSMENT/PLAN:   Type 2 diabetes mellitus without complication, without long-term current use of insulin (HCC) Pt left before her apt time and before being seen. Did have A1c checked today which was 7.  Continue current regimen   Essential hypertension Slightly above goal but did not recheck or discuss as pt left before her apt time.  Continue current regimen, will continue to monitor at next visit.      Dr. Erick Alley, DO [facility]

## 2023-09-13 NOTE — Assessment & Plan Note (Signed)
Slightly above goal but did not recheck or discuss as pt left before her apt time.  Continue current regimen, will continue to monitor at next visit.

## 2023-09-13 NOTE — Assessment & Plan Note (Signed)
Pt left before her apt time and before being seen. Did have A1c checked today which was 7.  Continue current regimen

## 2023-10-25 ENCOUNTER — Emergency Department (HOSPITAL_COMMUNITY): Payer: Medicare HMO

## 2023-10-25 ENCOUNTER — Other Ambulatory Visit: Payer: Self-pay

## 2023-10-25 ENCOUNTER — Emergency Department (HOSPITAL_COMMUNITY)
Admission: EM | Admit: 2023-10-25 | Discharge: 2023-10-26 | Disposition: A | Discharge status: Home / Self Care | Payer: Medicare HMO | Attending: Emergency Medicine | Admitting: Emergency Medicine

## 2023-10-25 ENCOUNTER — Encounter (HOSPITAL_COMMUNITY): Payer: Self-pay | Admitting: Emergency Medicine

## 2023-10-25 DIAGNOSIS — R0689 Other abnormalities of breathing: Secondary | ICD-10-CM | POA: Diagnosis not present

## 2023-10-25 DIAGNOSIS — D6861 Antiphospholipid syndrome: Secondary | ICD-10-CM | POA: Diagnosis not present

## 2023-10-25 DIAGNOSIS — E119 Type 2 diabetes mellitus without complications: Secondary | ICD-10-CM | POA: Insufficient documentation

## 2023-10-25 DIAGNOSIS — R0781 Pleurodynia: Secondary | ICD-10-CM | POA: Diagnosis not present

## 2023-10-25 DIAGNOSIS — Z6839 Body mass index (BMI) 39.0-39.9, adult: Secondary | ICD-10-CM | POA: Diagnosis not present

## 2023-10-25 DIAGNOSIS — R0789 Other chest pain: Secondary | ICD-10-CM | POA: Diagnosis not present

## 2023-10-25 DIAGNOSIS — J9811 Atelectasis: Secondary | ICD-10-CM | POA: Diagnosis not present

## 2023-10-25 DIAGNOSIS — Z79899 Other long term (current) drug therapy: Secondary | ICD-10-CM | POA: Insufficient documentation

## 2023-10-25 DIAGNOSIS — R0902 Hypoxemia: Secondary | ICD-10-CM | POA: Diagnosis not present

## 2023-10-25 DIAGNOSIS — E669 Obesity, unspecified: Secondary | ICD-10-CM | POA: Insufficient documentation

## 2023-10-25 DIAGNOSIS — R0602 Shortness of breath: Secondary | ICD-10-CM | POA: Diagnosis not present

## 2023-10-25 DIAGNOSIS — I10 Essential (primary) hypertension: Secondary | ICD-10-CM | POA: Insufficient documentation

## 2023-10-25 DIAGNOSIS — Z7984 Long term (current) use of oral hypoglycemic drugs: Secondary | ICD-10-CM | POA: Insufficient documentation

## 2023-10-25 DIAGNOSIS — I2699 Other pulmonary embolism without acute cor pulmonale: Secondary | ICD-10-CM | POA: Insufficient documentation

## 2023-10-25 DIAGNOSIS — R079 Chest pain, unspecified: Secondary | ICD-10-CM | POA: Diagnosis not present

## 2023-10-25 LAB — CBC WITH DIFFERENTIAL/PLATELET
Abs Immature Granulocytes: 0.03 10*3/uL (ref 0.00–0.07)
Basophils Absolute: 0 10*3/uL (ref 0.0–0.1)
Basophils Relative: 0 %
Eosinophils Absolute: 0.1 10*3/uL (ref 0.0–0.5)
Eosinophils Relative: 1 %
HCT: 41.3 % (ref 36.0–46.0)
Hemoglobin: 13.2 g/dL (ref 12.0–15.0)
Immature Granulocytes: 0 %
Lymphocytes Relative: 25 %
Lymphs Abs: 2.6 10*3/uL (ref 0.7–4.0)
MCH: 28.1 pg (ref 26.0–34.0)
MCHC: 32 g/dL (ref 30.0–36.0)
MCV: 88.1 fL (ref 80.0–100.0)
Monocytes Absolute: 0.9 10*3/uL (ref 0.1–1.0)
Monocytes Relative: 8 %
Neutro Abs: 6.7 10*3/uL (ref 1.7–7.7)
Neutrophils Relative %: 66 %
Platelets: 317 10*3/uL (ref 150–400)
RBC: 4.69 MIL/uL (ref 3.87–5.11)
RDW: 14.1 % (ref 11.5–15.5)
WBC: 10.4 10*3/uL (ref 4.0–10.5)
nRBC: 0 % (ref 0.0–0.2)

## 2023-10-25 LAB — COMPREHENSIVE METABOLIC PANEL
ALT: 22 U/L (ref 0–44)
AST: 23 U/L (ref 15–41)
Albumin: 3.6 g/dL (ref 3.5–5.0)
Alkaline Phosphatase: 64 U/L (ref 38–126)
Anion gap: 11 (ref 5–15)
BUN: 6 mg/dL — ABNORMAL LOW (ref 8–23)
CO2: 25 mmol/L (ref 22–32)
Calcium: 9.3 mg/dL (ref 8.9–10.3)
Chloride: 102 mmol/L (ref 98–111)
Creatinine, Ser: 0.68 mg/dL (ref 0.44–1.00)
GFR, Estimated: >60 mL/min (ref >60)
Glucose, Bld: 103 mg/dL — ABNORMAL HIGH (ref 70–99)
Potassium: 3.4 mmol/L — ABNORMAL LOW (ref 3.5–5.1)
Sodium: 138 mmol/L (ref 135–145)
Total Bilirubin: 0.3 mg/dL (ref <1.2)
Total Protein: 8.4 g/dL — ABNORMAL HIGH (ref 6.5–8.1)

## 2023-10-25 LAB — TROPONIN I (HIGH SENSITIVITY)
Troponin I (High Sensitivity): 4 ng/L (ref <18)
Troponin I (High Sensitivity): 5 ng/L (ref <18)

## 2023-10-25 MED ORDER — IOHEXOL 350 MG/ML SOLN
75.0000 mL | Freq: Once | INTRAVENOUS | Status: AC | PRN
  Administered 2023-10-25: 75 mL via INTRAVENOUS

## 2023-10-25 MED ORDER — OXYCODONE-ACETAMINOPHEN 5-325 MG PO TABS
1.0000 | ORAL_TABLET | Freq: Once | ORAL | Status: AC
Start: 2023-10-25 — End: 2023-10-25
  Administered 2023-10-25: 1 via ORAL
  Filled 2023-10-25: qty 1

## 2023-10-25 MED ORDER — LIDOCAINE 5 % EX PTCH
1.0000 | MEDICATED_PATCH | CUTANEOUS | Status: DC
  Administered 2023-10-25: 1 via TRANSDERMAL
  Filled 2023-10-25: qty 1

## 2023-10-25 MED ORDER — METHOCARBAMOL 500 MG PO TABS
1000.0000 mg | ORAL_TABLET | Freq: Once | ORAL | Status: AC
  Administered 2023-10-25: 1000 mg via ORAL
  Filled 2023-10-25: qty 2

## 2023-10-25 MED ORDER — KETOROLAC TROMETHAMINE 15 MG/ML IJ SOLN
15.0000 mg | Freq: Once | INTRAMUSCULAR | Status: AC
  Administered 2023-10-25: 15 mg via INTRAVENOUS
  Filled 2023-10-25: qty 1

## 2023-10-25 NOTE — ED Triage Notes (Signed)
Pt reports waking this morning with right rib pain. Pt states it hurts some when she tries to breath. Denies recent fever or trauma. VSS. No acute distress noted. Lungs CTA

## 2023-10-25 NOTE — ED Notes (Signed)
Patient ambulated in the halls with a steady gait, no complaints of SOB or lightheadedness. Returned to bed and placed on cardiac monitoring.

## 2023-10-25 NOTE — ED Provider Notes (Signed)
[place] EMERGENCY DEPARTMENT AT [facility] Provider Note   CSN: [id number] Arrival date & time: 10/25/23  [id number]     History {Add pertinent medical, surgical, social history, OB history to HPI:1} Chief Complaint  Patient presents with   Shortness of Breath    Tinaya Sessums Buske is a 67 y.o. female.  67 year old female with a history of hx of HTN, DM, arthritis presents for right lower lateral chest pain which began at 11AM and has been constant. Pain is pleuritic and unrelieved with Tylenol. She has had associated SOB, DOE, and a nonproductive cough; feels like there is something "caught" in her throat. No fever, syncope, hemoptysis, leg swelling. Patient with hx of TAH/BSO on 06/17/23 for uterine mass. Reports FHx of VTE in one of her cousins. Nonsmoker.   The history is provided by the patient. No language interpreter was used.  Shortness of Breath      Home Medications Prior to Admission medications   Medication Sig Start Date End Date Taking? Authorizing Provider  cetirizine (ZYRTEC) 10 MG tablet TAKE 1 TABLET(10 MG) BY MOUTH DAILY Patient taking differently: Take 10 mg by mouth daily as needed for allergies. 02/07/22   Simmons-Robinson, Makiera, MD  fluticasone (FLONASE) 50 MCG/ACT nasal spray Place 1 spray into both nostrils daily as needed for allergies or rhinitis.    [provider]  hydrochlorothiazide (HYDRODIURIL) 25 MG tablet TAKE 1 TABLET(25 MG) BY MOUTH DAILY 07/03/23   Erick Alley, DO  lidocaine (LIDODERM) 5 % Place 1 patch onto the skin daily. Remove & Discard patch within 12 hours or as directed by MD Patient taking differently: Place 1 patch onto the skin daily as needed (back/shoulder pain). Remove & Discard patch within 12 hours or as directed by MD 11/25/18   Petrucelli, Lelon Mast R, PA-C  MAXITROL 3.5-10000-0.1 OINT Place 1 Application into both eyes 2 (two) times daily. 05/19/23   [provider]  metFORMIN (GLUCOPHAGE-XR) 500 MG 24 hr  tablet Take 1 tablet (500 mg total) by mouth 2 (two) times daily with a meal. 06/30/23   Erick Alley, DO  Polyethyl Glycol-Propyl Glycol (LUBRICATING EYE DROPS OP) Place 1 drop into both eyes daily as needed (dry eyes).    [provider]  Vitamin D, Ergocalciferol, (DRISDOL) 1.25 MG (50000 UNIT) CAPS capsule Take 1 capsule (50,000 Units total) by mouth every 7 (seven) days. 09/30/22   Erick Alley, DO      Allergies    Patient has no known allergies.    Review of Systems   Review of Systems  Respiratory:  Positive for shortness of breath.   Ten systems reviewed and are negative for acute change, except as noted in the HPI.    Physical Exam Updated Vital Signs BP (!) 181/98 (BP Location: Left Arm)   Pulse 88   Temp 98.4 F (36.9 C)   Resp (!) 24   Ht 5\' 6"  (1.676 m)   Wt 111.1 kg   SpO2 95%   BMI 39.54 kg/m   Physical Exam Vitals and nursing note reviewed.  Constitutional:      General: She is not in acute distress.    Appearance: She is well-developed. She is not diaphoretic.     Comments: Obese AA female, nontoxic appearing.  HENT:     Head: Normocephalic and atraumatic.  Eyes:     General: No scleral icterus.    Extraocular Movements: EOM normal.     Conjunctiva/sclera: Conjunctivae normal.  Cardiovascular:  Rate and Rhythm: Normal rate and regular rhythm.     Pulses: Normal pulses.  Pulmonary:     Effort: Pulmonary effort is normal. No respiratory distress.     Breath sounds: No stridor. No wheezing.     Comments: Lungs CTAB. Respirations even and unlabored. Musculoskeletal:        General: Normal range of motion.     Cervical back: Normal range of motion.     Right lower leg: No edema.     Left lower leg: No edema.  Skin:    General: Skin is warm and dry.     Coloration: Skin is not pale.     Findings: No erythema or rash.  Neurological:     Mental Status: She is alert and oriented to person, place, and time.  Psychiatric:        Mood and  Affect: Mood and affect normal.        Behavior: Behavior normal.     ED Results / Procedures / Treatments   Labs (all labs ordered are listed, but only abnormal results are displayed) Labs Reviewed  COMPREHENSIVE METABOLIC PANEL - Abnormal; Notable for the following components:      Result Value   Potassium 3.4 (*)    Glucose, Bld 103 (*)    BUN 6 (*)    Total Protein 8.4 (*)    All other components within normal limits  CBC WITH DIFFERENTIAL/PLATELET  TROPONIN I (HIGH SENSITIVITY)  TROPONIN I (HIGH SENSITIVITY)    EKG EKG Interpretation Date/Time:  Sunday October 25 2023 19:24:14 EST Ventricular Rate:  93 PR Interval:  164 QRS Duration:  94 QT Interval:  356 QTC Calculation: 442 R Axis:   -13  Text Interpretation: Normal sinus rhythm Minimal voltage criteria for LVH, may be normal variant ( Cornell product ) Borderline ECG Confirmed by Gloris Manchester [id number]) on 10/25/2023 7:25:24 PM  Radiology CT Angio Chest PE W and/or Wo Contrast  Result Date: 10/25/2023 CLINICAL DATA:  Right-sided chest pain, initial encounter EXAM: CT ANGIOGRAPHY CHEST WITH CONTRAST TECHNIQUE: Multidetector CT imaging of the chest was performed using the standard protocol during bolus administration of intravenous contrast. Multiplanar CT image reconstructions and MIPs were obtained to evaluate the vascular anatomy. RADIATION DOSE REDUCTION: This exam was performed according to the departmental dose-optimization program which includes automated exposure control, adjustment of the mA and/or kV according to patient size and/or use of iterative reconstruction technique. CONTRAST:  75mL OMNIPAQUE IOHEXOL 350 MG/ML SOLN COMPARISON:  Chest x-ray from earlier in the same day. FINDINGS: Cardiovascular: Thoracic aorta is well visualized without significant atherosclerotic calcifications. No aneurysmal dilatation or dissection is seen. Heart is not significantly enlarged. No coronary calcifications are noted. The pulmonary  artery shows a normal branching pattern bilaterally. Small filling defect is noted within the right lower lobe best seen on images 65 and 67 of series 7 consistent with small segmental pulmonary embolus. No right heart strain is noted. No other filling defects are seen. Mediastinum/Nodes: Esophagus is within normal limits. No hilar or mediastinal adenopathy is noted. The thoracic inlet is within normal limits. Lungs/Pleura: Lungs are well aerated bilaterally. No focal infiltrate or effusion is seen. Upper Abdomen: Upper abdomen is within normal limits. Musculoskeletal: No chest wall abnormality. No acute or significant osseous findings. Review of the MIP images confirms the above findings. IMPRESSION: Small right lower lobe pulmonary embolus without significant right heart strain. No other focal abnormality is noted. Critical Value/emergent results were called by telephone at  the time of interpretation on 10/25/2023 at 9:27 pm to Dr. Gloris Manchester , who verbally acknowledged these results. Electronically Signed   By: Alcide Clever M.D.   On: 10/25/2023 21:29   DG Chest 2 View  Result Date: 10/25/2023 CLINICAL DATA:  Shortness of breath and left-sided chest pain, initial EN EXAM: CHEST - 2 VIEW COMPARISON:  05/04/2021 FINDINGS: Cardiac shadow is stable. Lungs are well aerated bilaterally. Mild basilar atelectasis is seen. No focal confluent infiltrate or effusion is noted. No bony abnormality is noted. IMPRESSION: Mild bibasilar atelectasis. Electronically Signed   By: Alcide Clever M.D.   On: 10/25/2023 20:07    Procedures Procedures  {Document cardiac monitor, telemetry assessment procedure when appropriate:1}  Medications Ordered in ED Medications  lidocaine (LIDODERM) 5 % 1 patch (has no administration in time range)  methocarbamol (ROBAXIN) tablet 1,000 mg (has no administration in time range)  oxyCODONE-acetaminophen (PERCOCET/ROXICET) 5-325 MG per tablet 1 tablet (has no administration in time range)   iohexol (OMNIPAQUE) 350 MG/ML injection 75 mL (75 mLs Intravenous Contrast Given 10/25/23 2113)    ED Course/ Medical Decision Making/ A&P   {   Click here for ABCD2, HEART and other calculatorsREFRESH Note before signing :1}                              Medical Decision Making Amount and/or Complexity of Data Reviewed Labs: ordered. Radiology: ordered.   ***  {Document critical care time when appropriate:1} {Document review of labs and clinical decision tools ie heart score, Chads2Vasc2 etc:1}  {Document your independent review of radiology images, and any outside records:1} {Document your discussion with family members, caretakers, and with consultants:1} {Document social determinants of health affecting pt's care:1} {Document your decision making why or why not admission, treatments were needed:1} Final Clinical Impression(s) / ED Diagnoses Final diagnoses:  None    Rx / DC Orders ED Discharge Orders     None

## 2023-10-25 NOTE — ED Provider Triage Note (Signed)
Emergency Medicine Provider Triage Evaluation Note  Janet Woods , a 67 y.o. female  was evaluated in triage.  Pt complains of chest pain, shortness of breath.  Review of Systems  Positive: Right lower chest pain, shortness of breath Negative: Nausea, diaphoresis  Physical Exam  BP (!) 143/97 (BP Location: Right Arm)   Pulse 98   Temp 98.6 F (37 C) (Oral)   Resp 18   Ht 5\' 6"  (1.676 m)   Wt 111.1 kg   SpO2 96%   BMI 39.54 kg/m  Gen:   Awake, no distress   Resp:  Normal effort  MSK:   Moves extremities without difficulty   Medical Decision Making  Medically screening exam initiated at 7:31 PM.  Appropriate orders placed.  Janet Woods was informed that the remainder of the evaluation will be completed by another provider, this initial triage assessment does not replace that evaluation, and the importance of remaining in the ED until their evaluation is complete.  Patient presents for a right lower chest pain, starting today.  She denies recent injury.  Onset of pain was around 11 AM.  It has been persistent since that time.  It is pleuritic in nature.  It is not worsened with movements or palpation.  Patient is concerned of a blood clot.  She has no personal history of blood clots.  She does have family history of VTE.  She is not on a blood thinner.    Gloris Manchester, MD 10/25/23 [phone]

## 2023-10-26 LAB — ANTITHROMBIN III: AntiThromb III Func: 98 % (ref 75–120)

## 2023-10-26 MED ORDER — OXYCODONE-ACETAMINOPHEN 5-325 MG PO TABS: 1.0000 | ORAL_TABLET | ORAL | 0 refills | Status: DC | PRN

## 2023-10-26 MED ORDER — APIXABAN (ELIQUIS) VTE STARTER PACK (10MG AND 5MG): ORAL_TABLET | ORAL | 0 refills | Status: DC

## 2023-10-26 MED ORDER — APIXABAN 5 MG PO TABS
5.0000 mg | ORAL_TABLET | Freq: Two times a day (BID) | ORAL | Status: DC
Start: 2023-11-01 — End: 2023-10-26

## 2023-10-26 MED ORDER — APIXABAN 5 MG PO TABS
10.0000 mg | ORAL_TABLET | Freq: Two times a day (BID) | ORAL | Status: DC
  Administered 2023-10-26: 10 mg via ORAL
  Filled 2023-10-26: qty 2

## 2023-10-26 NOTE — Progress Notes (Signed)
PHARMACY - ANTICOAGULATION CONSULT NOTE  Pharmacy Consult for eliquis Indication: pulmonary embolus  No Known Allergies  Patient Measurements: Height: 5\' 6"  (167.6 cm) Weight: 111.1 kg (245 lb) IBW/kg (Calculated) : 59.3  Vital Signs: Temp: 98.4 F (36.9 C) (12/08 2117) Temp Source: Oral (12/08 1921) BP: 150/86 (12/08 2330) Pulse Rate: 86 (12/08 2330)  Labs: Recent Labs    10/25/23 1931 10/25/23 2153  HGB 13.2  --   HCT 41.3  --   PLT 317  --   CREATININE 0.68  --   TROPONINIHS 4 5    Estimated Creatinine Clearance: 88.5 mL/min (by C-G formula based on SCr of 0.68 mg/dL).  Assessment: 33 yoF presented to ED with SOB. Pharmacy consulted to dose apixaban for PE.  -CBC stable -CTA: Small right lower lobe pulmonary embolus without significant right heart strain -No prior personal history of VTE or anticoagulation   Goal of Therapy:   Monitor platelets by anticoagulation protocol: Yes   Plan:  Start Eliquis 10mg  PO BID x7 days, followed by 5mg  PO BID Follow up education and copay  Arabella Merles, PharmD. Clinical Pharmacist 10/26/2023 12:47 AM

## 2023-10-26 NOTE — ED Notes (Signed)
Discharge instructions reviewed.   Newly prescribed medications discussed. MD consulted for pain medication to be sent to pharmacy. Pharmacy verified for accuracy.   Opportunity for questions and concerns provided.   Alert, oriented and ambulatory. Displays no signs of distress prior to leaving with family member.   Encouraged to return for worsening symptoms, shortness of breath or unmanageable chest pains.

## 2023-10-26 NOTE — ED Notes (Signed)
 Central telemetry contacted for monitor setup

## 2023-10-27 ENCOUNTER — Other Ambulatory Visit: Payer: Self-pay

## 2023-10-27 ENCOUNTER — Ambulatory Visit: Payer: Medicare HMO | Admitting: Family Medicine

## 2023-10-27 ENCOUNTER — Emergency Department (HOSPITAL_COMMUNITY): Payer: Medicare HMO

## 2023-10-27 ENCOUNTER — Emergency Department (HOSPITAL_COMMUNITY)
Admission: EM | Admit: 2023-10-27 | Discharge: 2023-10-27 | Disposition: A | Discharge status: Home / Self Care | Payer: Medicare HMO | Attending: Emergency Medicine | Admitting: Emergency Medicine

## 2023-10-27 VITALS — BP 148/82 | HR 89 | Ht 66.0 in | Wt 249.6 lb

## 2023-10-27 DIAGNOSIS — I2699 Other pulmonary embolism without acute cor pulmonale: Secondary | ICD-10-CM

## 2023-10-27 DIAGNOSIS — I10 Essential (primary) hypertension: Secondary | ICD-10-CM | POA: Diagnosis not present

## 2023-10-27 DIAGNOSIS — Z86711 Personal history of pulmonary embolism: Secondary | ICD-10-CM | POA: Insufficient documentation

## 2023-10-27 DIAGNOSIS — I517 Cardiomegaly: Secondary | ICD-10-CM | POA: Diagnosis not present

## 2023-10-27 DIAGNOSIS — R0602 Shortness of breath: Secondary | ICD-10-CM | POA: Diagnosis not present

## 2023-10-27 DIAGNOSIS — Z7901 Long term (current) use of anticoagulants: Secondary | ICD-10-CM | POA: Insufficient documentation

## 2023-10-27 DIAGNOSIS — J9811 Atelectasis: Secondary | ICD-10-CM | POA: Diagnosis not present

## 2023-10-27 LAB — TROPONIN I (HIGH SENSITIVITY): Troponin I (High Sensitivity): 5 ng/L (ref <18)

## 2023-10-27 LAB — CBC WITH DIFFERENTIAL/PLATELET
Abs Immature Granulocytes: 0.03 10*3/uL (ref 0.00–0.07)
Basophils Absolute: 0 10*3/uL (ref 0.0–0.1)
Basophils Relative: 0 %
Eosinophils Absolute: 0.1 10*3/uL (ref 0.0–0.5)
Eosinophils Relative: 1 %
HCT: 40.1 % (ref 36.0–46.0)
Hemoglobin: 12.7 g/dL (ref 12.0–15.0)
Immature Granulocytes: 0 %
Lymphocytes Relative: 19 %
Lymphs Abs: 2 10*3/uL (ref 0.7–4.0)
MCH: 27.9 pg (ref 26.0–34.0)
MCHC: 31.7 g/dL (ref 30.0–36.0)
MCV: 87.9 fL (ref 80.0–100.0)
Monocytes Absolute: 0.9 10*3/uL (ref 0.1–1.0)
Monocytes Relative: 8 %
Neutro Abs: 7.8 10*3/uL — ABNORMAL HIGH (ref 1.7–7.7)
Neutrophils Relative %: 72 %
Platelets: 300 10*3/uL (ref 150–400)
RBC: 4.56 MIL/uL (ref 3.87–5.11)
RDW: 14.4 % (ref 11.5–15.5)
WBC: 10.9 10*3/uL — ABNORMAL HIGH (ref 4.0–10.5)
nRBC: 0 % (ref 0.0–0.2)

## 2023-10-27 LAB — BETA-2-GLYCOPROTEIN I ABS, IGG/M/A
Beta-2 Glyco I IgG: <9 GPI IgG units (ref 0–20)
Beta-2-Glycoprotein I IgA: <9 GPI IgA units (ref 0–25)
Beta-2-Glycoprotein I IgM: <9 GPI IgM units (ref 0–32)

## 2023-10-27 LAB — BASIC METABOLIC PANEL
Anion gap: 9 (ref 5–15)
BUN: 7 mg/dL — ABNORMAL LOW (ref 8–23)
CO2: 27 mmol/L (ref 22–32)
Calcium: 8.9 mg/dL (ref 8.9–10.3)
Chloride: 105 mmol/L (ref 98–111)
Creatinine, Ser: 0.86 mg/dL (ref 0.44–1.00)
GFR, Estimated: >60 mL/min (ref >60)
Glucose, Bld: 114 mg/dL — ABNORMAL HIGH (ref 70–99)
Potassium: 3.5 mmol/L (ref 3.5–5.1)
Sodium: 141 mmol/L (ref 135–145)

## 2023-10-27 LAB — CARDIOLIPIN ANTIBODIES, IGG, IGM, IGA
Anticardiolipin IgA: <9 [APL'U]/mL (ref 0–11)
Anticardiolipin IgG: <9 [GPL'U]/mL (ref 0–14)
Anticardiolipin IgM: 20 [MPL'U]/mL — ABNORMAL HIGH (ref 0–12)

## 2023-10-27 LAB — PROTEIN S, TOTAL: Protein S Ag, Total: 91 % (ref 60–150)

## 2023-10-27 LAB — PROTEIN S ACTIVITY: Protein S Activity: 98 % (ref 63–140)

## 2023-10-27 LAB — HOMOCYSTEINE: Homocysteine: 9.7 umol/L (ref 0.0–17.2)

## 2023-10-27 LAB — PROTEIN C ACTIVITY: Protein C Activity: 120 % (ref 73–180)

## 2023-10-27 MED ORDER — ONDANSETRON HCL 4 MG/2ML IJ SOLN
4.0000 mg | Freq: Once | INTRAMUSCULAR | Status: DC
  Filled 2023-10-27: qty 2

## 2023-10-27 MED ORDER — APIXABAN 5 MG PO TABS: 5.0000 mg | ORAL_TABLET | Freq: Two times a day (BID) | ORAL | 1 refills | Status: DC

## 2023-10-27 MED ORDER — MORPHINE SULFATE (PF) 4 MG/ML IV SOLN
4.0000 mg | Freq: Once | INTRAVENOUS | Status: DC
  Filled 2023-10-27: qty 1

## 2023-10-27 MED ORDER — GABAPENTIN 100 MG PO CAPS: 100.0000 mg | ORAL_CAPSULE | Freq: Three times a day (TID) | ORAL | 0 refills | Status: AC | PRN

## 2023-10-27 NOTE — Assessment & Plan Note (Signed)
Diagnosed on 12/8 CTA. Satting well on RA, but having difficulty with uncontrolled pain.  From history, it appears that this PE is unprovoked, thus I will send to hematology for further workup.  Patient is up-to-date on recommended malignancy screenings (mammo and colonoscopy).  Will add gabapentin for additional pain control.  - Continue eliquis starter pack. Provided refill for eliquis 5mg  BID x 2 months (60 days). This should be a total of 3 months for PE treatment.  - Gabapentin 100mg  TID prn for pain.  Advised to start with a nightly dose at first to see if she can tolerate drowsiness -Continue lidocaine patch and Tylenol as needed for pain control -BP elevated today, most likely related to pain.  Will have patient follow-up in 2 weeks to reassess pain and BP.

## 2023-10-27 NOTE — ED Provider Notes (Addendum)
[place] EMERGENCY DEPARTMENT AT [facility] Provider Note   CSN: [id number] Arrival date & time: 10/27/23  4098     History  Chief Complaint  Patient presents with   Shortness of Breath   Right rib pain     Janet Woods is a 67 y.o. female.  Patient presents to the emergency department with increasing shortness of breath and pain in the right chest.  Patient comes to the emergency department by ambulance.  EMS report room air oxygen saturations of 88%.  Oxygen saturations improved with supplemental oxygen by nasal cannula at 4 L.  She does not normally wear oxygen.  Patient was diagnosed with PE yesterday.  She took her Eliquis at 7 PM.       Home Medications Prior to Admission medications   Medication Sig Start Date End Date Taking? Authorizing Provider  APIXABAN Everlene Balls) VTE STARTER PACK (10MG  AND 5MG ) Take as directed on package: start with two-5mg  tablets twice daily for 7 days. On day 8, switch to one-5mg  tablet twice daily. 10/26/23   Antony Madura, PA-C  cetirizine (ZYRTEC) 10 MG tablet TAKE 1 TABLET(10 MG) BY MOUTH DAILY Patient taking differently: Take 10 mg by mouth daily as needed for allergies. 02/07/22   Simmons-Robinson, Makiera, MD  fluticasone (FLONASE) 50 MCG/ACT nasal spray Place 1 spray into both nostrils daily as needed for allergies or rhinitis.    [provider]  hydrochlorothiazide (HYDRODIURIL) 25 MG tablet TAKE 1 TABLET(25 MG) BY MOUTH DAILY 07/03/23   Erick Alley, DO  lidocaine (LIDODERM) 5 % Place 1 patch onto the skin daily. Remove & Discard patch within 12 hours or as directed by MD Patient taking differently: Place 1 patch onto the skin daily as needed (back/shoulder pain). Remove & Discard patch within 12 hours or as directed by MD 11/25/18   Petrucelli, Lelon Mast R, PA-C  MAXITROL 3.5-10000-0.1 OINT Place 1 Application into both eyes 2 (two) times daily. 05/19/23   [provider]  metFORMIN (GLUCOPHAGE-XR) 500 MG 24 hr  tablet Take 1 tablet (500 mg total) by mouth 2 (two) times daily with a meal. 06/30/23   Erick Alley, DO  oxyCODONE-acetaminophen (PERCOCET) 5-325 MG tablet Take 1 tablet by mouth every 4 (four) hours as needed. 10/26/23   Gilda Crease, MD  Polyethyl Glycol-Propyl Glycol (LUBRICATING EYE DROPS OP) Place 1 drop into both eyes daily as needed (dry eyes).    [provider]  Vitamin D, Ergocalciferol, (DRISDOL) 1.25 MG (50000 UNIT) CAPS capsule Take 1 capsule (50,000 Units total) by mouth every 7 (seven) days. 09/30/22   Erick Alley, DO      Allergies    Patient has no known allergies.    Review of Systems   Review of Systems  Physical Exam Updated Vital Signs BP (!) 159/92 (BP Location: Right Arm)   Pulse 82   Temp 99 F (37.2 C) (Oral)   Resp 16   SpO2 99%  Physical Exam Vitals and nursing note reviewed.  Constitutional:      General: She is not in acute distress.    Appearance: She is well-developed.  HENT:     Head: Normocephalic and atraumatic.     Mouth/Throat:     Mouth: Mucous membranes are moist.  Eyes:     General: Vision grossly intact. Gaze aligned appropriately.     Extraocular Movements: Extraocular movements intact.     Conjunctiva/sclera: Conjunctivae normal.  Cardiovascular:     Rate and Rhythm: Normal rate  and regular rhythm.     Pulses: Normal pulses.     Heart sounds: Normal heart sounds, S1 normal and S2 normal. No murmur heard.    No friction rub. No gallop.  Pulmonary:     Effort: Pulmonary effort is normal. No respiratory distress.     Breath sounds: Normal breath sounds.  Abdominal:     General: Bowel sounds are normal.     Palpations: Abdomen is soft.     Tenderness: There is no abdominal tenderness. There is no guarding or rebound.     Hernia: No hernia is present.  Musculoskeletal:        General: No swelling.     Cervical back: Full passive range of motion without pain, normal range of motion and neck supple. No spinous  process tenderness or muscular tenderness. Normal range of motion.     Right lower leg: No edema.     Left lower leg: No edema.  Skin:    General: Skin is warm and dry.     Capillary Refill: Capillary refill takes less than 2 seconds.     Findings: No ecchymosis, erythema, rash or wound.  Neurological:     General: No focal deficit present.     Mental Status: She is alert and oriented to person, place, and time.     GCS: GCS eye subscore is 4. GCS verbal subscore is 5. GCS motor subscore is 6.     Cranial Nerves: Cranial nerves 2-12 are intact.     Sensory: Sensation is intact.     Motor: Motor function is intact.     Coordination: Coordination is intact.  Psychiatric:        Attention and Perception: Attention normal.        Mood and Affect: Mood normal.        Speech: Speech normal.        Behavior: Behavior normal.     ED Results / Procedures / Treatments   Labs (all labs ordered are listed, but only abnormal results are displayed) Labs Reviewed  CBC WITH DIFFERENTIAL/PLATELET - Abnormal; Notable for the following components:      Result Value   WBC 10.9 (*)    Neutro Abs 7.8 (*)    All other components within normal limits  BASIC METABOLIC PANEL - Abnormal; Notable for the following components:   Glucose, Bld 114 (*)    BUN 7 (*)    All other components within normal limits  TROPONIN I (HIGH SENSITIVITY)    EKG EKG Interpretation Date/Time:  Tuesday October 27 2023 03:48:05 EST Ventricular Rate:  81 PR Interval:  168 QRS Duration:  95 QT Interval:  391 QTC Calculation: 454 R Axis:   -25  Text Interpretation: Sinus rhythm Probable left ventricular hypertrophy Confirmed by Gilda Crease [phone]) on 10/27/2023 4:30:31 AM  Radiology DG Chest Port 1 View  Result Date: 10/27/2023 CLINICAL DATA:  Shortness of breath. EXAM: PORTABLE CHEST 1 VIEW COMPARISON:  October 25, 2023 FINDINGS: The cardiac silhouette is mildly enlarged and unchanged in size. There is  mild central prominence of the pulmonary vasculature. Mild atelectasis is seen within the bilateral lung bases. No pleural effusion or pneumothorax is identified. The visualized skeletal structures are unremarkable. IMPRESSION: Mild cardiomegaly with mild bibasilar atelectasis. Electronically Signed   By: Aram Candela M.D.   On: 10/27/2023 04:12   CT Angio Chest PE W and/or Wo Contrast  Result Date: 10/25/2023 CLINICAL DATA:  Right-sided chest pain, initial encounter  EXAM: CT ANGIOGRAPHY CHEST WITH CONTRAST TECHNIQUE: Multidetector CT imaging of the chest was performed using the standard protocol during bolus administration of intravenous contrast. Multiplanar CT image reconstructions and MIPs were obtained to evaluate the vascular anatomy. RADIATION DOSE REDUCTION: This exam was performed according to the departmental dose-optimization program which includes automated exposure control, adjustment of the mA and/or kV according to patient size and/or use of iterative reconstruction technique. CONTRAST:  75mL OMNIPAQUE IOHEXOL 350 MG/ML SOLN COMPARISON:  Chest x-ray from earlier in the same day. FINDINGS: Cardiovascular: Thoracic aorta is well visualized without significant atherosclerotic calcifications. No aneurysmal dilatation or dissection is seen. Heart is not significantly enlarged. No coronary calcifications are noted. The pulmonary artery shows a normal branching pattern bilaterally. Small filling defect is noted within the right lower lobe best seen on images 65 and 67 of series 7 consistent with small segmental pulmonary embolus. No right heart strain is noted. No other filling defects are seen. Mediastinum/Nodes: Esophagus is within normal limits. No hilar or mediastinal adenopathy is noted. The thoracic inlet is within normal limits. Lungs/Pleura: Lungs are well aerated bilaterally. No focal infiltrate or effusion is seen. Upper Abdomen: Upper abdomen is within normal limits. Musculoskeletal: No  chest wall abnormality. No acute or significant osseous findings. Review of the MIP images confirms the above findings. IMPRESSION: Small right lower lobe pulmonary embolus without significant right heart strain. No other focal abnormality is noted. Critical Value/emergent results were called by telephone at the time of interpretation on 10/25/2023 at 9:27 pm to Dr. Gloris Manchester , who verbally acknowledged these results. Electronically Signed   By: Alcide Clever M.D.   On: 10/25/2023 21:29   DG Chest 2 View  Result Date: 10/25/2023 CLINICAL DATA:  Shortness of breath and left-sided chest pain, initial EN EXAM: CHEST - 2 VIEW COMPARISON:  05/04/2021 FINDINGS: Cardiac shadow is stable. Lungs are well aerated bilaterally. Mild basilar atelectasis is seen. No focal confluent infiltrate or effusion is noted. No bony abnormality is noted. IMPRESSION: Mild bibasilar atelectasis. Electronically Signed   By: Alcide Clever M.D.   On: 10/25/2023 20:07    Procedures Procedures    Medications Ordered in ED Medications  morphine (PF) 4 MG/ML injection 4 mg (4 mg Intravenous Not Given 10/27/23 0452)  ondansetron (ZOFRAN) injection 4 mg (4 mg Intravenous Not Given 10/27/23 1610)    ED Course/ Medical Decision Making/ A&P                                 Medical Decision Making Amount and/or Complexity of Data Reviewed Labs: ordered. Radiology: ordered.  Risk Prescription drug management. Decision regarding hospitalization.   Patient with known PE presents to the emergency department with right-sided chest pain, shortness of breath.  Patient reports pain worsened tonight.  She took her oxycodone but it did not help.  She started having increasing shortness of breath.  Room air oxygen saturations were documented at 88%.  She does not normally use oxygen.  She was brought to the ER on supplemental oxygen.  EKG unchanged.  Troponin normal, doubt heart strain.  Will convert to heparin and admit to hospital for  pain control.   Addendum: Patient seen and evaluated by family practice team.  Patient now off of oxygen doing well.  She does not want to be admitted.  I suspect that her hypoxia earlier was secondary to splinting secondary to pain.  She has follow-up  scheduled in the office tomorrow.     Final Clinical Impression(s) / ED Diagnoses Final diagnoses:  Other acute pulmonary embolism without acute cor pulmonale (HCC)    Rx / DC Orders ED Discharge Orders     None         Ether Wolters, Canary Brim, MD 10/27/23 0502    Gilda Crease, MD 10/27/23 [phone]

## 2023-10-27 NOTE — H&P (Signed)
Hospital Admission History and Physical Service Pager: [phone]  Patient name: Janet Woods Medical record number: [id number] Date of Birth: 01/19/1956 Age: 67 y.o. Gender: female  Primary Care Provider: Erick Alley, DO Consultants: None Code Status: Full which was confirmed with patient Preferred Emergency Contact:  Contact Information     Name Relation Home Work [place] Daughter [phone]  [phone]      Other Contacts     Name Relation Home Work Mobile   Rakin,Bernard Other   [phone]        Chief Complaint: rib pain  Assessment and Plan: Janet Woods is a 67 y.o. female presenting with right-sided rib pain.  Patient has known small right lower lobe PE that was diagnosed on 10/25/2023.  Given her stability and no oxygen requirement, she was discharged at that time on Eliquis.  Ultimately, shared decision making was used and patient decided to return home and follow-up in clinic this afternoon.  Assessment & Plan Other acute pulmonary embolism without acute cor pulmonale (HCC) Diagnosed via CTA chest on 12/8 which reflected small right lower lobe PE without significant heart strain.  Discharged from ED on 12/8 with Eliquis starter pack.  She has been fully compliant with this.  She remained stable with exception of brief episode of pain and then shortness of breath following use of oxycodone.  In the ED, she remains stable, without shortness of breath or pain.  Offered admission for purposes of monitoring versus return home and follow-up in the clinic this afternoon for her already booked appointment.  She was monitored while off oxygen.  She had appropriate wave capnography and O2 sat ranged from 93 to 97%.  Ultimately, patient opted to return home which I continue to feel is safe given her current clinical course. -Discharge home, follow-up in clinic for current appointment with Dr. Sherrilee Gilles @1450  -Continue Eliquis -Patient has opted to  discontinue her oxycodone; may use Tylenol or ibuprofen discussed appropriate dosages  FEN/GI: N/A VTE Prophylaxis: N/A  Disposition: Home  History of Present Illness:  Janet Woods is a 67 y.o. female presenting with right-sided rib pain.  She notes the last night she was having sharp pain in her right side and some shortness of breath that felt different than the day prior.  She took her oxycodone which did not make her feel better.  She then called the ambulance to proceed to the ED.  She has felt better since being here.  EMS reported room air O2 sats on 88% however she was placed on 4 L with improvement of her O2 sat.  She does not normally wear oxygen.  Since being in the ED, pain had resolved without intervention and she denies shortness of breath.  She notes she last took her Eliquis at 7pm.   In the ED, she did not receive any interventional measures.  Blood work was obtained along with chest x-ray.  Review Of Systems: Per HPI  Pertinent Past Medical History: HTN  T2DM Remainder reviewed in history tab.   Pertinent Past Surgical History: D&C/hysteroscopy Remainder reviewed in history tab.  Pertinent Social History: Not discussed  Pertinent Family History: Mother: Heart disease Sister: Lung cancer Remainder reviewed in history tab.   Important Outpatient Medications: Hydrochlorothiazide 25mg  daily Eliquis 10 mg twice daily x 1 week, 5 mg twice daily afterwards Remainder reviewed in medication history.   Objective: BP (!) 159/92 (BP Location: Right Arm)   Pulse 82   Temp  99 F (37.2 C) (Oral)   Resp 16   SpO2 99%  Exam: General: Well-appearing, NAD Neck: No JVD Cardiovascular: RRR, no murmurs auscultated Respiratory: CTAB, normal WOB on room air Gastrointestinal: Soft, nontender, normoactive bowel sounds MSK: No pitting edema BLEs Neuro: ANO x 4, appropriately conversational, no focal neurological deficits Psych: Normal mood and affect  Labs:  CBC  BMET  Recent Labs  Lab 10/27/23 0347  WBC 10.9*  HGB 12.7  HCT 40.1  PLT 300   Recent Labs  Lab 10/27/23 0347  NA 141  K 3.5  CL 105  CO2 27  BUN 7*  CREATININE 0.86  GLUCOSE 114*  CALCIUM 8.9     Troponin 5  EKG: NSR, normal QRS and PR intervals, no appreciable T wave inversion or ST elevation  Imaging Studies Performed: CXR 12/10: Mild cardiomegaly with mild bibasilar atelectasis CTA chest 12/8: Small RLL PE without significant right heart strain  Shelby Mattocks, DO 10/27/2023, 5:21 AM PGY-3, [facility]  FPTS Intern pager: [phone], text pages welcome Secure chat group [facility] Teaching Service

## 2023-10-27 NOTE — Progress Notes (Signed)
    SUBJECTIVE:   CHIEF COMPLAINT / HPI:   VM is a 67yo F w/ hx of T2DM, PE that p/f ED f/u - Starting around the 12/6, woke up and started to feel SOB. Also having R sided chest pain. Discharged on Eliquis for PE noted on CTA. - Then went back to ED on 12/9 because she felt like something was stuck in throat and couldn't cough it up.  Patient was satting well on room air, and was discharged and recommended follow-up in clinic. - Denies fever, SOB, leg swelling or leg pain -No history of DVT or PE - No recent surgery, travel, immobilization. No malignancy - Does not take estrogen containing meds.  - Does not smoke.  - Denies hemoptysis.  For pain, taking: - tylenol - lidocaine patch - was given oxy, but it made her nauseous.    - Mammogram next Monday - UTD on colonoscopy  OBJECTIVE:   BP (!) 146/85   Pulse 89   Ht 5\' 6"  (1.676 m)   Wt 249 lb 9.6 oz (113.2 kg)   SpO2 100%   BMI 40.29 kg/m   General: Alert, uncomfortable but non-toxic appearing. NAD. HEENT: NCAT. MMM. CV: RRR, no murmurs. Resp: CTAB, no wheezing or crackles. Normal WOB on RA.  Abm: Soft, nontender, nondistended. BS present. Skin: Warm, well perfused  Msk: Tenderness to palpation along right flank, lateral to right breast. Est: 1+ BL pitting edema. No tenderness or erythema of legs.   ASSESSMENT/PLAN:   Assessment & Plan Other acute pulmonary embolism, unspecified whether acute cor pulmonale present (HCC) Diagnosed on 12/8 CTA. Satting well on RA, but having difficulty with uncontrolled pain.  From history, it appears that this PE is unprovoked, thus I will send to hematology for further workup.  Patient is up-to-date on recommended malignancy screenings (mammo and colonoscopy).  Will add gabapentin for additional pain control.  - Continue eliquis starter pack. Provided refill for eliquis 5mg  BID x 2 months (60 days). This should be a total of 3 months for PE treatment.  - Gabapentin 100mg  TID prn for  pain.  Advised to start with a nightly dose at first to see if she can tolerate drowsiness -Continue lidocaine patch and Tylenol as needed for pain control -BP elevated today, most likely related to pain.  Will have patient follow-up in 2 weeks to reassess pain and BP.     Lincoln Brigham, MD [facility]

## 2023-10-27 NOTE — Patient Instructions (Signed)
Good to see you today - Thank you for coming in  Things we discussed today:  1) For your pulmonary embolism, continue taking your Eliquis as directed on the box.  - After the first month, you should continue taking Eliquis 5mg  twice a day for another 2 months (3 months total) - I send you to see a hematologist.  Expect a phone call from them in the next few days to schedule.  They may instruct you to take your Eliquis for longer. -I am adding a medication called gabapentin.  Take 100 mg at bedtime to help with the pain.  It can make you drowsy, so take it at bedtime at first.  If it does not make you too drowsy, you can take it up to 3 times a day as needed for pain. -You to use your lidocaine patch and Tylenol as needed for pain.  You will likely take multiple medications to control the pain.  Come back in 2 weeks to reassess the pain and recheck your blood pressure.

## 2023-10-27 NOTE — ED Triage Notes (Signed)
Patient BIB EMS from home c/o SOB with right rib pain x2 days that worsen ago. SPO2 88% placed on 4iters increased to 98%. A&Ox4, 180/100, 86, CBG 121

## 2023-10-28 LAB — LUPUS ANTICOAGULANT PANEL
DRVVT: 29.3 s (ref 0.0–47.0)
PTT Lupus Anticoagulant: 32.1 s (ref 0.0–43.5)

## 2023-10-29 LAB — PROTEIN C, TOTAL: Protein C, Total: 92 % (ref 60–150)

## 2023-10-29 LAB — PROTHROMBIN GENE MUTATION

## 2023-10-30 LAB — FACTOR 5 LEIDEN

## 2023-11-02 ENCOUNTER — Ambulatory Visit
Admission: RE | Admit: 2023-11-02 | Discharge: 2023-11-02 | Disposition: A | Discharge status: Home / Self Care | Payer: Medicare HMO | Source: Ambulatory Visit | Attending: Family Medicine | Admitting: Family Medicine

## 2023-11-02 DIAGNOSIS — Z1231 Encounter for screening mammogram for malignant neoplasm of breast: Secondary | ICD-10-CM | POA: Diagnosis not present

## 2023-11-09 NOTE — Progress Notes (Signed)
Factor V Leiden and Prothrombin II F2 variant were ordered as part of the hypercoagulable panel in patient with new onset PE.

## 2023-11-11 NOTE — Progress Notes (Signed)
    SUBJECTIVE:   CHIEF COMPLAINT / HPI:   HTN Elevated at last visit in setting of pain related to recent PE (on eliquis). Pain has improved. Currently taking hydrochlorothiazide which she took last night.  Has been checking at home : running 130's-140's/80's  Globus sensation  Feels like something is stuck in throat for past 6-7 months. Has tried hot tea which does not help. Can swallow food and water without difficulty. No weight loss. Does use flonase prn. Request to go to ENT doctor   PERTINENT  PMH / PSH: PE, HTN, T2DM, mood disorder  OBJECTIVE:   BP 118/64   Pulse 80   Ht 5\' 6"  (1.676 m)   Wt 253 lb 6.4 oz (114.9 kg)   SpO2 97%   BMI 40.90 kg/m    General: NAD, pleasant, able to participate in exam HEENT: MMM, no erythema, edema or lesions in oral cavity or oropharynx.  No anterior, posterior, or submandibular cervical lymphadenopathy, masses, or pain with palpation Cardiac: RRR, no murmurs. Respiratory: CTAB, normal effort, No wheezes, rales or rhonchi Skin: warm and dry Neuro: alert, no obvious focal deficits Psych: Normal affect and mood  ASSESSMENT/PLAN:   Essential hypertension Well-controlled on repeat. -Continue HCTZ for now -Can consider switching to ACE or ARB in future for renal protection considering patient has T2DM  Globus sensation Fortunately weight is stable and can swallow without difficulty.  No concerning findings on physical exam.  Discussed that worsened anxiety can contribute to globus sensation and patient has been under lots of stress over the past year -Increase Flonase to daily -Referred to ENT per patient request which is appropriate -If no etiology found to ENT, patient to return to discuss other possible causes and treatment options    Dr. Erick Alley, DO [place] [facility]

## 2023-11-12 ENCOUNTER — Encounter: Payer: Self-pay | Admitting: Student

## 2023-11-12 ENCOUNTER — Ambulatory Visit (INDEPENDENT_AMBULATORY_CARE_PROVIDER_SITE_OTHER): Payer: Medicare HMO | Admitting: Student

## 2023-11-12 VITALS — BP 118/64 | HR 80 | Ht 66.0 in | Wt 253.4 lb

## 2023-11-12 DIAGNOSIS — I10 Essential (primary) hypertension: Secondary | ICD-10-CM

## 2023-11-12 DIAGNOSIS — R09A2 Foreign body sensation, throat: Secondary | ICD-10-CM

## 2023-11-12 NOTE — Patient Instructions (Addendum)
It was great to see you! Thank you for allowing me to participate in your care!  I recommend that you always bring your medications to each appointment as this makes it easy to ensure you are on the correct medications and helps [place] not miss when refills are needed.  Our plans for today:  - I referred you to ENT doctor. They will call to schedule apt - Return in about a month for diabetes check up    Take care and seek immediate care sooner if you develop any concerns.   Dr. Erick Alley, DO [facility]

## 2023-11-13 DIAGNOSIS — R09A2 Foreign body sensation, throat: Secondary | ICD-10-CM | POA: Insufficient documentation

## 2023-11-13 NOTE — Assessment & Plan Note (Signed)
Well-controlled on repeat. -Continue HCTZ for now -Can consider switching to ACE or ARB in future for renal protection considering patient has T2DM

## 2023-11-13 NOTE — Assessment & Plan Note (Signed)
Fortunately weight is stable and can swallow without difficulty.  No concerning findings on physical exam.  Discussed that worsened anxiety can contribute to globus sensation and patient has been under lots of stress over the past year -Increase Flonase to daily -Referred to ENT per patient request which is appropriate -If no etiology found to ENT, patient to return to discuss other possible causes and treatment options

## 2023-12-01 ENCOUNTER — Inpatient Hospital Stay: Payer: Medicare HMO | Attending: Oncology | Admitting: Oncology

## 2023-12-01 ENCOUNTER — Inpatient Hospital Stay: Payer: Medicare HMO

## 2023-12-01 ENCOUNTER — Encounter: Payer: Self-pay | Admitting: Oncology

## 2023-12-01 VITALS — BP 140/76 | HR 68 | Temp 97.7°F | Resp 18 | Ht 66.0 in | Wt 250.7 lb

## 2023-12-01 DIAGNOSIS — Z7901 Long term (current) use of anticoagulants: Secondary | ICD-10-CM | POA: Diagnosis not present

## 2023-12-01 DIAGNOSIS — I2699 Other pulmonary embolism without acute cor pulmonale: Secondary | ICD-10-CM

## 2023-12-01 DIAGNOSIS — R09A2 Foreign body sensation, throat: Secondary | ICD-10-CM

## 2023-12-01 DIAGNOSIS — Z0000 Encounter for general adult medical examination without abnormal findings: Secondary | ICD-10-CM

## 2023-12-01 LAB — CBC WITH DIFFERENTIAL/PLATELET
Abs Immature Granulocytes: 0.02 10*3/uL (ref 0.00–0.07)
Basophils Absolute: 0 10*3/uL (ref 0.0–0.1)
Basophils Relative: 0 %
Eosinophils Absolute: 0.1 10*3/uL (ref 0.0–0.5)
Eosinophils Relative: 2 %
HCT: 42.1 % (ref 36.0–46.0)
Hemoglobin: 13.5 g/dL (ref 12.0–15.0)
Immature Granulocytes: 0 %
Lymphocytes Relative: 30 %
Lymphs Abs: 2.2 10*3/uL (ref 0.7–4.0)
MCH: 28.1 pg (ref 26.0–34.0)
MCHC: 32.1 g/dL (ref 30.0–36.0)
MCV: 87.7 fL (ref 80.0–100.0)
Monocytes Absolute: 0.5 10*3/uL (ref 0.1–1.0)
Monocytes Relative: 7 %
Neutro Abs: 4.3 10*3/uL (ref 1.7–7.7)
Neutrophils Relative %: 61 %
Platelets: 293 10*3/uL (ref 150–400)
RBC: 4.8 MIL/uL (ref 3.87–5.11)
RDW: 14.1 % (ref 11.5–15.5)
WBC: 7.1 10*3/uL (ref 4.0–10.5)
nRBC: 0 % (ref 0.0–0.2)

## 2023-12-01 LAB — COMPREHENSIVE METABOLIC PANEL
ALT: 11 U/L (ref 0–44)
AST: 16 U/L (ref 15–41)
Albumin: 4.3 g/dL (ref 3.5–5.0)
Alkaline Phosphatase: 64 U/L (ref 38–126)
Anion gap: 5 (ref 5–15)
BUN: 9 mg/dL (ref 8–23)
CO2: 32 mmol/L (ref 22–32)
Calcium: 9.6 mg/dL (ref 8.9–10.3)
Chloride: 102 mmol/L (ref 98–111)
Creatinine, Ser: 0.75 mg/dL (ref 0.44–1.00)
GFR, Estimated: >60 mL/min (ref >60)
Glucose, Bld: 113 mg/dL — ABNORMAL HIGH (ref 70–99)
Potassium: 4 mmol/L (ref 3.5–5.1)
Sodium: 139 mmol/L (ref 135–145)
Total Bilirubin: 0.3 mg/dL (ref 0.0–1.2)
Total Protein: 8.6 g/dL — ABNORMAL HIGH (ref 6.5–8.1)

## 2023-12-01 LAB — D-DIMER, QUANTITATIVE: D-Dimer, Quant: <0.27 ug{FEU}/mL (ref 0.00–0.50)

## 2023-12-01 LAB — LACTATE DEHYDROGENASE: LDH: 145 U/L (ref 98–192)

## 2023-12-01 NOTE — Assessment & Plan Note (Signed)
 She has been referred to ENT by her PCP and has appointment with Dr. Irene Pap coming up on 12/17/2023.

## 2023-12-01 NOTE — Assessment & Plan Note (Addendum)
 Pulmonary embolism diagnosed in December 2024, likely triggered by prolonged immobility during a long road trip.  -She had testing done for hypercoagulable workup in the ED.  No evidence of factor V Leiden mutation, prothrombin gene mutation.  Protein C activity, protein S activity, Antithrombin III  activity were all within normal limits.  Beta-2  glycoprotein antibodies and lupus anticoagulant were negative.  Anticardiolipin IgM was indeterminate at 20.  This needs to be repeated at least 3 months later.  No underlying hypercoagulable condition identified.   -She has been compliant with Eliquis  5 mg twice daily and tolerating this well without major side effects.  -D-dimer is undetectable today, indicating adequate anticoagulation.  Plan to continue Eliquis  at current dosing for total of 6 months.  -Educated on importance of taking breaks at least every 2 hours when on long road trip or long flight.  She verbalized understanding.  She was also advised to maintain adequate hydration.  -I will see her in mid June 2025 with repeat labs including anticardiolipin antibodies.

## 2023-12-01 NOTE — Progress Notes (Addendum)
 [facility]  HEMATOLOGY CLINIC CONSULTATION NOTE   PATIENT NAME: Janet Woods   MR#: [id number] DOB: 1956-01-22  DATE OF SERVICE: 12/01/2023   [facility] follow up  Patient Care Team: Joshua Domino, DO as PCP - General (Family Medicine)   REASON FOR CONSULTATION/ CHIEF COMPLAINT:  Pulmonary embolism, diagnosed in December 2024.   ASSESSMENT & PLAN:  Janet Woods is a 68 y.o. lady with a past medical history of Type 2 DM, HTN, arthritis, was referred to our service for evaluation after she was found to have right sided pulmonary embolism.    Acute pulmonary embolism (HCC) Pulmonary embolism diagnosed in December 2024, likely triggered by prolonged immobility during a long road trip.  -She had testing done for hypercoagulable workup in the ED.  No evidence of factor V Leiden mutation, prothrombin gene mutation.  Protein C activity, protein S activity, Antithrombin III  activity were all within normal limits.  Beta-2  glycoprotein antibodies and lupus anticoagulant were negative.  Anticardiolipin IgM was indeterminate at 20.  This needs to be repeated at least 3 months later.  No underlying hypercoagulable condition identified.   -She has been compliant with Eliquis  5 mg twice daily and tolerating this well without major side effects.  -D-dimer is undetectable today, indicating adequate anticoagulation.  Plan to continue Eliquis  at current dosing for total of 6 months.  -Educated on importance of taking breaks at least every 2 hours when on long road trip or long flight.  She verbalized understanding.  She was also advised to maintain adequate hydration.  -I will see her in mid June 2025 with repeat labs including anticardiolipin antibodies.  Healthcare maintenance -She is up-to-date on mammogram.  -Last colonoscopy was 3 years ago which showed benign polyps and patient is due to receive another one later this year.  Globus  sensation She has been referred to ENT by her PCP and has appointment with Dr. Okey coming up on 12/17/2023.   I reviewed lab results and outside records for this visit and discussed relevant results with the patient. Diagnosis, plan of care and treatment options were also discussed in detail with the patient. Opportunity provided to ask questions and answers provided to her apparent satisfaction. Provided instructions to call our clinic with any problems, questions or concerns prior to return visit. I recommended to continue follow-up with PCP and sub-specialists. She verbalized understanding and agreed with the plan. No barriers to learning was detected.  [name], MD  12/01/2023 11:13 AM  [facility] - A DEPT OF [facility] [address] [place] [address] Dept: [phone] Dept Fax: [phone]   HISTORY OF PRESENT ILLNESS:  Discussed the use of AI scribe software for clinical note transcription with the patient, who gave verbal consent to proceed.   She underwent robotic assisted total hysterectomy and bilateral salpingo-oophorectomy in July 2024.She reports no bleeding prior to the hysterectomy and the procedure was performed due to an aging cervix. Postoperatively, she was discharged the same day and did well until she presented to the emergency room twice in December 2024 with back pain. She was not admitted but underwent CT angiogram of the chest on 10/25/2023 which showed a small right lower lobe pulmonary embolus without significant right heart strain.  She was started on Eliquis  for the PE. She reports no chest pain but has had previous issues with sciatica. 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 She took a long road trip to [place]  and back after Thanksgiving, stopping only once or twice at rest stops for breaks. She reports no leg pain or redness.  She also reports globus  sensation in her throat and has been referred to ENT and has an appointment with Dr. Soldatova on 12/17/2023.  She denies fever, cough, diarrhea, or other infectious symptoms.  She denies epistaxis, bloody stool, melena, hematuria, bruising or other bleeding symptoms. She also denies unintentional weight loss, night sweats or other constitutional symptoms.  MEDICAL HISTORY Past Medical History:  Diagnosis Date   Arthritis    Carpal tunnel syndrome on both sides    Full dentures    Hypertension    Type 2 diabetes mellitus (HCC)    followed by pcp    (01-12-2023 per pt pcp note in epic DM2 ,however , per pt does have diabetes anymore just takes metformen to lose weight)   Uterine mass      SURGICAL HISTORY Past Surgical History:  Procedure Laterality Date   COLONOSCOPY WITH PROPOFOL   04/22/2021   dr sharlyn   DILATATION & CURETTAGE/HYSTEROSCOPY WITH MYOSURE N/A 01/14/2023   Procedure: DILATATION & CURETTAGE/HYSTEROSCOPY WITH MYOSURE;  Surgeon: Viktoria Comer SAUNDERS, MD;  Location: [facility];  Service: Gynecology;  Laterality: N/A;   MULTIPLE TOOTH EXTRACTIONS     in [place]    ROBOTIC ASSISTED TOTAL HYSTERECTOMY WITH BILATERAL SALPINGO OOPHERECTOMY Bilateral 06/17/2023   Procedure: XI ROBOTIC ASSISTED TOTAL HYSTERECTOMY WITH BILATERAL SALPINGO OOPHORECTOMY;  Surgeon: Viktoria Comer SAUNDERS, MD;  Location: [facility];  Service: Gynecology;  Laterality: Bilateral;     SOCIAL HISTORY: She reports that she has never smoked. She has never used smokeless tobacco. She reports that she does not drink alcohol and does not use drugs. Social History   Socioeconomic History   Marital status: Single    Spouse name: Not on file   Number of children: Not on file   Years of education: Not on file   Highest education level: Not on file  Occupational History   Occupation: retired  Tobacco Use   Smoking status: Never   Smokeless tobacco: Never  Vaping Use   Vaping status: Never Used   Substance and Sexual Activity   Alcohol use: No   Drug use: Never   Sexual activity: Not on file  Other Topics Concern   Not on file  Social History Narrative   Lives with daughter with CP Quentin Abu age 29).  No long term relationship.  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   Social Drivers of Corporate Investment Banker Strain: Low Risk  (07/07/2023)   Overall Financial Resource Strain (CARDIA)    Difficulty of Paying Living Expenses: Not hard at all  Food Insecurity: No Food Insecurity (12/01/2023)   Hunger Vital Sign    Worried About Running Out of Food in the Last Year: Never true    Ran Out of Food in the Last Year: Never true  Transportation Needs: No Transportation Needs (12/01/2023)   PRAPARE - Administrator, Civil Service (Medical): No    Lack of Transportation (Non-Medical): No  Physical Activity: Insufficiently Active (07/07/2023)   Exercise Vital Sign    Days of Exercise per Week: 3 days    Minutes of Exercise per Session: 30 min  Stress: No Stress Concern Present (07/07/2023)   [employer] of Occupational Health - Occupational Stress Questionnaire    Feeling of  Stress : Not at all  Social Connections: Moderately Isolated (07/07/2023)   Social Connection and Isolation Panel [NHANES]    Frequency of Communication with Friends and Family: More than three times a week    Frequency of Social Gatherings with Friends and Family: Three times a week    Attends Religious Services: More than 4 times per year    Active Member of Clubs or Organizations: No    Attends Banker Meetings: Never    Marital Status: Never married  Intimate Partner Violence: Not At Risk (12/01/2023)   Humiliation, Afraid, Rape, and Kick questionnaire    Fear of Current or Ex-Partner: No    Emotionally Abused: No    Physically Abused: No    Sexually Abused: No    FAMILY HISTORY: Her family history includes Cancer (age of onset: 45) in her sister; Heart disease in  her maternal grandfather; Heart disease (age of onset: 45) in her mother; Stroke in her daughter and maternal grandmother.  CURRENT MEDICATIONS   Current Outpatient Medications  Medication Instructions   APIXABAN  (ELIQUIS ) VTE STARTER PACK (10MG  AND 5MG ) Take as directed on package: start with two-5mg  tablets twice daily for 7 days. On day 8, switch to one-5mg  tablet twice daily.   apixaban  (ELIQUIS ) 5 mg, Oral, 2 times daily   cetirizine  (ZYRTEC ) 10 MG tablet TAKE 1 TABLET(10 MG) BY MOUTH DAILY   fluticasone  (FLONASE ) 50 MCG/ACT nasal spray 1 spray, Daily PRN   gabapentin  (NEURONTIN ) 100 mg, Oral, 3 times daily PRN   hydrochlorothiazide  (HYDRODIURIL ) 25 MG tablet TAKE 1 TABLET(25 MG) BY MOUTH DAILY   lidocaine  (LIDODERM ) 5 % 1 patch, Transdermal, Every 24 hours, Remove & Discard patch within 12 hours or as directed by MD   MAXITROL 3.5-10000-0.1 OINT 1 Application, 2 times daily   metFORMIN  (GLUCOPHAGE -XR) 500 mg, Oral, 2 times daily with meals   oxyCODONE -acetaminophen  (PERCOCET) 5-325 MG tablet 1 tablet, Oral, Every 4 hours PRN   Polyethyl Glycol-Propyl Glycol (LUBRICATING EYE DROPS OP) 1 drop, Daily PRN   Vitamin D  (Ergocalciferol ) (DRISDOL ) 50,000 Units, Oral, Every 7 days     ALLERGIES  She has no known allergies.  REVIEW OF SYSTEMS:  Review of Systems - Oncology   Rest of the pertinent review of systems is unremarkable except as mentioned above in HPI.  PHYSICAL EXAMINATION:  ECOG PERFORMANCE STATUS: 0 - Asymptomatic  Vitals:   12/01/23 0839  BP: (!) 140/76  Pulse: 68  Resp: 18  Temp: 97.7 F (36.5 C)  SpO2: 98%   Filed Weights   12/01/23 0839  Weight: 250 lb 11.2 oz (113.7 kg)    Physical Exam Constitutional:      General: She is not in acute distress.    Appearance: Normal appearance.  HENT:     Head: Normocephalic and atraumatic.  Eyes:     General: No scleral icterus.    Conjunctiva/sclera: Conjunctivae normal.  Cardiovascular:     Rate and Rhythm:  Normal rate and regular rhythm.     Heart sounds: Normal heart sounds.  Pulmonary:     Effort: Pulmonary effort is normal.     Breath sounds: Normal breath sounds.  Abdominal:     General: There is no distension.  Musculoskeletal:     Right lower leg: No edema.     Left lower leg: No edema.  Neurological:     General: No focal deficit present.     Mental Status: She is alert and oriented to person, place,  and time.  Psychiatric:        Mood and Affect: Mood normal.        Behavior: Behavior normal.        Thought Content: Thought content normal.      LABORATORY DATA:   I have reviewed the data as listed.  Results for orders placed or performed in visit on 12/01/23  D-dimer, quantitative  Result Value Ref Range   D-Dimer, Quant <0.27 0.00 - 0.50 ug/mL-FEU  Lactate dehydrogenase  Result Value Ref Range   LDH 145 98 - 192 U/L  Comprehensive metabolic panel  Result Value Ref Range   Sodium 139 135 - 145 mmol/L   Potassium 4.0 3.5 - 5.1 mmol/L   Chloride 102 98 - 111 mmol/L   CO2 32 22 - 32 mmol/L   Glucose, Bld 113 (H) 70 - 99 mg/dL   BUN 9 8 - 23 mg/dL   Creatinine, Ser 9.24 0.44 - 1.00 mg/dL   Calcium  9.6 8.9 - 10.3 mg/dL   Total Protein 8.6 (H) 6.5 - 8.1 g/dL   Albumin 4.3 3.5 - 5.0 g/dL   AST 16 15 - 41 U/L   ALT 11 0 - 44 U/L   Alkaline Phosphatase 64 38 - 126 U/L   Total Bilirubin 0.3 0.0 - 1.2 mg/dL   GFR, Estimated >39 >39 mL/min   Anion gap 5 5 - 15  CBC with Differential/Platelet  Result Value Ref Range   WBC 7.1 4.0 - 10.5 K/uL   RBC 4.80 3.87 - 5.11 MIL/uL   Hemoglobin 13.5 12.0 - 15.0 g/dL   HCT 57.8 63.9 - 53.9 %   MCV 87.7 80.0 - 100.0 fL   MCH 28.1 26.0 - 34.0 pg   MCHC 32.1 30.0 - 36.0 g/dL   RDW 85.8 88.4 - 84.4 %   Platelets 293 150 - 400 K/uL   nRBC 0.0 0.0 - 0.2 %   Neutrophils Relative % 61 %   Neutro Abs 4.3 1.7 - 7.7 K/uL   Lymphocytes Relative 30 %   Lymphs Abs 2.2 0.7 - 4.0 K/uL   Monocytes Relative 7 %   Monocytes Absolute 0.5  0.1 - 1.0 K/uL   Eosinophils Relative 2 %   Eosinophils Absolute 0.1 0.0 - 0.5 K/uL   Basophils Relative 0 %   Basophils Absolute 0.0 0.0 - 0.1 K/uL   Immature Granulocytes 0 %   Abs Immature Granulocytes 0.02 0.00 - 0.07 K/uL     RADIOGRAPHIC STUDIES:  I have personally reviewed the radiological images as listed and agreed with the findings in the report.  MM 3D SCREENING MAMMOGRAM BILATERAL BREAST Result Date: 11/03/2023 CLINICAL DATA:  Screening. EXAM: DIGITAL SCREENING BILATERAL MAMMOGRAM WITH TOMOSYNTHESIS AND CAD TECHNIQUE: Bilateral screening digital craniocaudal and mediolateral oblique mammograms were obtained. Bilateral screening digital breast tomosynthesis was performed. The images were evaluated with computer-aided detection. COMPARISON:  Previous exam(s). ACR Breast Density Category b: There are scattered areas of fibroglandular density. FINDINGS: There are no findings suspicious for malignancy. IMPRESSION: No mammographic evidence of malignancy. A result letter of this screening mammogram will be mailed directly to the patient. RECOMMENDATION: Screening mammogram in one year. (Code:SM-B-01Y) BI-RADS CATEGORY  1: Negative. Electronically Signed   By: Debby Satterfield M.D.   On: 11/03/2023 09:41    Orders Placed This Encounter  Procedures   CBC with Differential/Platelet    Standing Status:   Future    Number of Occurrences:   1    Expiration  Date:   11/30/2024   Comprehensive metabolic panel    Standing Status:   Future    Number of Occurrences:   1    Expiration Date:   11/30/2024   Lactate dehydrogenase    Standing Status:   Future    Number of Occurrences:   1    Expiration Date:   11/30/2024   D-dimer, quantitative    Standing Status:   Future    Number of Occurrences:   1    Expiration Date:   11/30/2024    Future Appointments  Date Time Provider Department Center  12/17/2023  8:00 AM Okey Burns, MD [facility] None  12/22/2023  8:30 AM Joshua Domino, DO  [facility]  01/04/2024  7:30 AM [facility] DX DEXA 1 [facility] GI-BREAST CE  05/02/2024 10:45 AM [facility] PHLEBOTOMIST [facility] None  05/02/2024 11:00 AM Aunna Snooks, Chinita, MD [facility] None  07/11/2024 10:15 AM [facility] ANNUAL WELLNESS VISIT [facility]    I spent a total of 55 minutes during this encounter with the patient including review of chart and various tests results, discussions about plan of care and coordination of care plan.  This document was completed utilizing speech recognition software. Grammatical errors, random word insertions, pronoun errors, and incomplete sentences are an occasional consequence of this system due to software limitations, ambient noise, and hardware issues. Any formal questions or concerns about the content, text or information contained within the body of this dictation should be directly addressed to the provider for clarification.

## 2023-12-01 NOTE — Assessment & Plan Note (Signed)
-  She is up-to-date on mammogram.  -Last colonoscopy was 3 years ago which showed benign polyps and patient is due to receive another one later this year.

## 2023-12-17 ENCOUNTER — Encounter (INDEPENDENT_AMBULATORY_CARE_PROVIDER_SITE_OTHER): Payer: Self-pay | Admitting: Otolaryngology

## 2023-12-17 ENCOUNTER — Ambulatory Visit (INDEPENDENT_AMBULATORY_CARE_PROVIDER_SITE_OTHER): Payer: Medicare HMO | Admitting: Otolaryngology

## 2023-12-17 VITALS — BP 174/99 | HR 74 | Ht 67.0 in | Wt 243.0 lb

## 2023-12-17 DIAGNOSIS — R0982 Postnasal drip: Secondary | ICD-10-CM | POA: Diagnosis not present

## 2023-12-17 DIAGNOSIS — R070 Pain in throat: Secondary | ICD-10-CM

## 2023-12-17 DIAGNOSIS — J351 Hypertrophy of tonsils: Secondary | ICD-10-CM | POA: Diagnosis not present

## 2023-12-17 DIAGNOSIS — J3089 Other allergic rhinitis: Secondary | ICD-10-CM | POA: Diagnosis not present

## 2023-12-17 DIAGNOSIS — R09A2 Foreign body sensation, throat: Secondary | ICD-10-CM | POA: Diagnosis not present

## 2023-12-17 DIAGNOSIS — K219 Gastro-esophageal reflux disease without esophagitis: Secondary | ICD-10-CM

## 2023-12-17 MED ORDER — FLUTICASONE PROPIONATE 50 MCG/ACT NA SUSP: 2.0000 | Freq: Every day | NASAL | 6 refills | Status: AC

## 2023-12-17 MED ORDER — FAMOTIDINE 20 MG PO TABS: 20.0000 mg | ORAL_TABLET | Freq: Two times a day (BID) | ORAL | 3 refills | Status: DC

## 2023-12-17 MED ORDER — CETIRIZINE HCL 10 MG PO TABS: 10.0000 mg | ORAL_TABLET | Freq: Every day | ORAL | 11 refills | Status: DC

## 2023-12-17 NOTE — Progress Notes (Signed)
ENT CONSULT:  Reason for Consult: throat discomfort for months    HPI: Discussed the use of AI scribe software for clinical note transcription with the patient, who gave verbal consent to proceed.  History of Present Illness   The patient presents with a globus sensation and throat discomfort for several months.  She describes the sensation as a feeling of something in her throat, similar to trying to cough up phlegm. This has persisted for several months, originating after a COVID-19 infection in 2021. Occasionally, phlegm is expelled, but often she must exert effort to clear it. No pain is noted when swallowing, and she currently experiences no shortness of breath, although she did during her visit to ED in Dec 2024, when she was diagnosed with PE. On Eliquis for it.   She has a documented history of heartburn or reflux but is not currently on any medication for it. There are no reported thyroid problems, although her sister has a history of thyroid issues.  She has a history of allergies and was previously using Flonase nasal spray, which she has discontinued. She is not on any current allergy medication.  She denies any history of smoking, heavy alcohol use, or drug use.     Records Reviewed:  ED visit 10/27/23  Patient with known PE presents to the emergency department with right-sided chest pain, shortness of breath.  Patient reports pain worsened tonight.  She took her oxycodone but it did not help.  She started having increasing shortness of breath.  Room air oxygen saturations were documented at 88%.  She does not normally use oxygen.  She was brought to the ER on supplemental oxygen.   EKG unchanged.  Troponin normal, doubt heart strain.  Will convert to heparin and admit to hospital for pain control.     Addendum: Patient seen and evaluated by family practice team.  Patient now off of oxygen doing well.  She does not want to be admitted.  I suspect that her hypoxia earlier was  secondary to splinting secondary to pain.  She has follow-up scheduled in the office tomorrow.    Past Medical History:  Diagnosis Date   Arthritis    Carpal tunnel syndrome on both sides    Full dentures    Hypertension    Type 2 diabetes mellitus (HCC)    followed by pcp    (01-12-2023 per pt pcp note in epic DM2 ,however , per pt does have diabetes anymore just takes metformen to lose weight)   Uterine mass     Past Surgical History:  Procedure Laterality Date   COLONOSCOPY WITH PROPOFOL  04/22/2021   dr Jolinda Croak   DILATATION & CURETTAGE/HYSTEROSCOPY WITH MYOSURE N/A 01/14/2023   Procedure: DILATATION & CURETTAGE/HYSTEROSCOPY WITH MYOSURE;  Surgeon: Carver Fila, MD;  Location: [facility];  Service: Gynecology;  Laterality: N/A;   MULTIPLE TOOTH EXTRACTIONS     in [place]   ROBOTIC ASSISTED TOTAL HYSTERECTOMY WITH BILATERAL SALPINGO OOPHERECTOMY Bilateral 06/17/2023   Procedure: XI ROBOTIC ASSISTED TOTAL HYSTERECTOMY WITH BILATERAL SALPINGO OOPHORECTOMY;  Surgeon: Carver Fila, MD;  Location: [facility];  Service: Gynecology;  Laterality: Bilateral;    Family History  Problem Relation Age of Onset   Heart disease Mother 49       MI   Cancer Sister 66       lung cancer   Stroke Maternal Grandmother    Heart disease Maternal Grandfather    Stroke Daughter  Diabetes Neg Hx    Colon cancer Neg Hx    Breast cancer Neg Hx    Ovarian cancer Neg Hx    Endometrial cancer Neg Hx    Pancreatic cancer Neg Hx    Prostate cancer Neg Hx     Social History:  reports that she has never smoked. She has never used smokeless tobacco. She reports that she does not drink alcohol and does not use drugs.  Allergies: No Known Allergies  Medications: I have reviewed the patient's current medications.  The PMH, PSH, Medications, Allergies, and SH were reviewed and updated.  ROS: Constitutional: Negative for fever, weight loss and weight gain. Cardiovascular:  Negative for chest pain and dyspnea on exertion. Respiratory: Is not experiencing shortness of breath at rest. Gastrointestinal: Negative for nausea and vomiting. Neurological: Negative for headaches. Psychiatric: The patient is not nervous/anxious  Blood pressure (!) 174/99, pulse 74, height 5\' 7"  (1.702 m), weight 243 lb (110.2 kg), SpO2 99%.  PHYSICAL EXAM:  Exam: General: Well-developed, well-nourished Communication and Voice: Clear pitch and clarity Respiratory Respiratory effort: Equal inspiration and expiration without stridor Cardiovascular Peripheral Vascular: Warm extremities with equal color/perfusion Eyes: No nystagmus with equal extraocular motion bilaterally Neuro/Psych/Balance: Patient oriented to person, place, and time; Appropriate mood and affect; Gait is intact with no imbalance; Cranial nerves I-XII are intact Head and Face Inspection: Normocephalic and atraumatic without mass or lesion Palpation: Facial skeleton intact without bony stepoffs Salivary Glands: No mass or tenderness Facial Strength: Facial motility symmetric and full bilaterally ENT Pinna: External ear intact and fully developed External canal: Canal is patent with intact skin Tympanic Membrane: Clear and mobile External Nose: No scar or anatomic deformity Internal Nose: Septum is relatively straight. No polyp, or purulence. Mucosal edema and erythema present.  Bilateral inferior turbinate hypertrophy.  Lips, Teeth, and gums: Mucosa and teeth intact and viable TMJ: No pain to palpation with full mobility Oral cavity/oropharynx: No erythema or exudate, no lesions present 2+ cryptic tonsils prominent lingual tonsils  Nasopharynx: No mass or lesion with intact mucosa Hypopharynx: Intact mucosa without pooling of secretions Larynx Glottic: Full true vocal cord mobility without lesion or mass Supraglottic: Normal appearing epiglottis and AE folds Interarytenoid Space: Moderate  pachydermia&edema Subglottic Space: Patent without lesion or edema Neck Neck and Trachea: Midline trachea without mass or lesion Thyroid: No mass or nodularity Lymphatics: No lymphadenopathy  Procedure: Preoperative diagnosis: globus sensation phlegm in her throat  Postoperative diagnosis:   Same + GERD LPR and post-nasal drainage   Procedure: Flexible fiberoptic laryngoscopy  Surgeon: Ashok Croon, MD  Anesthesia: Topical lidocaine and Afrin Complications: None Condition is stable throughout exam  Indications and consent:  The patient presents to the clinic with Indirect laryngoscopy view was incomplete. Thus it was recommended that they undergo a flexible fiberoptic laryngoscopy. All of the risks, benefits, and potential complications were reviewed with the patient preoperatively and verbal informed consent was obtained.  Procedure: The patient was seated upright in the clinic. Topical lidocaine and Afrin were applied to the nasal cavity. After adequate anesthesia had occurred, I then proceeded to pass the flexible telescope into the nasal cavity. The nasal cavity was patent without rhinorrhea or polyp. The nasopharynx was also patent without mass or lesion. The base of tongue was visualized and was normal. There were no signs of pooling of secretions in the piriform sinuses. The true vocal folds were mobile bilaterally. There were no signs of glottic or supraglottic mucosal lesion or mass. There was  moderate interarytenoid pachydermia and post cricoid edema. The telescope was then slowly withdrawn and the patient tolerated the procedure throughout.   Studies Reviewed: 10/25/23 CT Angio PE IMPRESSION: Small right lower lobe pulmonary embolus without significant right heart strain.   No other focal abnormality is noted  CXR 10/27/23 IMPRESSION: Mild cardiomegaly with mild bibasilar atelectasis.  Assessment/Plan: Encounter Diagnoses  Name Primary?   Gastroesophageal reflux  disease without esophagitis    Globus sensation Yes   Tonsillar hypertrophy    Chronic GERD    Environmental and seasonal allergies    Post-nasal drip     Assessment and Plan    Chronic throat discomfort and Globus Sensation since COVID-19 2021 Chronic sensation of a lump in the throat, likely exacerbated by postnasal drip and untreated GERD. No dysphagia or dyspnea. Scope exam showed prominent lingual tonsils and post-cricoid edema and pachydermia, no evidence of lesions or tumors. No smoking, heavy alcohol use, or drug use hx. Family history of thyroid issues, but no personal thyroid problems reported. - Prescribe Zyrtec 10 mg nightly - Prescribe Flonase daily 2 puffs b/l - Prescribe Pepcid for reflux 20 mg BID - Recommend dietary and lifestyle changes to manage reflux (e.g., avoid late meals, do not lie down after eating) - Recommend Reflux Gourmet supplement after meals - Advise gargling with salt water in the morning 2/2 sx could be related to chronic tonsillitis (has 2+ cryptic tonsils on exam) - Recommend Debrox for earwax removal   Postnasal Drip and suspected environmental allergies  Contributing to globus sensation. No current allergy medication use. - Prescribe Zyrtec 10 mg nightly - Prescribe Flonase daily 2 puffs b/l - will consider allergy testing in the future if sx persist   Chronic GERD LPR Gastroesophageal Reflux Disease (GERD) Untreated reflux causing laryngeal changes and likely contributing to globus sensation. No current reflux medication use. - Prescribe Pepcid 20 mg BID - Recommend dietary and lifestyle changes to manage reflux (e.g., avoid late meals, do not lie down after eating) - Recommend Reflux Gourmet supplement after meals   Tonsillar hypertrophy throat sx could be 2/2 chronic tonsillitis denies hx of recurrent tonsillitis or need for abx  - gargle with salt water every am  Thank you for allowing me to participate in the care of this patient. Please  do not hesitate to contact me with any questions or concerns.   Ashok Croon, MD Otolaryngology [facility] Phone: [phone] Fax: [phone]    12/17/2023, 8:32 AM

## 2023-12-17 NOTE — Patient Instructions (Signed)

## 2023-12-22 ENCOUNTER — Ambulatory Visit: Payer: Medicare HMO | Admitting: Student

## 2023-12-23 ENCOUNTER — Ambulatory Visit (INDEPENDENT_AMBULATORY_CARE_PROVIDER_SITE_OTHER): Payer: Medicare HMO | Admitting: Student

## 2023-12-23 ENCOUNTER — Encounter: Payer: Self-pay | Admitting: Student

## 2023-12-23 VITALS — BP 147/85 | HR 79 | Ht 66.0 in | Wt 256.4 lb

## 2023-12-23 DIAGNOSIS — I10 Essential (primary) hypertension: Secondary | ICD-10-CM | POA: Diagnosis not present

## 2023-12-23 DIAGNOSIS — E119 Type 2 diabetes mellitus without complications: Secondary | ICD-10-CM

## 2023-12-23 LAB — POCT GLYCOSYLATED HEMOGLOBIN (HGB A1C): HbA1c, POC (controlled diabetic range): 6.7 % (ref 0.0–7.0)

## 2023-12-23 MED ORDER — OLMESARTAN MEDOXOMIL 20 MG PO TABS: 20.0000 mg | ORAL_TABLET | Freq: Every day | ORAL | 0 refills | Status: DC

## 2023-12-23 MED ORDER — METFORMIN HCL ER 500 MG PO TB24: 500.0000 mg | ORAL_TABLET | Freq: Every day | ORAL | Status: DC

## 2023-12-23 NOTE — Patient Instructions (Signed)
 It was great to see you! Thank you for allowing me to participate in your care!  I recommend that you always bring your medications to each appointment as this makes it easy to ensure you are on the correct medications and helps us  not miss when refills are needed.  Our plans for today:  - Olmasartan (blood pressure medication) sent to pharmacy. Take in addition to the hydrochlorothiazide  - Schedule a follow up apt in 2 weeks -diabetes is well controlled, no changes to metformin    Take care and seek immediate care sooner if you develop any concerns.   Dr. Lauraine Molt, DO [facility]

## 2023-12-23 NOTE — Assessment & Plan Note (Signed)
 Well-controlled with A1c 6.7 today -Continue metformin  500 mg once daily

## 2023-12-23 NOTE — Assessment & Plan Note (Signed)
 Uncontrolled -Add olmesartan  20 mg daily in addition to HCTZ 25 mg daily -Return in 2 weeks for BP check and BMP

## 2023-12-23 NOTE — Progress Notes (Signed)
    SUBJECTIVE:   CHIEF COMPLAINT / HPI:   T2DM Last A1c 09/02/23 of 7 Currently taking metformin  500 mg once daily (although prescribed to be BID).   HTN Currently taking HCTZ 25 mg daily.  Checks BP at home and has been ranging 130s to 140s systolic.  PERTINENT  PMH / PSH: T2DM, HTN  OBJECTIVE:   BP (!) 147/85   Pulse 79   Ht 5' 6 (1.676 m)   Wt 256 lb 6.4 oz (116.3 kg)   SpO2 94%   BMI 41.38 kg/m    General: NAD, pleasant, able to participate in exam Cardiac: RRR, no murmurs. Respiratory: Normal effort Skin: warm and dry, no rashes noted Neuro: alert, no obvious focal deficits Psych: Normal affect and mood  ASSESSMENT/PLAN:   Type 2 diabetes mellitus without complication, without long-term current use of insulin  (HCC) Well-controlled with A1c 6.7 today -Continue metformin  500 mg once daily  Essential hypertension Uncontrolled -Add olmesartan  20 mg daily in addition to HCTZ 25 mg daily -Return in 2 weeks for BP check and BMP     Dr. Lauraine Molt, DO [facility]

## 2024-01-04 ENCOUNTER — Ambulatory Visit
Admission: RE | Admit: 2024-01-04 | Discharge: 2024-01-04 | Disposition: A | Discharge status: Home / Self Care | Payer: Medicare HMO | Source: Ambulatory Visit | Attending: Family Medicine | Admitting: Family Medicine

## 2024-01-04 DIAGNOSIS — M8588 Other specified disorders of bone density and structure, other site: Secondary | ICD-10-CM | POA: Diagnosis not present

## 2024-01-04 DIAGNOSIS — Z13820 Encounter for screening for osteoporosis: Secondary | ICD-10-CM

## 2024-01-04 DIAGNOSIS — N958 Other specified menopausal and perimenopausal disorders: Secondary | ICD-10-CM | POA: Diagnosis not present

## 2024-01-04 DIAGNOSIS — E2839 Other primary ovarian failure: Secondary | ICD-10-CM | POA: Diagnosis not present

## 2024-01-04 DIAGNOSIS — Z90722 Acquired absence of ovaries, bilateral: Secondary | ICD-10-CM | POA: Diagnosis not present

## 2024-01-06 ENCOUNTER — Telehealth: Payer: Self-pay | Admitting: Family Medicine

## 2024-01-06 NOTE — Telephone Encounter (Signed)
**   AFTER HOURS PAGE **  Called regarding appointment scheduled for tomorrow but was unable to get through to the office.  Does not think she will be able to make her appointment and would like to reschedule it due to the weather.  Rescheduled patient for 2/26 at 8:50 AM with Dr. Laroy Apple as unfortunately Dr. Yetta Barre is not available for morning appointment.  Elberta Fortis, DO

## 2024-01-07 ENCOUNTER — Telehealth: Payer: Self-pay | Admitting: Student

## 2024-01-07 ENCOUNTER — Ambulatory Visit: Payer: Medicare HMO | Admitting: Student

## 2024-01-07 DIAGNOSIS — M85859 Other specified disorders of bone density and structure, unspecified thigh: Secondary | ICD-10-CM

## 2024-01-07 MED ORDER — VITAMIN D3 20 MCG (800 UNIT) PO TABS: 1.0000 | ORAL_TABLET | Freq: Every day | ORAL | 3 refills | Status: AC

## 2024-01-07 MED ORDER — CALCIUM 250 MG PO CAPS: 1.0000 | ORAL_CAPSULE | Freq: Two times a day (BID) | ORAL | 3 refills | Status: AC

## 2024-01-07 NOTE — Telephone Encounter (Signed)
Called pt to discuss DEXA results showing osteopenia. She does not drink or smoke and does not drink milk. Recommended regular exercise and a vit d and calcium supplement. She used to take weekly vit D 50,000 supplement but hasn't in several months. Discussed that I do not think checking vit D level is necessary as it was last checked in 2020 and was >20.  Vit D/calcium supplement sent to pharmacy. If pt is really wanting vit. D checked, can consider checking at future visit.

## 2024-01-13 ENCOUNTER — Ambulatory Visit (INDEPENDENT_AMBULATORY_CARE_PROVIDER_SITE_OTHER): Payer: Medicare HMO | Admitting: Student

## 2024-01-13 ENCOUNTER — Encounter: Payer: Self-pay | Admitting: Student

## 2024-01-13 VITALS — BP 160/88 | HR 73 | Ht 66.0 in | Wt 253.4 lb

## 2024-01-13 DIAGNOSIS — I10 Essential (primary) hypertension: Secondary | ICD-10-CM

## 2024-01-13 NOTE — Progress Notes (Signed)
    SUBJECTIVE:   CHIEF COMPLAINT / HPI: Blood pressure follow-up  Hypertension: Patient is a 68 y.o. female who present today for follow up of hypertension.   Patient endorses no problems  Home medications include: Recently added HCTZ 25 mg to olmesartan 20 mg Patient endorses taking these medications as prescribed.  Most recent creatinine trend:  Lab Results  Component Value Date   CREATININE 0.75 12/01/2023   CREATININE 0.86 10/27/2023   CREATININE 0.68 10/25/2023   BP at home have been 130s/70s, highest SBP in 140s-150s  PERTINENT  PMH / PSH: Hypertension, history of PE, T2DM  OBJECTIVE:   BP (!) 140/80   Pulse 73   Ht 5\' 6"  (1.676 m)   Wt 253 lb 6 oz (114.9 kg)   SpO2 97%   BMI 40.90 kg/m   General: Well appearing, NAD, awake, alert, responsive to questions Head: Normocephalic atraumatic CV: Regular rate and rhythm no murmurs rubs or gallops Respiratory: Clear to ausculation bilaterally, no wheezes rales or crackles, chest rises symmetrically,  no increased work of breathing  ASSESSMENT/PLAN:   Assessment & Plan Essential hypertension Patient's blood pressure is not controlled today. BP: (!) 140/80. Did not take BP meds today. Goal of 140/90. Patient's medication regimen includes olmesartan 20 mg, HCTZ 25 mg. -Changes to current regimen include none -Referral to pharmacy clinic due to Discrepancy between home and clinic BP -Labs: BMP -Follow up in 1 week with Dr. Raymondo Band for ambulatory blood pressure monitoring   Levin Erp, MD [facility]

## 2024-01-13 NOTE — Assessment & Plan Note (Signed)
 Patient's blood pressure is not controlled today. BP: (!) 140/80. Did not take BP meds today. Goal of 140/90. Patient's medication regimen includes olmesartan 20 mg, HCTZ 25 mg. -Changes to current regimen include none -Referral to pharmacy clinic due to Discrepancy between home and clinic BP -Labs: BMP -Follow up in 1 week with Dr. Raymondo Band for ambulatory blood pressure monitoring

## 2024-01-13 NOTE — Patient Instructions (Signed)
 It was great to see you! Thank you for allowing me to participate in your care!   Our plans for today:  -Continue your HCTZ and olmesartan as prescribed, I have scheduled you for ambulatory blood pressure monitoring on 3/3 at 8:30 AM  Take care and seek immediate care sooner if you develop any concerns.  Levin Erp, MD

## 2024-01-14 ENCOUNTER — Encounter: Payer: Self-pay | Admitting: Student

## 2024-01-14 LAB — BASIC METABOLIC PANEL
BUN/Creatinine Ratio: 10 — ABNORMAL LOW (ref 12–28)
BUN: 7 mg/dL — ABNORMAL LOW (ref 8–27)
CO2: 24 mmol/L (ref 20–29)
Calcium: 9.1 mg/dL (ref 8.7–10.3)
Chloride: 105 mmol/L (ref 96–106)
Creatinine, Ser: 0.7 mg/dL (ref 0.57–1.00)
Glucose: 85 mg/dL (ref 70–99)
Potassium: 4.3 mmol/L (ref 3.5–5.2)
Sodium: 146 mmol/L — ABNORMAL HIGH (ref 134–144)
eGFR: 96 mL/min/{1.73_m2} (ref >59)

## 2024-01-18 ENCOUNTER — Encounter: Payer: Self-pay | Admitting: Pharmacist

## 2024-01-18 ENCOUNTER — Ambulatory Visit (INDEPENDENT_AMBULATORY_CARE_PROVIDER_SITE_OTHER): Payer: Medicare HMO | Admitting: Pharmacist

## 2024-01-18 VITALS — BP 146/88 | HR 71 | Wt 251.8 lb

## 2024-01-18 DIAGNOSIS — I10 Essential (primary) hypertension: Secondary | ICD-10-CM

## 2024-01-18 MED ORDER — AMLODIPINE BESYLATE 5 MG PO TABS: 5.0000 mg | ORAL_TABLET | Freq: Every day | ORAL | 3 refills | Status: DC

## 2024-01-18 NOTE — Assessment & Plan Note (Signed)
 History of hypertension longstanding currently taking; hydrochlorothiazide only with goal presssure of <130/80.     - As patient was still on monotherapy, we agreed to add amlodipine and schedule amb BP in 1 week.  -START amlodipine 5mg  once daily -Continue hydrochlorothiazide 25mg  daily

## 2024-01-18 NOTE — Addendum Note (Signed)
 Addended by: Kathrin Ruddy on: 01/18/2024 01:57 PM   Modules accepted: Level of Service

## 2024-01-18 NOTE — Patient Instructions (Signed)
 It was nice to see you today!  We have rescheduled your Blood Pressure Assessment for next Monday   Your goal blood pressure is < 130/80 mmHg   Medication Changes: START amlodipine 5mg  once daily Continue hydrochlorothiazide daily  Discontinue olmesartan  Continue all other medication the same.   Monitor blood pressure at home and keep a log (on a piece of paper) to bring with you to your next visit.

## 2024-01-18 NOTE — Progress Notes (Signed)
 Reviewed and agree with Dr Macky Lower plan.

## 2024-01-18 NOTE — Progress Notes (Signed)
 S:     Chief Complaint  Patient presents with   Medication Management    Amb BP [place] - Rescheduled for 3/10   68 y.o. female who presents for hypertension evaluation, education, and management. Patient arrives in  good spirits and presents without  any assistance.   PMH is significant for diabetes and hypertension.  Patient was referred and last seen by Primary Care Provider, Dr. Laroy Apple, on 01/13/2024.   At last visit, patient expressed concern over blood pressure.   Today, expressed concern of "irregular heartbeat" on home BP monitor.   Diagnosed with Hypertension in the year of ~ 1999.    Medication compliance is reported to be good however did NOT take olmesartan due to fear of seizures which she read from product information.  Following discussion of common side including the rare angioedema patient was unwilling to consider ARB therapy.  We discussed amlodipine, patient educated on purpose, proper use and potential adverse effects of peripheral edema.    Discussed procedure for wearing the monitor and gave patient written instructions.   Current BP Medications include:  olmesartan (did not take at all due to side effect concern) and hydrochlorothiazide (reports adherence).  Antihypertensives tried in the past include: None   O:  Review of Systems  All other systems reviewed and are negative.   Physical Exam Vitals reviewed.  Constitutional:      Appearance: Normal appearance.  Pulmonary:     Effort: Pulmonary effort is normal.  Musculoskeletal:     Right lower leg: No edema.     Left lower leg: No edema.  Neurological:     Mental Status: She is alert.  Psychiatric:        Mood and Affect: Mood normal.        Behavior: Behavior normal.     Last 3 Office BP readings: BP Readings from Last 3 Encounters:  01/18/24 (!) 146/88  01/13/24 (!) 160/88  12/23/23 (!) 147/85    Clinical Atherosclerotic Cardiovascular Disease (ASCVD): No  The 10-year ASCVD risk  score (Arnett DK, et al., 2019) is: 19.7%   Values used to calculate the score:     Age: 56 years     Sex: Female     Is Non-Hispanic African American: Yes     Diabetic: Yes     Tobacco smoker: No     Systolic Blood Pressure: 146 mmHg     Is BP treated: Yes     HDL Cholesterol: 51 mg/dL     Total Cholesterol: 133 mg/dL  Basic Metabolic Panel    Component Value Date/Time   NA 146 (H) 01/13/2024 0906   K 4.3 01/13/2024 0906   CL 105 01/13/2024 0906   CO2 24 01/13/2024 0906   GLUCOSE 85 01/13/2024 0906   GLUCOSE 113 (H) 12/01/2023 0932   BUN 7 (L) 01/13/2024 0906   CREATININE 0.70 01/13/2024 0906   CREATININE 0.74 09/11/2016 1540   CALCIUM 9.1 01/13/2024 0906   GFRNONAA >60 12/01/2023 0932   GFRAA 111 10/05/2020 1026    Renal function: Estimated Creatinine Clearance: 90 mL/min (by C-G formula based on SCr of 0.7 mg/dL).   ABPM Study Deferred until patient has taken new 2 drug therapy.   A/P: History of hypertension longstanding currently taking; hydrochlorothiazide only with goal presssure of <130/80.     - As patient was still on monotherapy, we agreed to add amlodipine and schedule amb BP in 1 week.  -START amlodipine 5mg  once daily -Continue hydrochlorothiazide  25mg  daily  Patient verbalized understanding of plan. Total time in face to face counseling 17 minutes.    Follow-up: 1 week for Amb BP monitor placement.  Patient seen with Mack Guise, PharmD Candidate.

## 2024-01-23 ENCOUNTER — Other Ambulatory Visit: Payer: Self-pay | Admitting: Student

## 2024-01-23 DIAGNOSIS — I10 Essential (primary) hypertension: Secondary | ICD-10-CM

## 2024-01-25 ENCOUNTER — Ambulatory Visit (INDEPENDENT_AMBULATORY_CARE_PROVIDER_SITE_OTHER): Admitting: Pharmacist

## 2024-01-25 ENCOUNTER — Encounter: Payer: Self-pay | Admitting: Pharmacist

## 2024-01-25 VITALS — BP 166/100 | Wt 253.2 lb

## 2024-01-25 DIAGNOSIS — I10 Essential (primary) hypertension: Secondary | ICD-10-CM

## 2024-01-25 NOTE — Assessment & Plan Note (Signed)
 History of hypertension since 1999 (28 years) currently taking; amlodipine 5 mg daily and hydrochlorothiazide 25 mg daily with goal pressure of <130/80. BP higher in clinic today (166/100) but patient reports 3 home BP readings of 127-132/74-81 mm Hg appears controlled at home.  -Placed blood pressure cuff, provided education, patient instructed to wear cuff for 24 hours and return tomorrow to review results.

## 2024-01-25 NOTE — Patient Instructions (Signed)

## 2024-01-25 NOTE — Progress Notes (Signed)
 Reviewed and agree with Dr Macky Lower plan.

## 2024-01-25 NOTE — Progress Notes (Signed)
 S:     Chief Complaint  Patient presents with   Medication Management    Amb BP Monitor Day#1   68 y.o. female who presents for hypertension evaluation, education, and management. Patient arrives in good spirits and presents without any assistance.   PMH is significant for HTN, HLD.  Patient was referred and last seen by me on 01/18/2024.  At last visit, olmesartan 20 mg was discontinued per patient request (patient reported concern of seizures) and amlodipine 5 mg was initiated.   Diagnosed with Hypertension in the year of 1999.    Medication compliance is reported to be compliant.  Discussed procedure for wearing the monitor and gave patient written instructions. Monitor was placed on non-dominant arm with instructions to return in the morning.   Current BP Medications include:  amlodipine 5 mg daily, hydrochlorothiazide 25 mg daily  Antihypertensives tried in the past include: olmesartan 20 mg daily (never started due to patient preference)   O:  Review of Systems  All other systems reviewed and are negative.   Physical Exam Neurological:     Mental Status: She is alert.  Psychiatric:        Mood and Affect: Mood normal.        Behavior: Behavior normal.        Thought Content: Thought content normal.        Judgment: Judgment normal.     Last 3 Office BP readings: BP Readings from Last 3 Encounters:  01/18/24 (!) 146/88  01/13/24 (!) 160/88  12/23/23 (!) 147/85    Clinical Atherosclerotic Cardiovascular Disease (ASCVD): Yes  The 10-year ASCVD risk score (Arnett DK, et al., 2019) is: 19.7%   Values used to calculate the score:     Age: 71 years     Sex: Female     Is Non-Hispanic African American: Yes     Diabetic: Yes     Tobacco smoker: No     Systolic Blood Pressure: 146 mmHg     Is BP treated: Yes     HDL Cholesterol: 51 mg/dL     Total Cholesterol: 133 mg/dL  Basic Metabolic Panel    Component Value Date/Time   NA 146 (H) 01/13/2024 0906   K  4.3 01/13/2024 0906   CL 105 01/13/2024 0906   CO2 24 01/13/2024 0906   GLUCOSE 85 01/13/2024 0906   GLUCOSE 113 (H) 12/01/2023 0932   BUN 7 (L) 01/13/2024 0906   CREATININE 0.70 01/13/2024 0906   CREATININE 0.74 09/11/2016 1540   CALCIUM 9.1 01/13/2024 0906   GFRNONAA >60 12/01/2023 0932   GFRAA 111 10/05/2020 1026    Renal function: Estimated Creatinine Clearance: 90 mL/min (by C-G formula based on SCr of 0.7 mg/dL).   ABPM Study Data: Arm Placement: left arm   For Office Goal BP of <130/80 mmHg:  ABPM thresholds: Overall BP <125/75 mmHg, daytime BP <130/80 mmHg, sleeptime BP <110/65 mmHg     A/P: History of hypertension since 1999 (28 years) currently taking; amlodipine 5 mg daily and hydrochlorothiazide 25 mg daily with goal pressure of <130/80. BP higher in clinic today (166/100) but patient reports 3 home BP readings of 127-132/74-81 mm Hg appears controlled at home.  -Placed blood pressure cuff, provided education, patient instructed to wear cuff for 24 hours and return tomorrow to review results.   Written patient instructions provided including activity/symptom/event log. Patient verbalized understanding of plan. Total time in face to face counseling 17 minutes.    Follow-up:  Tomorrow AM - early morning appointment 8:30  Patient seen with Threasa Heads, PharmD Candidate and Mack Guise, PharmD Candidate.

## 2024-01-26 ENCOUNTER — Ambulatory Visit (INDEPENDENT_AMBULATORY_CARE_PROVIDER_SITE_OTHER): Admitting: Pharmacist

## 2024-01-26 ENCOUNTER — Encounter: Payer: Self-pay | Admitting: Pharmacist

## 2024-01-26 VITALS — BP 136/75 | Wt 253.2 lb

## 2024-01-26 DIAGNOSIS — I10 Essential (primary) hypertension: Secondary | ICD-10-CM | POA: Diagnosis not present

## 2024-01-26 MED ORDER — AMLODIPINE BESYLATE 10 MG PO TABS: 10.0000 mg | ORAL_TABLET | Freq: Every day | ORAL | 3 refills | Status: AC

## 2024-01-26 NOTE — Progress Notes (Signed)
   S:     Chief Complaint  Patient presents with   Medication Management    BP Monitor Visit 2   68 y.o. female who presents for hypertension evaluation, education, and management.  Patient arrives in good spirits and presents without any assistance.   PMH is significant for HTN, HLD, T2DM.  Patient returns to clinic with 24 hour blood pressure monitor and reports some difficulty sleeping yesterday. Patient reports they were able to wear the Ambulatory Blood Pressure Cuff for the entire 24 evaluation period.   Dietary salt intake discussed.   Routinely consumes pork products and very kindly offered to bring me some.  We also discussed limiting intake of potato chips and pork rinds.   O:  Review of Systems  All other systems reviewed and are negative.   Last 3 Office BP readings: BP Readings from Last 3 Encounters:  01/25/24 (!) 166/100  01/18/24 (!) 146/88  01/13/24 (!) 160/88    Clinical Atherosclerotic Cardiovascular Disease (ASCVD): No  The 10-year ASCVD risk score (Arnett DK, et al., 2019) is: 26%   Values used to calculate the score:     Age: 56 years     Sex: Female     Is Non-Hispanic African American: Yes     Diabetic: Yes     Tobacco smoker: No     Systolic Blood Pressure: 166 mmHg     Is BP treated: Yes     HDL Cholesterol: 51 mg/dL     Total Cholesterol: 133 mg/dL  Basic Metabolic Panel    Component Value Date/Time   NA 146 (H) 01/13/2024 0906   K 4.3 01/13/2024 0906   CL 105 01/13/2024 0906   CO2 24 01/13/2024 0906   GLUCOSE 85 01/13/2024 0906   GLUCOSE 113 (H) 12/01/2023 0932   BUN 7 (L) 01/13/2024 0906   CREATININE 0.70 01/13/2024 0906   CREATININE 0.74 09/11/2016 1540   CALCIUM 9.1 01/13/2024 0906   GFRNONAA >60 12/01/2023 0932   GFRAA 111 10/05/2020 1026    ABPM Study Data: Arm Placement left arm  Overall Mean 24hr BP:   136/75 mmHg  HR: 76  Daytime Mean BP:  136/77 mmHg  HR: 80  Nighttime Mean BP:  134/72 mmHg  HR: 69  Dipping Pattern:  No.  Sys:   1.5%   Dia: 6.5%   [normal dipping ~10-20%]   For Office Goal BP of <130/80 mmHg:  ABPM thresholds: Overall BP <125/75 mmHg, daytime BP <130/80 mmHg, sleeptime BP <110/65 mmHg    Patient is participating in a Managed Medicaid Plan:  No   A/P: History of hypertension since 1999 currently taking hydrochlorothiazide 25 mg daily and amlodipine 5 mg daily; with goal presssure of <130/80.  Found to have elevated blood pressure with 24-hour ambulatory evaluation which demonstrates an average AWAKE blood pressure of 136/77 mmHg.  Nocturnal dipping pattern is abnormal < 10%.   Changes to medications -Increased dose of amlodipine to 10 mg daily -Counseled patient to avoid adding extra salt to her meals.  Results reviewed and written information provided.    Written patient instructions provided. Patient verbalized understanding of treatment plan.  Total time in face to face counseling 21 minutes.    Follow-up:  Pharmacist 02/16/2024 Patient seen with Threasa Heads, PharmD Candidate and Mack Guise, PharmD Candidate.

## 2024-01-26 NOTE — Assessment & Plan Note (Signed)
 History of hypertension since 1999 currently taking hydrochlorothiazide 25 mg daily and amlodipine 5 mg daily; with goal presssure of <130/80.  Found to have elevated blood pressure with 24-hour ambulatory evaluation which demonstrates an average AWAKE blood pressure of 136/77 mmHg.  Nocturnal dipping pattern is abnormal < 10%.   Changes to medications -Increased dose of amlodipine to 10 mg daily -Counseled patient to avoid adding extra salt to her meals.

## 2024-01-26 NOTE — Patient Instructions (Addendum)
 It was nice to see you today!  Your goal blood pressure is <130/80  Medication Changes:  Increase amlodipine from 5 mg daily to 10 mg daily  Continue all other medication the same.    Monitor blood pressure at home daily and keep a log (on your phone or piece of paper) to bring with you to your next visit. Write down date, time, blood pressure and pulse.  Keep up the good work with diet and exercise. Aim for a diet full of vegetables, fruit and lean meats (chicken, [place], fish). Try to limit salt intake by eating fresh or frozen vegetables (instead of canned), rinse canned vegetables prior to cooking and do not add any additional salt to meals.

## 2024-01-27 ENCOUNTER — Other Ambulatory Visit: Payer: Self-pay | Admitting: Student

## 2024-01-27 DIAGNOSIS — I10 Essential (primary) hypertension: Secondary | ICD-10-CM

## 2024-01-29 NOTE — Progress Notes (Signed)
 Reviewed and agree with Dr Macky Lower plan.

## 2024-02-13 ENCOUNTER — Other Ambulatory Visit: Payer: Self-pay | Admitting: Family Medicine

## 2024-02-15 ENCOUNTER — Other Ambulatory Visit: Payer: Self-pay | Admitting: Student

## 2024-02-15 DIAGNOSIS — I10 Essential (primary) hypertension: Secondary | ICD-10-CM

## 2024-02-16 ENCOUNTER — Encounter: Payer: Self-pay | Admitting: Pharmacist

## 2024-02-16 ENCOUNTER — Ambulatory Visit (INDEPENDENT_AMBULATORY_CARE_PROVIDER_SITE_OTHER): Admitting: Pharmacist

## 2024-02-16 VITALS — BP 140/81 | HR 69 | Wt 252.6 lb

## 2024-02-16 DIAGNOSIS — E785 Hyperlipidemia, unspecified: Secondary | ICD-10-CM | POA: Diagnosis not present

## 2024-02-16 DIAGNOSIS — I10 Essential (primary) hypertension: Secondary | ICD-10-CM | POA: Diagnosis not present

## 2024-02-16 MED ORDER — ATORVASTATIN CALCIUM 40 MG PO TABS: 40.0000 mg | ORAL_TABLET | Freq: Every day | ORAL | 3 refills | Status: AC

## 2024-02-16 NOTE — Patient Instructions (Addendum)
 It was nice to see you today!  Your goal blood pressure is <130/80 mmHg.  Medication Changes: START atorvastatin 40 mg.  Consider starting valsartan, olmesartan, or eplerenone for next visit if blood pressure remains elevated.  Continue all other medication the same.   Monitor blood pressure at home 2 to 3 times daily and keep a log (on your phone or piece of paper) to bring with you to your next visit. Write down date, time, blood pressure and pulse.  Keep up the good work with diet and exercise. Aim for a diet full of vegetables, fruit and lean meats (chicken, [place], fish). Try to limit salt intake by eating fresh or frozen vegetables (instead of canned), rinse canned vegetables prior to cooking and do not add any additional salt to meals.

## 2024-02-16 NOTE — Assessment & Plan Note (Signed)
 Hypertension diagnosed ~26 years (1999) currently uncontrolled on current medications amlodipine 10 mg and hydrochlorothiazide 25 mg, although blood pressure readings at home appear near goal (~127-132/74). BP goal < 130/80 mmHg. Medication adherence appears good. Control is suboptimal due to dietary habits and lifestyle. Patient endorses motivation to exercise 1 hour per day 3 days per week. Patient's goal weight by next appointment is <245 lb. -Continued amlodipine 10 mg.  -Continued hydrochlorothiazide 25 mg. -Patient encouraged to research side effect profile of valsartan, olmesartan, and eplerenone for possible initiation upon next visit. -Patient educated on purpose, proper use, and potential adverse effects of.  -Counseled on lifestyle modifications for blood pressure control including reduced dietary sodium, increased exercise, adequate sleep. -Encouraged patient to check BP at home and bring log of readings to next visit. Counseled on proper use of home BP cuff.

## 2024-02-16 NOTE — Progress Notes (Signed)
 S:     Chief Complaint  Patient presents with   Medication Management    Hypertension follow-up   68 y.o. female who presents for hypertension evaluation, education, and management.  PMH is significant for HTN, HLD, T2DM.   Patient was referred and last seen by Primary Care Provider, Dr. Yetta Barre, on 12/23/23. Patient was last seen by me on 01/26/24.  At last visit, increased amlodipine dose to 10 mg.   Today, patient arrives in good spirits and presents without any assistance. Denies dizziness, headache, blurred vision, swelling.   Patient reports hypertension was diagnosed in ~1999.   Medication adherence good . Patient has taken BP medications today.   Current antihypertensives include: amlodipine 10 mg, hydrochlorothiazide 25 mg  Antihypertensives tried in the past include: olmesartan (patient reported never taking due to concern of seizures)  Reported home BP readings: ~127-132/74  Patient reported dietary habits: patient reports eating less pork products (counseled on limiting salt intake with pork), eats with rice  O:  Review of Systems  All other systems reviewed and are negative.   Physical Exam Vitals reviewed.  Constitutional:      Appearance: Normal appearance.  Pulmonary:     Effort: Pulmonary effort is normal.  Neurological:     Mental Status: She is alert.  Psychiatric:        Mood and Affect: Mood normal.        Behavior: Behavior normal.        Thought Content: Thought content normal.        Judgment: Judgment normal.     Last 3 Office BP readings: BP Readings from Last 3 Encounters:  01/26/24 136/75  01/25/24 (!) 166/100  01/18/24 (!) 146/88    BMET    Component Value Date/Time   NA 146 (H) 01/13/2024 0906   K 4.3 01/13/2024 0906   CL 105 01/13/2024 0906   CO2 24 01/13/2024 0906   GLUCOSE 85 01/13/2024 0906   GLUCOSE 113 (H) 12/01/2023 0932   BUN 7 (L) 01/13/2024 0906   CREATININE 0.70 01/13/2024 0906   CREATININE 0.74 09/11/2016 1540    CALCIUM 9.1 01/13/2024 0906   GFRNONAA >60 12/01/2023 0932   GFRAA 111 10/05/2020 1026   Clinical ASCVD: No  The 10-year ASCVD risk score (Arnett DK, et al., 2019) is: 16.9%   Values used to calculate the score:     Age: 41 years     Sex: Female     Is Non-Hispanic African American: Yes     Diabetic: Yes     Tobacco smoker: No     Systolic Blood Pressure: 136 mmHg     Is BP treated: Yes     HDL Cholesterol: 51 mg/dL     Total Cholesterol: 133 mg/dL  A/P: Hypertension diagnosed ~26 years (1999) currently uncontrolled on current medications amlodipine 10 mg and hydrochlorothiazide 25 mg, although blood pressure readings at home appear near goal (~127-132/74). BP goal < 130/80 mmHg. Medication adherence appears good. Control is suboptimal due to dietary habits and lifestyle. Patient endorses motivation to exercise 1 hour per day 3 days per week. Patient's goal weight by next appointment is <245 lb. -Continued amlodipine 10 mg.  -Continued hydrochlorothiazide 25 mg. -Patient encouraged to research side effect profile of valsartan, olmesartan, and eplerenone for possible initiation upon next visit. -Patient educated on purpose, proper use, and potential adverse effects of.  -Counseled on lifestyle modifications for blood pressure control including reduced dietary sodium, increased exercise, adequate sleep. -Encouraged  patient to check BP at home and bring log of readings to next visit. Counseled on proper use of home BP cuff.   Hyperlipidemia. Last LDL is 67 (2023) not at goal of <55 mg/dL. ASCVD risk factors include HTN, T2DM and 10-year ASCVD risk score of 16.9%. High intensity statin indicated. Patient reports taking atorvastatin 10 mg in the past, but stopped taking medication due to no refills provided in the pharmacy. -Restarted atorvastatin 40 mg.   Results reviewed and written information provided.    Written patient instructions provided. Patient verbalized understanding of  treatment plan.  Total time in face to face counseling 33 minutes.    Follow-up:  Pharmacist Dr. Raymondo Band 03/14/24. PCP clinic visit PRN.  Patient seen with Threasa Heads, PharmD Candidate and Darolyn Rua, PharmD Candidate.

## 2024-02-16 NOTE — Assessment & Plan Note (Signed)
 Hyperlipidemia. Last LDL is 67 (2023) not at goal of <55 mg/dL. ASCVD risk factors include HTN, T2DM and 10-year ASCVD risk score of 16.9%. High intensity statin indicated. Patient reports taking atorvastatin 10 mg in the past, but stopped taking medication due to no refills provided in the pharmacy. -Restarted atorvastatin 40 mg.

## 2024-02-18 NOTE — Progress Notes (Signed)
 Reviewed and agree with Dr Macky Lower plan.

## 2024-03-09 ENCOUNTER — Other Ambulatory Visit: Payer: Self-pay | Admitting: Student

## 2024-03-09 DIAGNOSIS — E119 Type 2 diabetes mellitus without complications: Secondary | ICD-10-CM

## 2024-03-14 ENCOUNTER — Ambulatory Visit: Admitting: Pharmacist

## 2024-03-14 ENCOUNTER — Encounter: Payer: Self-pay | Admitting: Pharmacist

## 2024-03-14 VITALS — BP 148/84 | HR 72 | Wt 256.0 lb

## 2024-03-14 DIAGNOSIS — I10 Essential (primary) hypertension: Secondary | ICD-10-CM

## 2024-03-14 DIAGNOSIS — E119 Type 2 diabetes mellitus without complications: Secondary | ICD-10-CM

## 2024-03-14 MED ORDER — SEMAGLUTIDE(0.25 OR 0.5MG/DOS) 2 MG/3ML ~~LOC~~ SOPN: 0.2500 mg | PEN_INJECTOR | SUBCUTANEOUS | 1 refills | Status: DC

## 2024-03-14 MED ORDER — PEN NEEDLES 32G X 4 MM MISC: 11 refills | Status: AC

## 2024-03-14 NOTE — Progress Notes (Signed)
 S:     Chief Complaint  Patient presents with   Medication Management    Blood Pressure - Weight   68 y.o. female who presents for hypertension evaluation, education, and management.  PMH is significant for HTN, HLD, T2DM.    Patient was referred and last seen by Primary Care Provider, Dr. Rochelle Chu, on 12/23/2023.  Last seen with me on 02/16/2024  At last visit, we discussed home BP monitoring and return visit to discuss need for additional therapy to treat hypertension.   Today, patient arrives in spirits and presents without assistance. Denies dizziness, headache, blurred vision, swelling.   Patient reports hypertension was diagnosed ~1999.    Medication adherence good however, patient admits to not taking antihypertensive medications today.   Current antihypertensives include: amlodipine  10 mg, hydrochlorothiazide  25 mg   Patient brought HOME blood pressure monitor and showed results of: 127/75 for the average of the last three readings.  While in office  the home monitor showed an elevated blood pressure of ~ 160 systolic which was similar to in office automated assessment.    Patient-reported exercise habits: 3X per week (60 minutes total) with 30 minutes bike and 30 minutes treadmill.   Reports diminished intake of bread, rice and potato however "chips" continue to be a source of dietary indiscretion.  Reports decrease in salt intake.    In-office reading with HOME Monitor - Left arm seated:   160/109  O:  Review of Systems  All other systems reviewed and are negative.   Physical Exam Vitals reviewed.  Constitutional:      Appearance: Normal appearance.  Pulmonary:     Effort: Pulmonary effort is normal.  Neurological:     Mental Status: She is alert.  Psychiatric:        Mood and Affect: Mood normal.        Behavior: Behavior normal.        Thought Content: Thought content normal.        Judgment: Judgment normal.     Last 3 Office BP readings: BP Readings  from Last 3 Encounters:  02/16/24 (!) 140/81  01/26/24 136/75  01/25/24 (!) 166/100    BMET    Component Value Date/Time   NA 146 (H) 01/13/2024 0906   K 4.3 01/13/2024 0906   CL 105 01/13/2024 0906   CO2 24 01/13/2024 0906   GLUCOSE 85 01/13/2024 0906   GLUCOSE 113 (H) 12/01/2023 0932   BUN 7 (L) 01/13/2024 0906   CREATININE 0.70 01/13/2024 0906   CREATININE 0.74 09/11/2016 1540   CALCIUM  9.1 01/13/2024 0906   GFRNONAA >60 12/01/2023 0932   GFRAA 111 10/05/2020 1026    Renal function: CrCl cannot be calculated (Patient's most recent lab result is older than the maximum 21 days allowed.).  Clinical ASCVD: No  The 10-year ASCVD risk score (Arnett DK, et al., 2019) is: 18%   Values used to calculate the score:     Age: 46 years     Sex: Female     Is Non-Hispanic African American: Yes     Diabetic: Yes     Tobacco smoker: No     Systolic Blood Pressure: 140 mmHg     Is BP treated: Yes     HDL Cholesterol: 51 mg/dL     Total Cholesterol: 133 mg/dL  A/P: Hypertension diagnosed ~26 years (1999) currently improved with use of amlodipine  10 mg and hydrochlorothiazide  25 mg, Home blood pressure readings at home appear near goal (~  122-132/68). BP goal < 130/80 mmHg. Medication adherence appears good. Control is suboptimal due to dietary habits and lifestyle. Patient endorses motivation to exercise 1 hour per day 3 days per week. Patient's goal weight by next appointment is <245 lb. -Continued amlodipine  10 mg.  -Continued hydrochlorothiazide  25 mg No new medications today  -Counseled on lifestyle modifications for blood pressure control including reduced dietary sodium, increased exercise, adequate sleep. -Encouraged patient to continue with checking BP at home and bringing log of readings or devise with memory to next visit.   Diabetes - fairly well controlled based on A1C of 6.7 however, patient expresses frustration with lack of success with weight loss despite efforts with  both diet and exercise over the last 2-3 months.   -Continue metformin  500mg  BID -Initiate Ozempic  (semaglutide ) 0.25mg  weekly.  Patient educated on purpose, proper use and potential adverse effects of nausea. Following instruction patient verbalized understanding of treatment plan.    Results reviewed and written information provided.    Written patient instructions provided. Patient verbalized understanding of treatment plan.  Total time in face to face counseling 32 minutes.    Follow-up:  Pharmacist 04/12/2024. PCP clinic visit in 05/11/2024.  Patient seen with Jeanine Millers, PharmD Candidate.

## 2024-03-14 NOTE — Assessment & Plan Note (Signed)
 Hypertension diagnosed ~26 years (1999) currently improved with use of amlodipine  10 mg and hydrochlorothiazide  25 mg, Home blood pressure readings at home appear near goal (~122-132/68). BP goal < 130/80 mmHg. Medication adherence appears good. Control is suboptimal due to dietary habits and lifestyle. Patient endorses motivation to exercise 1 hour per day 3 days per week. Patient's goal weight by next appointment is <245 lb. -Continued amlodipine  10 mg.  -Continued hydrochlorothiazide  25 mg No new medications today  -Counseled on lifestyle modifications for blood pressure control including reduced dietary sodium, increased exercise, adequate sleep.

## 2024-03-14 NOTE — Assessment & Plan Note (Signed)
 Diabetes - fairly well controlled based on A1C of 6.7 however, patient expresses frustration with lack of success with weight loss despite efforts with both diet and exercise over the last 2-3 months.   -Continue metformin  500mg  BID -Initiate Ozempic  (semaglutide ) 0.25mg  weekly.  Patient educated on purpose, proper use and potential adverse effects of nausea. Following instruction patient verbalized understanding of treatment plan.

## 2024-03-14 NOTE — Patient Instructions (Signed)
 It was nice to see you today!  Your goal blood pressure is <130/80 mmHg.  Medication Changes: START Ozempic  (semaglutide ) 0.25 mg - inject into skin once a week.  Continue all other medication the same.   Monitor blood pressure at home daily and keep a log (on your phone or piece of paper) to bring with you to your next visit. Write down date, time, blood pressure and pulse.  Keep up the good work with diet and exercise. Aim for a diet full of vegetables, fruit and lean meats (chicken, [place], fish). Try to limit salt intake by eating fresh or frozen vegetables (instead of canned), rinse canned vegetables prior to cooking and do not add any additional salt to meals.

## 2024-03-18 NOTE — Progress Notes (Signed)
 Reviewed and agree with Dr Macky Lower plan.

## 2024-04-12 ENCOUNTER — Ambulatory Visit (INDEPENDENT_AMBULATORY_CARE_PROVIDER_SITE_OTHER): Admitting: Pharmacist

## 2024-04-12 ENCOUNTER — Encounter: Payer: Self-pay | Admitting: Pharmacist

## 2024-04-12 VITALS — BP 138/87 | HR 73 | Wt 247.2 lb

## 2024-04-12 DIAGNOSIS — E119 Type 2 diabetes mellitus without complications: Secondary | ICD-10-CM | POA: Diagnosis not present

## 2024-04-12 DIAGNOSIS — E785 Hyperlipidemia, unspecified: Secondary | ICD-10-CM | POA: Diagnosis not present

## 2024-04-12 DIAGNOSIS — I10 Essential (primary) hypertension: Secondary | ICD-10-CM

## 2024-04-12 MED ORDER — SEMAGLUTIDE(0.25 OR 0.5MG/DOS) 2 MG/3ML ~~LOC~~ SOPN: 0.2500 mg | PEN_INJECTOR | SUBCUTANEOUS | 1 refills | Status: DC

## 2024-04-12 NOTE — Assessment & Plan Note (Signed)
 ASCVD risk - primary prevention in patient with diabetes. Last LDL is 67 mg/dL at goal of <60 mg/dL. ASCVD risk factors include HTN, T2DM, HLD, and 10-year ASCVD risk score of 18.2%. high intensity statin indicated.  -Continued atorvastatin  40 mg daily

## 2024-04-12 NOTE — Patient Instructions (Signed)
 It was nice to see you today!  Your goal blood sugar is 80-130 before eating and less than 180 after eating.  Medication Changes: Increase Flonase  (fluticasone ) nasal sprays two (2) sprays into each nose two (2) times a day  Continue Ozempic  (semaglutide ) 0.25 mg injection every Saturday  Continue all other medication the same.   Keep up the good work with diet and exercise. Aim for a diet full of vegetables, fruit and lean meats (chicken, [place], fish). Try to limit salt intake by eating fresh or frozen vegetables (instead of canned), rinse canned vegetables prior to cooking and do not add any additional salt to meals.

## 2024-04-12 NOTE — Progress Notes (Signed)
 S:     Chief Complaint  Patient presents with   Medication Management    Ozempic  follow-up   68 y.o. female who presents for diabetes evaluation, education, and management. Patient arrives in good spirits, but reports increasing allergy symptoms. She is happy with her recent weight loss, she reports a goal weight of  less than 200 lbs. PMH is significant for HTN, HLD, T2DM.   Patient was referred and last seen by Primary Care Provider, Dr. Rochelle Chu, last seen on 12/23/2023.  At last visit pharmacy visit on 03/15/24, patient was started on Ozempic  (semaglutide ).   Current diabetes medications include: metformin  500 mg BID, Ozempic  (semaglutide ) 0.25 mg Current hypertension medications include: amlodipine  10 mg, hydrochlorothiazide  25 mg Current hyperlipidemia medications include: atorvastatin  40 mg  Patient reports adherence to taking all medications as prescribed.   Do you feel that your medications are working for you? yes Have you been experiencing any side effects to the medications prescribed? no Do you have any problems obtaining medications due to transportation or finances? no Insurance coverage: [employer] Medicare  Patient denies hypoglycemic events.  Patient reported dietary habits: Eats 2 meals/day Breakfast: boiled eggs, half a bagel Dinner: Eating more vegetables, chicken breast, [place] wings Snacks: rice cakes, carrots, celery sticks Drinks: lemon water , coffee  Patient-reported exercise habits: goes to gym 3x per week  O:   Review of Systems  Constitutional:  Positive for malaise/fatigue.  HENT:  Positive for congestion.     Physical Exam Constitutional:      Appearance: Normal appearance.  Pulmonary:     Effort: Pulmonary effort is normal.  Neurological:     Mental Status: She is alert.  Psychiatric:        Mood and Affect: Mood normal.        Behavior: Behavior normal.        Thought Content: Thought content normal.      Lab Results  Component  Value Date   HGBA1C 6.7 12/23/2023   Vitals:   04/12/24 0831 04/12/24 0836  BP: (!) 143/89 138/87  Pulse: 72 73  SpO2: 99% 100%    Lipid Panel     Component Value Date/Time   CHOL 133 09/30/2022 0943   TRIG 74 09/30/2022 0943   HDL 51 09/30/2022 0943   CHOLHDL 2.6 09/30/2022 0943   CHOLHDL 3.5 09/11/2016 1540   VLDL 12 09/11/2016 1540   LDLCALC 67 09/30/2022 0943    Clinical Atherosclerotic Cardiovascular Disease (ASCVD): No  The 10-year ASCVD risk score (Arnett DK, et al., 2019) is: 18.2%   Values used to calculate the score:     Age: 17 years     Sex: Female     Is Non-Hispanic African American: Yes     Diabetic: Yes     Tobacco smoker: No     Systolic Blood Pressure: 138 mmHg     Is BP treated: Yes     HDL Cholesterol: 51 mg/dL     Total Cholesterol: 133 mg/dL   A/P: Diabetes longstanding since 2022 currently well-controlled, A1C 6.7% (12/23/23). Patient is able to verbalize appropriate hypoglycemia management plan. Medication adherence appears good. Patient reports increased satiety since starting Ozempic  (semaglutide ), resulting in 9 lb weight loss since last appointment (256 lbs to 247 lbs). -Continued GLP-1, Ozempic  (semaglutide ) 0.25 mg weekly. Consider increasing dose if patient does not continue to lose weight. -Continued metformin  500 mg BID.  -Patient educated on purpose, proper use, and potential adverse effects.  -Extensively discussed  pathophysiology of diabetes, recommended lifestyle interventions, dietary effects on blood sugar control.  -Counseled on s/sx of and management of hypoglycemia.  -Next A1c anticipated August 2025.   ASCVD risk - primary prevention in patient with diabetes. Last LDL is 67 mg/dL at goal of <29 mg/dL. ASCVD risk factors include HTN, T2DM, HLD, and 10-year ASCVD risk score of 18.2%. high intensity statin indicated.  -Continued atorvastatin  40 mg daily  Hypertension diagnosed ~26 years ago (1999) currently with improved control, BP  138/87 mmHg today. Blood pressure goal of <130/80 mmHg. Medication adherence appears good. Blood pressure control improving with recent weight loss.  -Continued amlodipine  10 mg daily -Continued hydrochlorothiazide  25 mg daily  Allergic Rhinitis - chronic recently experiencing worsening symptoms.  Improved with use of previously prescribed antihistamine and intranasal steroid.  -Discussed increasing frequency of intranasal fluticasone  to 2 sprays each nostril BID (rather than daily).    Written patient instructions provided. Patient verbalized understanding of treatment plan.  Total time in face to face counseling 27 minutes.    Follow-up:  Pharmacist July or August 2025 PCP clinic visit in 05/11/24 Patient seen with Noah [place], PharmD Candidate and Volney Grumbles, PharmD, PGY-1 pharmacy resident.

## 2024-04-12 NOTE — Assessment & Plan Note (Signed)
 Hypertension diagnosed ~26 years ago (1999) currently with improved control, BP 138/87 mmHg today. Blood pressure goal of <130/80 mmHg. Medication adherence appears good. Blood pressure control improving with recent weight loss.  -Continued amlodipine  10 mg daily -Continued hydrochlorothiazide  25 mg daily

## 2024-04-12 NOTE — Progress Notes (Signed)
 Reviewed and agree with Dr Macky Lower plan.

## 2024-04-12 NOTE — Assessment & Plan Note (Signed)
 Diabetes longstanding since 2022 currently well-controlled, A1C 6.7% (12/23/23). Patient is able to verbalize appropriate hypoglycemia management plan. Medication adherence appears good. Patient reports increased satiety since starting Ozempic  (semaglutide ), resulting in 9 lb weight loss since last appointment (256 lbs to 247 lbs). -Continued GLP-1, Ozempic  (semaglutide ) 0.25 mg weekly. Consider increasing dose if patient does not continue to lose weight. -Continued metformin  500 mg BID.  -Patient educated on purpose, proper use, and potential adverse effects.  -Extensively discussed pathophysiology of diabetes, recommended lifestyle interventions, dietary effects on blood sugar control.

## 2024-04-15 ENCOUNTER — Other Ambulatory Visit: Payer: Self-pay | Admitting: Family Medicine

## 2024-04-15 ENCOUNTER — Other Ambulatory Visit: Payer: Self-pay | Admitting: Student

## 2024-04-16 ENCOUNTER — Telehealth: Payer: Self-pay | Admitting: Student

## 2024-04-16 DIAGNOSIS — I2699 Other pulmonary embolism without acute cor pulmonale: Secondary | ICD-10-CM

## 2024-04-16 MED ORDER — APIXABAN 5 MG PO TABS: 5.0000 mg | ORAL_TABLET | Freq: Two times a day (BID) | ORAL | 1 refills | Status: DC

## 2024-04-16 NOTE — Telephone Encounter (Signed)
 [facility] After Hours Line Phone Call Received after hours phone from patient. Called patient and confirmed name and DOB.   Patient reports that her Eliquis  was not refilled yesterday. She called the pharmacy who said they did not receive the script.   Will resend Eliquis  to pharmacy.   Clem Currier, DO [facility], PGY-2 04/16/24 9:19 AM

## 2024-04-27 ENCOUNTER — Other Ambulatory Visit: Payer: Self-pay | Admitting: Oncology

## 2024-04-27 DIAGNOSIS — I2699 Other pulmonary embolism without acute cor pulmonale: Secondary | ICD-10-CM

## 2024-05-02 ENCOUNTER — Inpatient Hospital Stay: Payer: Medicare HMO | Attending: Oncology

## 2024-05-02 ENCOUNTER — Inpatient Hospital Stay (HOSPITAL_BASED_OUTPATIENT_CLINIC_OR_DEPARTMENT_OTHER): Payer: Medicare HMO | Admitting: Oncology

## 2024-05-02 VITALS — BP 137/85 | HR 80 | Temp 98.2°F | Resp 18 | Ht 66.0 in | Wt 248.1 lb

## 2024-05-02 DIAGNOSIS — Z0000 Encounter for general adult medical examination without abnormal findings: Secondary | ICD-10-CM

## 2024-05-02 DIAGNOSIS — Z09 Encounter for follow-up examination after completed treatment for conditions other than malignant neoplasm: Secondary | ICD-10-CM

## 2024-05-02 DIAGNOSIS — Z7901 Long term (current) use of anticoagulants: Secondary | ICD-10-CM | POA: Insufficient documentation

## 2024-05-02 DIAGNOSIS — Z86711 Personal history of pulmonary embolism: Secondary | ICD-10-CM

## 2024-05-02 DIAGNOSIS — I2699 Other pulmonary embolism without acute cor pulmonale: Secondary | ICD-10-CM

## 2024-05-02 LAB — CBC WITH DIFFERENTIAL (CANCER CENTER ONLY)
Abs Immature Granulocytes: 0.07 10*3/uL (ref 0.00–0.07)
Basophils Absolute: 0 10*3/uL (ref 0.0–0.1)
Basophils Relative: 1 %
Eosinophils Absolute: 0.1 10*3/uL (ref 0.0–0.5)
Eosinophils Relative: 1 %
HCT: 40.4 % (ref 36.0–46.0)
Hemoglobin: 13.2 g/dL (ref 12.0–15.0)
Immature Granulocytes: 1 %
Lymphocytes Relative: 37 %
Lymphs Abs: 2.7 10*3/uL (ref 0.7–4.0)
MCH: 28.4 pg (ref 26.0–34.0)
MCHC: 32.7 g/dL (ref 30.0–36.0)
MCV: 87.1 fL (ref 80.0–100.0)
Monocytes Absolute: 0.6 10*3/uL (ref 0.1–1.0)
Monocytes Relative: 8 %
Neutro Abs: 3.8 10*3/uL (ref 1.7–7.7)
Neutrophils Relative %: 52 %
Platelet Count: 295 10*3/uL (ref 150–400)
RBC: 4.64 MIL/uL (ref 3.87–5.11)
RDW: 14.2 % (ref 11.5–15.5)
WBC Count: 7.3 10*3/uL (ref 4.0–10.5)
nRBC: 0 % (ref 0.0–0.2)

## 2024-05-02 LAB — D-DIMER, QUANTITATIVE: D-Dimer, Quant: <0.27 ug{FEU}/mL (ref 0.00–0.50)

## 2024-05-02 NOTE — Assessment & Plan Note (Addendum)
 Pulmonary embolism diagnosed in December 2024, likely triggered by prolonged immobility during a long road trip.  -She had testing done for hypercoagulable workup in the ED.  No evidence of factor V Leiden mutation, prothrombin gene mutation.  Protein C activity, protein S activity, Antithrombin III  activity were all within normal limits.  Beta-2  glycoprotein antibodies and lupus anticoagulant were negative.  Anticardiolipin IgM was indeterminate at 20.  This needs to be repeated at least 3 months later.  No underlying hypercoagulable condition identified.   -She has been compliant with Eliquis  5 mg twice daily and tolerating this well without major side effects.  -D-dimer was undetectable on last visit and again today, indicating adequate anticoagulation.  Plan was to continue Eliquis  at current dosing for total of 6 months.  She has been on anticoagulation therapy for nearly six months. Current blood counts are normal, with no symptoms of chest pain or dyspnea. Transitioning to aspirin is deemed safe after six months of anticoagulation due to the small clot size and absence of ongoing risk factors. - Discontinue anticoagulation therapy after six months. - Initiate Aspirin 81 mg, preferably enteric-coated to reduce gastrointestinal side effects. - Monitor for symptoms such as chest pain, dyspnea, dizziness, stroke-like symptoms, leg swelling, pain, or erythema and seek emergency care if she occurs. - Maintain adequate hydration while on aspirin to protect renal function.  -Educated on importance of taking breaks at least every 2 hours when on long road trip or long flight.  She verbalized understanding.  She was also advised to maintain adequate hydration.  We repeated anticardiolipin antibodies today.  Will follow-up on results.  RTC in 4 months for routine follow-up.  No need for labs on return visit.

## 2024-05-02 NOTE — Progress Notes (Signed)
 [facility]  HEMATOLOGY CLINIC PROGRESS NOTE  PATIENT NAME: Janet Woods   MR#: [id number] DOB: 1956/03/15  Patient Care Team: Glenn Lange, DO as PCP - General (Family Medicine)  Date of visit: 05/02/2024   ASSESSMENT & PLAN:   Janet Woods is a 68 y.o.  lady with a past medical history of Type 2 DM, HTN, arthritis, was referred to our service for evaluation after she was found to have right sided pulmonary embolism.   Grossly negative thrombophilia workup.  History of pulmonary embolus (PE) Pulmonary embolism diagnosed in December 2024, likely triggered by prolonged immobility during a long road trip.  -She had testing done for hypercoagulable workup in the ED.  No evidence of factor V Leiden mutation, prothrombin gene mutation.  Protein C activity, protein S activity, Antithrombin III  activity were all within normal limits.  Beta-2  glycoprotein antibodies and lupus anticoagulant were negative.  Anticardiolipin IgM was indeterminate at 20.  This needs to be repeated at least 3 months later.  No underlying hypercoagulable condition identified.   -She has been compliant with Eliquis  5 mg twice daily and tolerating this well without major side effects.  -D-dimer was undetectable on last visit and again today, indicating adequate anticoagulation.  Plan was to continue Eliquis  at current dosing for total of 6 months.  She has been on anticoagulation therapy for nearly six months. Current blood counts are normal, with no symptoms of chest pain or dyspnea. Transitioning to aspirin is deemed safe after six months of anticoagulation due to the small clot size and absence of ongoing risk factors. - Discontinue anticoagulation therapy after six months. - Initiate Aspirin 81 mg, preferably enteric-coated to reduce gastrointestinal side effects. - Monitor for symptoms such as chest pain, dyspnea, dizziness, stroke-like symptoms, leg swelling, pain, or erythema and seek  emergency care if she occurs. - Maintain adequate hydration while on aspirin to protect renal function.  -Educated on importance of taking breaks at least every 2 hours when on long road trip or long flight.  She verbalized understanding.  She was also advised to maintain adequate hydration.  We repeated anticardiolipin antibodies today.  Will follow-up on results.  RTC in 4 months for routine follow-up.  No need for labs on return visit.  Healthcare maintenance -She is up-to-date on mammogram.  -Last colonoscopy was 3 years ago which showed benign polyps and patient is due to receive another one later this year.    I spent a total of 20 minutes during this encounter with the patient including review of chart and various tests results, discussions about plan of care and coordination of care plan.  I reviewed lab results and outside records for this visit and discussed relevant results with the patient. Diagnosis, plan of care and treatment options were also discussed in detail with the patient. Opportunity provided to ask questions and answers provided to her apparent satisfaction. Provided instructions to call our clinic with any problems, questions or concerns prior to return visit. I recommended to continue follow-up with PCP and sub-specialists. She verbalized understanding and agreed with the plan. No barriers to learning was detected.  Arlo Berber, MD  05/02/2024 11:32 AM  [facility] - A DEPT OF [facility] [id number] [place] [address] Dept: [phone] Dept Fax: [phone]   CHIEF COMPLAINT/ REASON FOR VISIT:  History of pulmonary embolism diagnosed in December 2024, likely provoked by prolonged immobility during a  long road trip.  Negative thrombophilia workup.  INTERVAL HISTORY:  Discussed the use of AI scribe software for clinical note transcription with the patient, who gave verbal  consent to proceed.  History of Present Illness Janet Woods is a 68 year old female who presents for follow-up regarding anticoagulation therapy.  She has been on anticoagulation therapy for nearly six months following the diagnosis of a blood clot in December. She is doing well on the medication without experiencing any chest pain, dyspnea, or unusual symptoms. She is currently taking blood thinners and plans to refill her prescription on the 30th. She has not taken aspirin before.  Prior to the clot, she had been on a long road trip, which was considered a potential contributing factor. A comprehensive blood workup was conducted to assess clotting risk, which returned negative except for a borderline anti-cardiolipin antibody test. This test was repeated today.  No chest pain, dyspnea, dizziness, stroke-like symptoms, leg swelling, pain, or redness.   SUMMARY OF HEMATOLOGIC HISTORY:  She underwent robotic assisted total hysterectomy and bilateral salpingo-oophorectomy in July 2024.She reports no bleeding prior to the hysterectomy and the procedure was performed due to an aging cervix. Postoperatively, she was discharged the same day and did well until she presented to the emergency room twice in December 2024 with back pain. She was not admitted but underwent CT angiogram of the chest on 10/25/2023 which showed a small right lower lobe pulmonary embolus without significant right heart strain.  She was started on Eliquis  for the PE. She reports no chest pain but has had previous issues with sciatica. 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 She took a long road trip to [place]  and back after Thanksgiving, stopping only once or twice at rest stops for breaks. She reports no leg pain or redness.   She also reports globus sensation in her throat and has been referred to ENT and has an appointment with Dr. Soldatova on 12/17/2023.  She had testing done for hypercoagulable  workup in the ED.  No evidence of factor V Leiden mutation, prothrombin gene mutation.  Protein C activity, protein S activity, Antithrombin III  activity were all within normal limits.  Beta-2  glycoprotein antibodies and lupus anticoagulant were negative.  Anticardiolipin IgM was indeterminate at 20.  This needs to be repeated at least 3 months later.  No underlying hypercoagulable condition identified.    She has been compliant with Eliquis  5 mg twice daily and tolerating this well without major side effects.   D-dimer is undetectable, indicating adequate anticoagulation.  Plan to continue Eliquis  at current dosing for total of 6 months.   Educated on importance of taking breaks at least every 2 hours when on long road trip or long flight.  She verbalized understanding.  She was also advised to maintain adequate hydration.   I have reviewed the past medical history, past surgical history, social history and family history with the patient and they are unchanged from previous note.  ALLERGIES: She has no known allergies.  MEDICATIONS:  Current Outpatient Medications  Medication Sig Dispense Refill   acetaminophen  (TYLENOL ) 500 MG tablet Take 500 mg by mouth every 6 (six) hours as needed for mild pain (pain score 1-3).     amLODipine  (NORVASC ) 10 MG tablet Take 1 tablet (10 mg total) by mouth at bedtime. 90 tablet 3   amLODipine  (NORVASC ) 5 MG tablet Take 5 mg by mouth daily.     apixaban  (ELIQUIS ) 5 MG  TABS tablet Take 1 tablet (5 mg total) by mouth 2 (two) times daily. 60 tablet 1   atorvastatin  (LIPITOR) 40 MG tablet Take 1 tablet (40 mg total) by mouth daily. 90 tablet 3   Calcium  250 MG CAPS Take 1 capsule by mouth 2 (two) times daily with a meal. 180 capsule 3   Cholecalciferol (VITAMIN D3) 20 MCG (800 UNIT) TABS Take 1 tablet by mouth daily. 90 tablet 3   fluticasone  (FLONASE ) 50 MCG/ACT nasal spray Place 2 sprays into both nostrils daily. 16 g 6   gabapentin  (NEURONTIN ) 100 MG capsule  Take 1 capsule (100 mg total) by mouth 3 (three) times daily as needed. 30 capsule 0   hydrochlorothiazide  (HYDRODIURIL ) 25 MG tablet TAKE 1 TABLET(25 MG) BY MOUTH DAILY 90 tablet 3   Insulin  Pen Needle (PEN NEEDLES) 32G X 4 MM MISC Use as directed 100 each 11   lidocaine  (LIDODERM ) 5 % Place 1 patch onto the skin daily. Remove & Discard patch within 12 hours or as directed by MD 30 patch 0   loratadine (CLARITIN) 10 MG tablet Take 10 mg by mouth daily.     metFORMIN  (GLUCOPHAGE -XR) 500 MG 24 hr tablet TAKE 1 TABLET(500 MG) BY MOUTH TWICE DAILY WITH A MEAL 180 tablet 3   Semaglutide ,0.25 or 0.5MG /DOS, 2 MG/3ML SOPN Inject 0.25 mg into the skin once a week. 3 mL 1   No current facility-administered medications for this visit.     REVIEW OF SYSTEMS:    Review of Systems - Oncology  All other pertinent systems were reviewed with the patient and are negative.  PHYSICAL EXAMINATION:   Onc Performance Status - 05/02/24 1101       ECOG Perf Status   ECOG Perf Status Fully active, able to carry on all pre-disease performance without restriction      KPS SCALE   KPS % SCORE Normal, no compliants, no evidence of disease          Vitals:   05/02/24 1032  BP: 137/85  Pulse: 80  Resp: 18  Temp: 98.2 F (36.8 C)  SpO2: 97%   Filed Weights   05/02/24 1032  Weight: 248 lb 1.6 oz (112.5 kg)    Physical Exam Constitutional:      General: She is not in acute distress.    Appearance: Normal appearance.  HENT:     Head: Normocephalic and atraumatic.   Cardiovascular:     Rate and Rhythm: Normal rate.  Pulmonary:     Effort: Pulmonary effort is normal. No respiratory distress.  Abdominal:     General: There is no distension.   Neurological:     General: No focal deficit present.     Mental Status: She is alert and oriented to person, place, and time.   Psychiatric:        Mood and Affect: Mood normal.        Behavior: Behavior normal.      LABORATORY DATA:   I have  reviewed the data as listed.  Results for orders placed or performed in visit on 05/02/24  Cardiolipin antibodies, IgG, IgM, IgA  Result Value Ref Range   Anticardiolipin IgG <9 0 - 14 GPL U/mL   Anticardiolipin IgM 11 0 - 12 MPL U/mL   Anticardiolipin IgA <9 0 - 11 APL U/mL  D-dimer, quantitative  Result Value Ref Range   D-Dimer, Quant <0.27 0.00 - 0.50 ug/mL-FEU  CBC with Differential (Cancer Center Only)  Result Value  Ref Range   WBC Count 7.3 4.0 - 10.5 K/uL   RBC 4.64 3.87 - 5.11 MIL/uL   Hemoglobin 13.2 12.0 - 15.0 g/dL   HCT 29.5 62.1 - 30.8 %   MCV 87.1 80.0 - 100.0 fL   MCH 28.4 26.0 - 34.0 pg   MCHC 32.7 30.0 - 36.0 g/dL   RDW 65.7 84.6 - 96.2 %   Platelet Count 295 150 - 400 K/uL   nRBC 0.0 0.0 - 0.2 %   Neutrophils Relative % 52 %   Neutro Abs 3.8 1.7 - 7.7 K/uL   Lymphocytes Relative 37 %   Lymphs Abs 2.7 0.7 - 4.0 K/uL   Monocytes Relative 8 %   Monocytes Absolute 0.6 0.1 - 1.0 K/uL   Eosinophils Relative 1 %   Eosinophils Absolute 0.1 0.0 - 0.5 K/uL   Basophils Relative 1 %   Basophils Absolute 0.0 0.0 - 0.1 K/uL   Immature Granulocytes 1 %   Abs Immature Granulocytes 0.07 0.00 - 0.07 K/uL    RADIOGRAPHIC STUDIES:  No recent pertinent imaging studies available to review.  No orders of the defined types were placed in this encounter.    Future Appointments  Date Time Provider Department Center  05/11/2024  8:30 AM Glenn Lange, DO [facility]  07/11/2024  9:50 AM [facility] ANNUAL Brodie Cannon VISIT [facility]  09/01/2024  8:30 AM Emmett Bracknell, Gale Jude, MD [facility] None     This document was completed utilizing speech recognition software. Grammatical errors, random word insertions, pronoun errors, and incomplete sentences are an occasional consequence of this system due to software limitations, ambient noise, and hardware issues. Any formal questions or concerns about the content, text or information contained within the body of this dictation should be  directly addressed to the provider for clarification.

## 2024-05-02 NOTE — Assessment & Plan Note (Signed)
-  She is up-to-date on mammogram.  -Last colonoscopy was 3 years ago which showed benign polyps and patient is due to receive another one later this year.

## 2024-05-04 LAB — CARDIOLIPIN ANTIBODIES, IGG, IGM, IGA
Anticardiolipin IgA: <9 U/mL (ref 0–11)
Anticardiolipin IgG: <9 GPL U/mL (ref 0–14)
Anticardiolipin IgM: 11 [MPL'U]/mL (ref 0–12)

## 2024-05-05 ENCOUNTER — Encounter: Payer: Self-pay | Admitting: Oncology

## 2024-05-11 ENCOUNTER — Encounter: Payer: Self-pay | Admitting: Student

## 2024-05-11 ENCOUNTER — Ambulatory Visit: Admitting: Student

## 2024-05-11 VITALS — BP 131/85 | HR 70 | Ht 66.0 in | Wt 243.8 lb

## 2024-05-11 DIAGNOSIS — Z86711 Personal history of pulmonary embolism: Secondary | ICD-10-CM | POA: Diagnosis not present

## 2024-05-11 MED ORDER — ASPIRIN 81 MG PO TBEC: 81.0000 mg | DELAYED_RELEASE_TABLET | Freq: Every day | ORAL | 3 refills | Status: AC

## 2024-05-11 NOTE — Patient Instructions (Signed)
 It was great to see you! Thank you for allowing me to participate in your care!  I recommend that you always bring your medications to each appointment as this makes it easy to ensure you are on the correct medications and helps us  not miss when refills are needed.  Our plans for today:  - We discussed following the hematologists recommendations and stopping Eliquis  and start aspirin daily which has been sent to pharmacy  - Return in August for next A1c   Take care and seek immediate care sooner if you develop any concerns.   Dr. Lauraine Molt, DO [facility]

## 2024-05-11 NOTE — Progress Notes (Signed)
    SUBJECTIVE:   CHIEF COMPLAINT / HPI:   Janet Woods is a 67 year old female who presents for follow-up regarding anticoagulation management.  She experienced a pulmonary embolism in December 2024, likely provoked by a long road trip, and has been on Eliquis  for six months.  Extensive laboratory workup has been unrevealing.  she has had no difficulty breathing, chest pain, or leg swelling since the initial event.  In the last note, her hematologist stated would be safe to transition from Eliquis  to aspirin.  Patient presents today for further discussion about this.  PERTINENT  PMH / PSH: HTN, history of PE, T2DM, obesity  OBJECTIVE:   BP 131/85   Pulse 70   Ht 5' 6 (1.676 m)   Wt 243 lb 12.8 oz (110.6 kg)   SpO2 99%   BMI 39.35 kg/m    General: NAD, pleasant, well-appearing Cardiac: Well-perfused Respiratory: Normal effort Neuro: alert, no obvious focal deficits Psych: Normal affect and mood  ASSESSMENT/PLAN:   History of pulmonary embolus (PE) PE in December 2024, likely provoked by immobilization. Transition from Eliquis  to aspirin thought to be safe by hematologist. Blood work negative for clotting disorders.  Discussed risks and benefits of switching to aspirin vs staying on Eliquis .  Shared decision making used, patient prefers to transition to aspirin at this time. - Discontinue Eliquis . - Prescribe aspirin, enteric coated, 81 mg daily. - Monitor for side effects such as difficulty breathing, chest pain, or leg swelling. - Advise taking breaks every two hours during long trips as also discussed with her hematologist   Patient to return in August to meet her new PCP and for A1c Has consultation apt tomorrow for colonoscopy   Dr. Lauraine Molt, DO [facility]

## 2024-05-11 NOTE — Assessment & Plan Note (Signed)
 PE in December 2024, likely provoked by immobilization. Transition from Eliquis  to aspirin thought to be safe by hematologist. Blood work negative for clotting disorders.  Discussed risks and benefits of switching to aspirin vs staying on Eliquis .  Shared decision making used, patient prefers to transition to aspirin at this time. - Discontinue Eliquis . - Prescribe aspirin, enteric coated, 81 mg daily. - Monitor for side effects such as difficulty breathing, chest pain, or leg swelling. - Advise taking breaks every two hours during long trips as also discussed with her hematologist

## 2024-05-16 ENCOUNTER — Telehealth: Payer: Self-pay | Admitting: Student

## 2024-05-16 NOTE — Telephone Encounter (Signed)
 Red team can you call patient and clarify? Does this mean she needs a referral placed?  Typically the PA is done by the office doing the procedure.  If she needs a referral I am happy to place one.  Thanks Laymon JINNY Legions, MD

## 2024-05-16 NOTE — Telephone Encounter (Signed)
 Patient came in stating that she went to get her colonoscopy at [facility] this morning and was told that she needs to get a prior authorization from her insurance.

## 2024-05-16 NOTE — Telephone Encounter (Signed)
 Patient said the office told her she needs a prior serbia. Cassell Mary CMA

## 2024-05-18 NOTE — Telephone Encounter (Signed)
 RN team,  Can you please call [facility] and clarify what it is they need? If they need a referral, I will place a referral. If they need the colonoscopy procedure to undergo prior authorization, their office should complete the prior authorization as they are the ones doing the procedure.  Thank you, Laymon JINNY Legions, MD

## 2024-05-19 NOTE — Telephone Encounter (Signed)
 Called over to [facility]. They have handled the prior authorization.   Patient will be responsible for co-payment even with insurance approval of study. I was advised that patient could pay half on the day of service and then other payment could be arranged.   Called patient and informed of above information.   Patient appreciative. She will call back to [facility] with any further questions regarding this study.   Chiquita JAYSON English, RN

## 2024-05-23 DIAGNOSIS — H50811 Duane's syndrome, right eye: Secondary | ICD-10-CM | POA: Diagnosis not present

## 2024-05-23 DIAGNOSIS — H524 Presbyopia: Secondary | ICD-10-CM | POA: Diagnosis not present

## 2024-05-23 DIAGNOSIS — E119 Type 2 diabetes mellitus without complications: Secondary | ICD-10-CM | POA: Diagnosis not present

## 2024-05-23 DIAGNOSIS — H43391 Other vitreous opacities, right eye: Secondary | ICD-10-CM | POA: Diagnosis not present

## 2024-05-23 DIAGNOSIS — H25813 Combined forms of age-related cataract, bilateral: Secondary | ICD-10-CM | POA: Diagnosis not present

## 2024-05-23 LAB — HM DIABETES EYE EXAM

## 2024-06-02 ENCOUNTER — Ambulatory Visit: Admitting: Pharmacist

## 2024-06-02 ENCOUNTER — Encounter: Payer: Self-pay | Admitting: Pharmacist

## 2024-06-02 VITALS — BP 125/79 | HR 63 | Wt 245.0 lb

## 2024-06-02 DIAGNOSIS — E119 Type 2 diabetes mellitus without complications: Secondary | ICD-10-CM

## 2024-06-02 MED ORDER — SEMAGLUTIDE(0.25 OR 0.5MG/DOS) 2 MG/3ML ~~LOC~~ SOPN: 0.5000 mg | PEN_INJECTOR | SUBCUTANEOUS | 2 refills | Status: DC

## 2024-06-02 NOTE — Progress Notes (Signed)
 S:     Chief Complaint  Patient presents with   Medication Management    Diabetes - Weight Management   68 y.o. female who presents for diabetes evaluation, education, and management. Patient arrives in good spirits and presents without any assistance.   Patient was referred and last seen by Primary Care Provider, Dr. Joshua, on 05/11/2024.  At last visit with PCP, Eliquis  (apixaban ) was stopped and low-dose aspiring was started.   PMH is significant for diabetes, Hypertension, Allergic rhinitis.   At last visit pharmacy visit on 04/12/24, patient was continued on Ozempic  (semaglutide ) at the 0.25mg  dose as she had lost ~ 4 lbs in the preceding month.    Current diabetes medications include: metformin  500 mg BID, Ozempic  (semaglutide ) 0.25 mg weekly Current hypertension medications include: amlodipine  10 mg, hydrochlorothiazide  25 mg Current hyperlipidemia medications include: atorvastatin  40 mg   Patient reports adherence to taking all medications as prescribed.    Do you feel that your medications are working for you? yes Have you been experiencing any side effects to the medications prescribed? no Do you have any problems obtaining medications due to transportation or finances? no Insurance coverage: [facility] Medicare   Patient denies hypoglycemic events Patient is not using gabapentin  routinely, PRN only due to decrease in neuropathy.    Patient-reported exercise habits: goes to gym 3x per week   O:   Review of Systems  Constitutional:  Negative for malaise/fatigue.  Genitourinary:  Negative for frequency.  All other systems reviewed and are negative.   Physical Exam Vitals reviewed.  Constitutional:      Appearance: Normal appearance.  Pulmonary:     Effort: Pulmonary effort is normal.  Neurological:     Mental Status: She is alert.  Psychiatric:        Mood and Affect: Mood normal.        Thought Content: Thought content normal.       Lab Results   Component Value Date   HGBA1C 6.7 12/23/2023   Vitals:   06/02/24 0850  BP: 125/79  Pulse: 63  SpO2: 97%    Lipid Panel     Component Value Date/Time   CHOL 133 09/30/2022 0943   TRIG 74 09/30/2022 0943   HDL 51 09/30/2022 0943   CHOLHDL 2.6 09/30/2022 0943   CHOLHDL 3.5 09/11/2016 1540   VLDL 12 09/11/2016 1540   LDLCALC 67 09/30/2022 0943    Clinical Atherosclerotic Cardiovascular Disease (ASCVD): No The 10-year ASCVD risk score (Arnett DK, et al., 2019) is: 14.7%   Values used to calculate the score:     Age: 69 years     Clincally relevant sex: Female     Is Non-Hispanic African American: Yes     Diabetic: Yes     Tobacco smoker: No     Systolic Blood Pressure: 125 mmHg     Is BP treated: Yes     HDL Cholesterol: 51 mg/dL     Total Cholesterol: 133 mg/dL    A/P: Diabetes longstanding since 2022 currently well-controlled, A1C 6.7% (12/23/23) and patient denies any nocturia. Patient is able to verbalize appropriate hypoglycemia management plan. Medication adherence appears good. Patient reports tolerating Ozempic  (semaglutide ) at current 0.25mg  dose.  - Increased dose of GLP-1, Ozempic  (semaglutide ) from 0.25 mg to 0.5mg  weekly  to assist with weight loss goals.  Glucose control appears to be acceptable.  -Continued metformin  500 mg BID -Patient educated on purpose, proper use, and potential adverse effects.  -  Extensively discussed pathophysiology of diabetes, recommended lifestyle interventions, dietary effects on blood sugar control.  -Counseled on s/sx of and management of hypoglycemia.  -Next A1c anticipated August 2025  Blood pressure controlled No change in therapy.   Written patient instructions provided. Patient verbalized understanding of treatment plan.  Total time in face to face counseling 22 minutes.    Follow-up:  Pharmacist TBD PCP clinic visit in 07/04/2024

## 2024-06-02 NOTE — Patient Instructions (Signed)
.  It was nice to see you today!  Your goal blood sugar is 80-130 before eating and less than 180 after eating.  Medication Changes: Increase Ozempic  (semaglutide ) to 0.5mg  once weekly  Continue all other medication the same.   Keep up the good work with diet and exercise. Aim for a diet full of vegetables, fruit and lean meats (chicken, [place], fish). Try to limit salt intake by eating fresh or frozen vegetables (instead of canned), rinse canned vegetables prior to cooking and do not add any additional salt to meals.

## 2024-06-02 NOTE — Assessment & Plan Note (Signed)
 Diabetes longstanding since 2022 currently well-controlled, A1C 6.7% (12/23/23) and patient denies any nocturia. Patient is able to verbalize appropriate hypoglycemia management plan. Medication adherence appears good. Patient reports tolerating Ozempic  (semaglutide ) at current 0.25mg  dose.  - Increased dose of GLP-1, Ozempic  (semaglutide ) from 0.25 mg to 0.5mg  weekly  to assist with weight loss goals.  Glucose control appears to be acceptable.  -Continued metformin  500 mg BID -Patient educated on purpose, proper use, and potential adverse effects.  -Extensively discussed pathophysiology of diabetes, recommended lifestyle interventions, dietary effects on blood sugar control.  -Counseled on s/sx of and management of hypoglycemia.  -Next A1c anticipated August 2025

## 2024-06-03 ENCOUNTER — Ambulatory Visit: Admitting: Pharmacist

## 2024-06-03 NOTE — Progress Notes (Signed)
 Reviewed and agree with Dr Macky Lower plan.

## 2024-06-06 ENCOUNTER — Other Ambulatory Visit: Payer: Self-pay | Admitting: *Deleted

## 2024-06-06 DIAGNOSIS — I10 Essential (primary) hypertension: Secondary | ICD-10-CM

## 2024-06-07 MED ORDER — HYDROCHLOROTHIAZIDE 25 MG PO TABS: ORAL_TABLET | ORAL | 3 refills | Status: AC

## 2024-06-27 DIAGNOSIS — K635 Polyp of colon: Secondary | ICD-10-CM | POA: Diagnosis not present

## 2024-06-27 DIAGNOSIS — Z860101 Personal history of adenomatous and serrated colon polyps: Secondary | ICD-10-CM | POA: Diagnosis not present

## 2024-06-27 DIAGNOSIS — Z09 Encounter for follow-up examination after completed treatment for conditions other than malignant neoplasm: Secondary | ICD-10-CM | POA: Diagnosis not present

## 2024-06-27 LAB — HM COLONOSCOPY

## 2024-06-29 DIAGNOSIS — K635 Polyp of colon: Secondary | ICD-10-CM | POA: Diagnosis not present

## 2024-07-03 NOTE — Progress Notes (Signed)
    SUBJECTIVE:   CHIEF COMPLAINT / HPI:   VM is a 68yo F that pf f/u.  T2DM - Was previously increased on ozempic  with Dr. Koval - She held her ozempic  for 2 weeks for a colonoscopy, but has since restarted - No GI upset   - Also has chronic R wrist pain, she wears a brace at night time. She used to take naproxen , but wanted to stop due concern of stomach upset - She also uses lidocaine   PERTINENT  PMH / PSH: t2dm, HTN, BL carpal tunnel  OBJECTIVE:   BP 136/78   Pulse 67   Ht 5' 6 (1.676 m)   Wt 244 lb (110.7 kg)   SpO2 92%   BMI 39.38 kg/m   General: Alert, pleasant woman. NAD. HEENT: NCAT. MMM. CV: RRR, no murmurs.   Resp: CTAB, no wheezing or crackles. Normal WOB on RA.   Ext: Moves all ext spontaneously Skin: Warm, well perfused   ASSESSMENT/PLAN:   Assessment & Plan Type 2 diabetes mellitus without complication, without long-term current use of insulin  (HCC) A1c 6.1, At goal.  Tolerated increase in Ozempic , will uptitrate for further weight control and diabetic control. -Increase Ozempic  0.5mg  to 1mg  weekly - Cont metformin  500mg  BID - A1c in 3 months Right wrist pain Chronic. Add topical voltaren to provide multimodal pain benefit in addition to lidocaine  and bracing.      Twyla Nearing, MD [facility]

## 2024-07-04 ENCOUNTER — Ambulatory Visit: Admitting: Family Medicine

## 2024-07-04 ENCOUNTER — Encounter: Payer: Self-pay | Admitting: Family Medicine

## 2024-07-04 ENCOUNTER — Telehealth: Payer: Self-pay

## 2024-07-04 VITALS — BP 136/78 | HR 67 | Ht 66.0 in | Wt 244.0 lb

## 2024-07-04 DIAGNOSIS — M25531 Pain in right wrist: Secondary | ICD-10-CM

## 2024-07-04 DIAGNOSIS — E119 Type 2 diabetes mellitus without complications: Secondary | ICD-10-CM | POA: Diagnosis not present

## 2024-07-04 LAB — POCT GLYCOSYLATED HEMOGLOBIN (HGB A1C): HbA1c, POC (controlled diabetic range): 6.1 % (ref 0.0–7.0)

## 2024-07-04 MED ORDER — OZEMPIC (1 MG/DOSE) 2 MG/1.5ML ~~LOC~~ SOPN: 1.0000 mg | PEN_INJECTOR | SUBCUTANEOUS | 3 refills | Status: DC

## 2024-07-04 NOTE — Telephone Encounter (Signed)
 [facility] calls nurse line in regards to insulin  prescription.   She asks we resend this as the original script sent over was accidentally deleted out of their system.   Will resend.

## 2024-07-04 NOTE — Patient Instructions (Signed)
 1) We will increase your Ozempic  to 1mg  once a week  2) You can add topical voltaren gel to your right wrist to help with pain. Follow the directions on the packaging.   3) Come back to see me in 3 months to recheck your A1c.

## 2024-07-05 LAB — MICROALBUMIN / CREATININE URINE RATIO
Creatinine, Urine: 228.5 mg/dL
Microalb/Creat Ratio: 7 mg/g{creat} (ref 0–29)
Microalbumin, Urine: 16.2 ug/mL

## 2024-07-06 NOTE — Assessment & Plan Note (Signed)
 A1c 6.1, At goal.  Tolerated increase in Ozempic , will uptitrate for further weight control and diabetic control. -Increase Ozempic  0.5mg  to 1mg  weekly - Cont metformin  500mg  BID - A1c in 3 months

## 2024-07-11 ENCOUNTER — Ambulatory Visit (INDEPENDENT_AMBULATORY_CARE_PROVIDER_SITE_OTHER): Payer: Medicare HMO

## 2024-07-11 VITALS — Ht 65.0 in | Wt 244.0 lb

## 2024-07-11 DIAGNOSIS — Z0000 Encounter for general adult medical examination without abnormal findings: Secondary | ICD-10-CM

## 2024-07-11 NOTE — Progress Notes (Signed)
 Because this visit was a virtual/telehealth visit,  certain criteria was not obtained, such a blood pressure, CBG if applicable, and timed get up and go. Any medications not marked as taking were not mentioned during the medication reconciliation part of the visit. Any vitals not documented were not able to be obtained due to this being a telehealth visit or patient was unable to self-report a recent blood pressure reading due to a lack of equipment at home via telehealth. Vitals that have been documented are verbally provided by the patient.   Subjective:   Janet Woods is a 68 y.o. who presents for a Medicare Wellness preventive visit.  As a reminder, Annual Wellness Visits don't include a physical exam, and some assessments may be limited, especially if this visit is performed virtually. We may recommend an in-person follow-up visit with your provider if needed.  Visit Complete: Virtual I connected with  Janet Woods on 07/11/24 by a audio enabled telemedicine application and verified that I am speaking with the correct person using two identifiers.  Patient Location: Home  Provider Location: Home Office  I discussed the limitations of evaluation and management by telemedicine. The patient expressed understanding and agreed to proceed.  Vital Signs: Because this visit was a virtual/telehealth visit, some criteria may be missing or patient reported. Any vitals not documented were not able to be obtained and vitals that have been documented are patient reported.  VideoDeclined- This patient declined Librarian, academic. Therefore the visit was completed with audio only.  Persons Participating in Visit: Patient.  AWV Questionnaire: No: Patient Medicare AWV questionnaire was not completed prior to this visit.  Cardiac Risk Factors include: advanced age (>69men, >63 women);diabetes mellitus;hypertension;obesity (BMI >30kg/m2);family history of premature  cardiovascular disease     Objective:    Today's Vitals   07/11/24 0953  Weight: 244 lb (110.7 kg)  Height: 5' 5 (1.651 m)  PainSc: 0-No pain   Body mass index is 40.6 kg/m.     07/11/2024    9:56 AM 07/04/2024    9:09 AM 05/02/2024   10:59 AM 01/13/2024    8:33 AM 12/01/2023    8:43 AM 10/27/2023    3:51 AM 10/25/2023    7:22 PM  Advanced Directives  Does Patient Have a Medical Advance Directive? No No No No No No No  Would patient like information on creating a medical advance directive? No - Patient declined No - Patient declined No - Patient declined No - Patient declined Yes (MAU/Ambulatory/Procedural Areas - Information given)  No - Patient declined    Current Medications (verified) Outpatient Encounter Medications as of 07/11/2024  Medication Sig   acetaminophen  (TYLENOL ) 500 MG tablet Take 500 mg by mouth every 6 (six) hours as needed for mild pain (pain score 1-3).   amLODipine  (NORVASC ) 10 MG tablet Take 1 tablet (10 mg total) by mouth at bedtime.   aspirin  EC 81 MG tablet Take 1 tablet (81 mg total) by mouth daily. Swallow whole.   atorvastatin  (LIPITOR) 40 MG tablet Take 1 tablet (40 mg total) by mouth daily.   Calcium  250 MG CAPS Take 1 capsule by mouth 2 (two) times daily with a meal.   Cholecalciferol (VITAMIN D3) 20 MCG (800 UNIT) TABS Take 1 tablet by mouth daily.   fluticasone  (FLONASE ) 50 MCG/ACT nasal spray Place 2 sprays into both nostrils daily.   gabapentin  (NEURONTIN ) 100 MG capsule Take 1 capsule (100 mg total) by mouth 3 (three)  times daily as needed. (Patient taking differently: Take 1 capsule (100 mg total) by mouth 3 (three) times daily as needed.)   hydrochlorothiazide  (HYDRODIURIL ) 25 MG tablet TAKE 1 TABLET(25 MG) BY MOUTH DAILY   Insulin  Pen Needle (PEN NEEDLES) 32G X 4 MM MISC Use as directed   lidocaine  (LIDODERM ) 5 % Place 1 patch onto the skin daily. Remove & Discard patch within 12 hours or as directed by MD (Patient taking differently: Place 1  patch onto the skin daily. Remove & Discard patch within 12 hours or as directed by MD)   loratadine (CLARITIN) 10 MG tablet Take 10 mg by mouth daily.   metFORMIN  (GLUCOPHAGE -XR) 500 MG 24 hr tablet TAKE 1 TABLET(500 MG) BY MOUTH TWICE DAILY WITH A MEAL   Semaglutide , 1 MG/DOSE, (OZEMPIC , 1 MG/DOSE,) 2 MG/1.5ML SOPN Inject 1 mg into the skin once a week.   No facility-administered encounter medications on file as of 07/11/2024.    Allergies (verified) Patient has no known allergies.   History: Past Medical History:  Diagnosis Date   Arthritis    Carpal tunnel syndrome on both sides    Full dentures    Hypertension    Type 2 diabetes mellitus (HCC)    followed by pcp    (01-12-2023 per pt pcp note in epic DM2 ,however , per pt does have diabetes anymore just takes metformen to lose weight)   Uterine mass    Past Surgical History:  Procedure Laterality Date   COLONOSCOPY WITH PROPOFOL   04/22/2021   dr sharlyn   DILATATION & CURETTAGE/HYSTEROSCOPY WITH MYOSURE N/A 01/14/2023   Procedure: DILATATION & CURETTAGE/HYSTEROSCOPY WITH MYOSURE;  Surgeon: Viktoria Comer SAUNDERS, MD;  Location: [facility];  Service: Gynecology;  Laterality: N/A;   MULTIPLE TOOTH EXTRACTIONS     in [place]    ROBOTIC ASSISTED TOTAL HYSTERECTOMY WITH BILATERAL SALPINGO OOPHERECTOMY Bilateral 06/17/2023   Procedure: XI ROBOTIC ASSISTED TOTAL HYSTERECTOMY WITH BILATERAL SALPINGO OOPHORECTOMY;  Surgeon: Viktoria Comer SAUNDERS, MD;  Location: [facility];  Service: Gynecology;  Laterality: Bilateral;   Family History  Problem Relation Age of Onset   Heart disease Mother 40       MI   Cancer Sister 29       lung cancer   Stroke Maternal Grandmother    Heart disease Maternal Grandfather    Stroke Daughter    Diabetes Neg Hx    Colon cancer Neg Hx    Breast cancer Neg Hx    Ovarian cancer Neg Hx    Endometrial cancer Neg Hx    Pancreatic cancer Neg Hx    Prostate cancer Neg Hx    Social History    Socioeconomic History   Marital status: Single    Spouse name: Not on file   Number of children: Not on file   Years of education: Not on file   Highest education level: Not on file  Occupational History   Occupation: retired  Tobacco Use   Smoking status: Never   Smokeless tobacco: Never  Vaping Use   Vaping status: Never Used  Substance and Sexual Activity   Alcohol use: No   Drug use: Never   Sexual activity: Not on file  Other Topics Concern   Not on file  Social History Narrative   Lives with daughter with CP Quentin Abu age 35).  No long term relationship.  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   Social Drivers of Corporate investment banker  Strain: Low Risk  (07/11/2024)   Overall Financial Resource Strain (CARDIA)    Difficulty of Paying Living Expenses: Not hard at all  Food Insecurity: No Food Insecurity (07/11/2024)   Hunger Vital Sign    Worried About Running Out of Food in the Last Year: Never true    Ran Out of Food in the Last Year: Never true  Transportation Needs: No Transportation Needs (07/11/2024)   PRAPARE - Administrator, Civil Service (Medical): No    Lack of Transportation (Non-Medical): No  Physical Activity: Sufficiently Active (07/11/2024)   Exercise Vital Sign    Days of Exercise per Week: 3 days    Minutes of Exercise per Session: 60 min  Stress: No Stress Concern Present (07/11/2024)   [employer] of Occupational Health - Occupational Stress Questionnaire    Feeling of Stress: Not at all  Social Connections: Moderately Isolated (07/11/2024)   Social Connection and Isolation Panel    Frequency of Communication with Friends and Family: More than three times a week    Frequency of Social Gatherings with Friends and Family: Three times a week    Attends Religious Services: More than 4 times per year    Active Member of Clubs or Organizations: No    Attends Banker Meetings: Never    Marital Status: Never  married    Tobacco Counseling Counseling given: Not Answered    Clinical Intake:  Pre-visit preparation completed: Yes  Pain : No/denies pain Pain Score: 0-No pain     BMI - recorded: 40.6 Nutritional Status: BMI > 30  Obese Nutritional Risks: None Diabetes: No  Lab Results  Component Value Date   HGBA1C 6.1 07/04/2024   HGBA1C 6.7 12/23/2023   HGBA1C 7.0 09/02/2023     How often do you need to have someone help you when you read instructions, pamphlets, or other written materials from your doctor or pharmacy?: 1 - Never  Interpreter Needed?: No  Information entered by :: Manuella Blackson N. Rutherford Alarie, LPN.   Activities of Daily Living     07/11/2024    9:56 AM  In your present state of health, do you have any difficulty performing the following activities:  Hearing? 0  Vision? 0  Difficulty concentrating or making decisions? 0  Comment BSE: READING, PUZZLES, GAMES ON PHONE  Walking or climbing stairs? 0  Dressing or bathing? 0  Doing errands, shopping? 0  Preparing Food and eating ? N  Using the Toilet? N  In the past six months, have you accidently leaked urine? N  Do you have problems with loss of bowel control? N  Managing your Medications? N  Managing your Finances? N  Housekeeping or managing your Housekeeping? N    Patient Care Team: Elicia Hamlet, MD as PCP - General (Family Medicine) Frazier, [place], OD (Optometry)  I have updated your Care Teams any recent Medical Services you may have received from other providers in the past year.     Assessment:   This is a routine wellness examination for Janet Woods.  Hearing/Vision screen Hearing Screening - Comments:: ADEQUATE HEARING, NO HEARING AIDS. Vision Screening - Comments:: ADEQUATE VISION, USES READING GLASSES. UP TO DATE WITH ANNUAL EYE EXAM: DR. CHARLES FRAZIER, OD.   Goals Addressed             This Visit's Progress    07/11/2024: My goal is to lose weight and keep my blood pressure in normal  ranges.  Depression Screen     07/11/2024    9:58 AM 07/04/2024    9:10 AM 05/11/2024    8:17 AM 01/13/2024    8:35 AM 12/23/2023   10:43 AM 12/01/2023    9:02 AM 09/02/2023    8:28 AM  PHQ 2/9 Scores  PHQ - 2 Score 0  0 0 0 0 0  PHQ- 9 Score 0  0 0 0  0  Exception Documentation  Patient refusal         Fall Risk     07/11/2024    9:56 AM 07/04/2024    8:24 AM 05/11/2024    8:17 AM 01/13/2024    8:35 AM 12/23/2023   10:42 AM  Fall Risk   Falls in the past year? 0 0 0 0 0  Number falls in past yr: 0 0 0 0 0  Injury with Fall? 0 0 0 0 0  Risk for fall due to : No Fall Risks      Follow up Falls evaluation completed        MEDICARE RISK AT HOME:  Medicare Risk at Home Any stairs in or around the home?: Yes (3 STEPS FRONT ENTRY) If so, are there any without handrails?: No Home free of loose throw rugs in walkways, pet beds, electrical cords, etc?: Yes Adequate lighting in your home to reduce risk of falls?: Yes Life alert?: No Use of a cane, walker or w/c?: No Grab bars in the bathroom?: Yes Shower chair or bench in shower?: No (VERY CAREFUL IN SHOWER OR TUB) Elevated toilet seat or a handicapped toilet?: No  TIMED UP AND GO:  Was the test performed?  No  Cognitive Function: Declined/Normal: No cognitive concerns noted by patient or family. Patient alert, oriented, able to answer questions appropriately and recall recent events. No signs of memory loss or confusion.    07/11/2024    9:58 AM  MMSE - Mini Mental State Exam  Not completed: Unable to complete        07/11/2024    9:54 AM 07/07/2023    1:30 PM  6CIT Screen  What Year? 0 points 0 points  What month? 0 points 0 points  What time? 0 points 0 points  Count back from 20 0 points 0 points  Months in reverse 0 points 0 points  Repeat phrase 0 points 0 points  Total Score 0 points 0 points    Immunizations Immunization History  Administered Date(s) Administered   Influenza Split 08/27/2023    Influenza Whole 09/11/2011   Influenza,inj,Quad PF,6+ Mos 09/11/2016, 08/01/2019, 09/30/2022   Influenza-Unspecified 08/02/2013, 07/19/2014, 10/17/2018, 08/27/2020   PFIZER(Purple Top)SARS-COV-2 Vaccination 04/09/2020, 04/30/2020   PNEUMOCOCCAL CONJUGATE-20 06/30/2023   Pfizer Covid-19 Vaccine Bivalent Booster 85yrs & up 09/27/2021   Pfizer(Comirnaty)Fall Seasonal Vaccine 12 years and older 09/30/2022, 08/27/2023   Pneumococcal-Unspecified 10/07/2023   Td 06/29/2009   Tdap 09/30/2022    Screening Tests Health Maintenance  Topic Date Due   Zoster Vaccines- Shingrix (1 of 2) Never done   COVID-19 Vaccine (6 - 2024-25 season) 02/25/2024   INFLUENZA VACCINE  06/17/2024   FOOT EXAM  06/29/2024   HEMOGLOBIN A1C  01/04/2025   Diabetic kidney evaluation - eGFR measurement  01/12/2025   OPHTHALMOLOGY EXAM  05/23/2025   Diabetic kidney evaluation - Urine ACR  07/04/2025   Medicare Annual Wellness (AWV)  07/11/2025   MAMMOGRAM  11/01/2025   Colonoscopy  06/28/2027   DTaP/Tdap/Td (3 - Td or Tdap) 09/30/2032  Pneumococcal Vaccine: 50+ Years  Completed   DEXA SCAN  Completed   Hepatitis C Screening  Completed   HPV VACCINES  Aged Out   Meningococcal B Vaccine  Aged Out    Health Maintenance  Health Maintenance Due  Topic Date Due   Zoster Vaccines- Shingrix (1 of 2) Never done   COVID-19 Vaccine (6 - 2024-25 season) 02/25/2024   INFLUENZA VACCINE  06/17/2024   FOOT EXAM  06/29/2024   Health Maintenance Items Addressed: Yes Patient aware of current care gaps.  Patient is due for Covid-19, Shingrix and Flu in the Fall Season.  Patient is overdue for Diabetic Foot Exam.  Additional Screening:  Vision Screening: Recommended annual ophthalmology exams for early detection of glaucoma and other disorders of the eye. Would you like a referral to an eye doctor? No    Dental Screening: Recommended annual dental exams for proper oral hygiene  Community Resource Referral / Chronic Care  Management: CRR required this visit?  No   CCM required this visit?  No   Plan:    I have personally reviewed and noted the following in the patient's chart:   Medical and social history Use of alcohol, tobacco or illicit drugs  Current medications and supplements including opioid prescriptions. Patient is not currently taking opioid prescriptions. Functional ability and status Nutritional status Physical activity Advanced directives List of other physicians Hospitalizations, surgeries, and ER visits in previous 12 months Vitals Screenings to include cognitive, depression, and falls Referrals and appointments  In addition, I have reviewed and discussed with patient certain preventive protocols, quality metrics, and best practice recommendations. A written personalized care plan for preventive services as well as general preventive health recommendations were provided to patient.   Roz LOISE Fuller, LPN   [id number]   After Visit Summary: (Declined) Due to this being a telephonic visit, with patients personalized plan was offered to patient but patient Declined AVS at this time   Notes: Patient aware of current care gaps.  Patient is due for Covid-19, Shingrix and Flu in the Fall Season.  Patient is overdue for Diabetic Foot Exam.

## 2024-07-11 NOTE — Patient Instructions (Signed)
 Janet Woods , Thank you for taking time out of your busy schedule to complete your Annual Wellness Visit with me. I enjoyed our conversation and look forward to speaking with you again next year. I, as well as your care team,  appreciate your ongoing commitment to your health goals. Please review the following plan we discussed and let me know if I can assist you in the future. Your Game plan/ To Do List    Referrals: If you haven't heard from the office you've been referred to, please reach out to them at the phone provided.   Follow up Visits: We will see or speak with you next year for your Next Medicare AWV with our clinical staff Have you seen your provider in the last 6 months (3 months if uncontrolled diabetes)? Yes  Clinician Recommendations:  Aim for 30 minutes of exercise or brisk walking, 6-8 glasses of water , and 5 servings of fruits and vegetables each day.       This is a list of the screenings recommended for you:  Health Maintenance  Topic Date Due   Zoster (Shingles) Vaccine (1 of 2) Never done   COVID-19 Vaccine (6 - 2024-25 season) 02/25/2024   Flu Shot  06/17/2024   Complete foot exam   06/29/2024   Hemoglobin A1C  01/04/2025   Yearly kidney function blood test for diabetes  01/12/2025   Eye exam for diabetics  05/23/2025   Yearly kidney health urinalysis for diabetes  07/04/2025   Medicare Annual Wellness Visit  07/11/2025   Mammogram  11/01/2025   Colon Cancer Screening  06/28/2027   DTaP/Tdap/Td vaccine (3 - Td or Tdap) 09/30/2032   Pneumococcal Vaccine for age over 80  Completed   DEXA scan (bone density measurement)  Completed   Hepatitis C Screening  Completed   HPV Vaccine  Aged Out   Meningitis B Vaccine  Aged Out    Advanced directives: (Declined) Advance directive discussed with you today. Even though you declined this today, please call our office should you change your mind, and we can give you the proper paperwork for you to fill out. Advance Care  Planning is important because it:  [x]  Makes sure you receive the medical care that is consistent with your values, goals, and preferences  [x]  It provides guidance to your family and loved ones and reduces their decisional burden about whether or not they are making the right decisions based on your wishes.  Follow the link provided in your after visit summary or read over the paperwork we have mailed to you to help you started getting your Advance Directives in place. If you need assistance in completing these, please reach out to us  so that we can help you!  See attachments for Preventive Care and Fall Prevention Tips.

## 2024-08-19 NOTE — Progress Notes (Signed)
 Janet Woods                                          MRN: [id number]   08/19/2024   The VBCI Quality Team Specialist reviewed this patient medical record for the purposes of chart review for care gap closure. The following were reviewed: abstraction for care gap closure-kidney health evaluation for diabetes:eGFR  and uACR.    VBCI Quality Team

## 2024-09-01 ENCOUNTER — Inpatient Hospital Stay: Attending: Oncology | Admitting: Oncology

## 2024-09-01 ENCOUNTER — Encounter: Payer: Self-pay | Admitting: Oncology

## 2024-09-01 VITALS — BP 127/71 | HR 66 | Temp 97.3°F | Resp 17 | Ht 65.0 in | Wt 239.0 lb

## 2024-09-01 DIAGNOSIS — Z0000 Encounter for general adult medical examination without abnormal findings: Secondary | ICD-10-CM | POA: Diagnosis not present

## 2024-09-01 DIAGNOSIS — Z86711 Personal history of pulmonary embolism: Secondary | ICD-10-CM | POA: Insufficient documentation

## 2024-09-01 DIAGNOSIS — Z7982 Long term (current) use of aspirin: Secondary | ICD-10-CM | POA: Diagnosis not present

## 2024-09-01 NOTE — Assessment & Plan Note (Addendum)
 Pulmonary embolism diagnosed in December 2024, likely triggered by prolonged immobility during a long road trip.  -She had testing done for hypercoagulable workup in the ED.  No evidence of factor V Leiden mutation, prothrombin gene mutation.  Protein C activity, protein S activity, Antithrombin III  activity were all within normal limits.  Beta-2  glycoprotein antibodies and lupus anticoagulant were negative.  Anticardiolipin IgM was indeterminate at 20.  This was repeated in June 2025 and it was negative.  No underlying thrombophilia identified.   -D-dimer was undetectable on last visits, indicating adequate anticoagulation.   She completed 6 month course of Eliquis  as of June 2025.  Due to the small size of provoked clot and negative thrombophilia workup, it was deemed safe to discontinue anticoagulation and transition to aspirin  81 mg po daily.   She has been compliant with aspirin  81 mg p.o. daily starting from June 2025.  No symptoms of chest pain or dyspnea.  - Monitor for symptoms such as chest pain, dyspnea, dizziness, stroke-like symptoms, leg swelling, pain, or erythema and seek emergency care if she occurs. - Maintain adequate hydration while on aspirin  to protect renal function.  -Educated on importance of taking breaks at least every 2 hours when on long road trip or long flight.  She verbalized understanding.  RTC in 1 year for follow-up with repeat labs.  If no issues in the next year, she can be discharged from our office after next visit.

## 2024-09-01 NOTE — Progress Notes (Signed)
 [facility]  HEMATOLOGY CLINIC PROGRESS NOTE  PATIENT NAME: Janet Woods   MR#: [id number] DOB: 1956-05-17  Patient Care Team: Elicia Hamlet, MD as PCP - General (Family Medicine) [place], [place], OD [facility])  Date of visit: 09/01/2024   ASSESSMENT & PLAN:   Janet Woods is a 68 y.o.  lady with a past medical history of Type 2 DM, HTN, arthritis, was referred to our service for evaluation after she was found to have right sided pulmonary embolism.   Grossly negative thrombophilia workup.  History of pulmonary embolus (PE) Pulmonary embolism diagnosed in December 2024, likely triggered by prolonged immobility during a long road trip.  -She had testing done for hypercoagulable workup in the ED.  No evidence of factor V Leiden mutation, prothrombin gene mutation.  Protein C activity, protein S activity, Antithrombin III  activity were all within normal limits.  Beta-2  glycoprotein antibodies and lupus anticoagulant were negative.  Anticardiolipin IgM was indeterminate at 20.  This was repeated in June 2025 and it was negative.  No underlying thrombophilia identified.   -D-dimer was undetectable on last visits, indicating adequate anticoagulation.   She completed 6 month course of Eliquis  as of June 2025.  Due to the small size of provoked clot and negative thrombophilia workup, it was deemed safe to discontinue anticoagulation and transition to aspirin  81 mg po daily.   She has been compliant with aspirin  81 mg p.o. daily starting from June 2025.  No symptoms of chest pain or dyspnea.  - Monitor for symptoms such as chest pain, dyspnea, dizziness, stroke-like symptoms, leg swelling, pain, or erythema and seek emergency care if she occurs. - Maintain adequate hydration while on aspirin  to protect renal function.  -Educated on importance of taking breaks at least every 2 hours when on long road trip or long flight.  She verbalized understanding.  RTC in 1 year  for follow-up with repeat labs.  If no issues in the next year, she can be discharged from our office after next visit.  Healthcare maintenance -She is up-to-date on mammogram.  Next 1 due in December 2025.   -Last colonoscopy in August 2025 showed benign polyps.  Plan to repeat in 5 years.   I spent a total of 20 minutes during this encounter with the patient including review of chart and various tests results, discussions about plan of care and coordination of care plan.  I reviewed lab results and outside records for this visit and discussed relevant results with the patient. Diagnosis, plan of care and treatment options were also discussed in detail with the patient. Opportunity provided to ask questions and answers provided to her apparent satisfaction. Provided instructions to call our clinic with any problems, questions or concerns prior to return visit. I recommended to continue follow-up with PCP and sub-specialists. She verbalized understanding and agreed with the plan. No barriers to learning was detected.  [name], MD  09/01/2024 10:02 AM  [facility] - A DEPT OF [facility] [address] [place] [address] Dept: [phone] Dept Fax: [phone]   CHIEF COMPLAINT/ REASON FOR VISIT:  History of pulmonary embolism diagnosed in December 2024, likely provoked by prolonged immobility during a long road trip.  Negative thrombophilia workup.  INTERVAL HISTORY:  Discussed the use of AI scribe software for clinical note transcription with the patient, who gave verbal consent to proceed.  History of Present Illness Janet Woods is  a 68 year old female who presents for a follow-up visit after stopping Eliquis  and switching to aspirin .  She stopped taking Eliquis  in June after her last visit and is currently taking aspirin . She inquires about continuing aspirin . No new health issues have been  reported in the past four months, including no chest pain, dyspnea, leg swelling, or pain.  A previous test for anti-cardiolipin antibodies, which was slightly abnormal, was repeated in June and returned normal results. No concerning findings were noted in her blood tests.  She underwent a mammogram in December and a colonoscopy this year, both of which were normal. The polyps found during the colonoscopy were benign.  No shortness of breath, chest pain, stroke-like symptoms, dizziness, or unusual swelling in the legs.   SUMMARY OF HEMATOLOGIC HISTORY:  She underwent robotic assisted total hysterectomy and bilateral salpingo-oophorectomy in July 2024.She reports no bleeding prior to the hysterectomy and the procedure was performed due to an aging cervix. Postoperatively, she was discharged the same day and did well until she presented to the emergency room twice in December 2024 with back pain. She was not admitted but underwent CT angiogram of the chest on 10/25/2023 which showed a small right lower lobe pulmonary embolus without significant right heart strain.  She was started on Eliquis  for the PE. She reports no chest pain but has had previous issues with sciatica. 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 She took a long road trip to [place]  and back after Thanksgiving, stopping only once or twice at rest stops for breaks. She reports no leg pain or redness.   She also reports globus sensation in her throat and has been referred to ENT and has an appointment with Dr. Soldatova on 12/17/2023.  She had testing done for hypercoagulable workup in the ED.  No evidence of factor V Leiden mutation, prothrombin gene mutation.  Protein C activity, protein S activity, Antithrombin III  activity were all within normal limits.  Beta-2  glycoprotein antibodies and lupus anticoagulant were negative.  Anticardiolipin IgM was indeterminate at 20.  This needs to be repeated at least 3 months  later.  No underlying hypercoagulable condition identified.    D-dimer is undetectable, indicating adequate anticoagulation.  Plan made to continue Eliquis  at current dosing for total of 6 months.  She completed 6 month course of Eliquis  as of June 2025.  Due to the small size of provoked clot and negative thrombophilia workup, it was deemed safe to discontinue anticoagulation and transition to aspirin  81 mg po daily.   Currently taking aspirin  81 mg daily for clot prevention.   Educated on importance of taking breaks at least every 2 hours when on long road trip or long flight.  She verbalized understanding.  She was also advised to maintain adequate hydration.   I have reviewed the past medical history, past surgical history, social history and family history with the patient and they are unchanged from previous note.  ALLERGIES: She has no known allergies.  MEDICATIONS:  Current Outpatient Medications  Medication Sig Dispense Refill   acetaminophen  (TYLENOL ) 500 MG tablet Take 500 mg by mouth every 6 (six) hours as needed for mild pain (pain score 1-3).     amLODipine  (NORVASC ) 10 MG tablet Take 1 tablet (10 mg total) by mouth at bedtime. 90 tablet 3   aspirin  EC 81 MG tablet Take 1 tablet (81 mg total) by mouth daily. Swallow whole. 90 tablet 3   atorvastatin  (LIPITOR) 40 MG  tablet Take 1 tablet (40 mg total) by mouth daily. 90 tablet 3   Calcium  250 MG CAPS Take 1 capsule by mouth 2 (two) times daily with a meal. 180 capsule 3   Cholecalciferol (VITAMIN D3) 20 MCG (800 UNIT) TABS Take 1 tablet by mouth daily. 90 tablet 3   fluticasone  (FLONASE ) 50 MCG/ACT nasal spray Place 2 sprays into both nostrils daily. 16 g 6   gabapentin  (NEURONTIN ) 100 MG capsule Take 1 capsule (100 mg total) by mouth 3 (three) times daily as needed. (Patient taking differently: Take 1 capsule (100 mg total) by mouth 3 (three) times daily as needed.) 30 capsule 0   hydrochlorothiazide  (HYDRODIURIL ) 25 MG tablet  TAKE 1 TABLET(25 MG) BY MOUTH DAILY 90 tablet 3   Insulin  Pen Needle (PEN NEEDLES) 32G X 4 MM MISC Use as directed 100 each 11   lidocaine  (LIDODERM ) 5 % Place 1 patch onto the skin daily. Remove & Discard patch within 12 hours or as directed by MD (Patient taking differently: Place 1 patch onto the skin daily. Remove & Discard patch within 12 hours or as directed by MD) 30 patch 0   loratadine (CLARITIN) 10 MG tablet Take 10 mg by mouth daily.     metFORMIN  (GLUCOPHAGE -XR) 500 MG 24 hr tablet TAKE 1 TABLET(500 MG) BY MOUTH TWICE DAILY WITH A MEAL 180 tablet 3   Semaglutide , 1 MG/DOSE, (OZEMPIC , 1 MG/DOSE,) 2 MG/1.5ML SOPN Inject 1 mg into the skin once a week. 3 mL 3   No current facility-administered medications for this visit.     REVIEW OF SYSTEMS:    Review of Systems - Oncology  All other pertinent systems were reviewed with the patient and are negative.  PHYSICAL EXAMINATION:   Onc Performance Status - 09/01/24 0832       ECOG Perf Status   ECOG Perf Status Fully active, able to carry on all pre-disease performance without restriction      KPS SCALE   KPS % SCORE Normal, no compliants, no evidence of disease          Vitals:   09/01/24 0822  BP: 127/71  Pulse: 66  Resp: 17  Temp: (!) 97.3 F (36.3 C)  SpO2: 97%   Filed Weights   09/01/24 0822  Weight: 239 lb (108.4 kg)    Physical Exam Constitutional:      General: She is not in acute distress.    Appearance: Normal appearance.  HENT:     Head: Normocephalic and atraumatic.  Cardiovascular:     Rate and Rhythm: Normal rate.  Pulmonary:     Effort: Pulmonary effort is normal. No respiratory distress.  Abdominal:     General: There is no distension.  Neurological:     General: No focal deficit present.     Mental Status: She is alert and oriented to person, place, and time.  Psychiatric:        Mood and Affect: Mood normal.        Behavior: Behavior normal.      LABORATORY DATA:   I have  reviewed the data as listed.  No results found for any visits on 09/01/24.   RADIOGRAPHIC STUDIES:  No recent pertinent imaging studies available to review.  Orders Placed This Encounter  Procedures   CBC with Differential (Cancer Center Only)    Standing Status:   Future    Expiration Date:   09/01/2025   D-dimer, quantitative    Standing Status:  Future    Expiration Date:   09/01/2025     Future Appointments  Date Time Provider Department Center  09/06/2025  8:15 AM [facility] None  09/06/2025  8:45 AM Chaylee Ehrsam, Chinita, MD [facility] None      This document was completed utilizing speech recognition software. Grammatical errors, random word insertions, pronoun errors, and incomplete sentences are an occasional consequence of this system due to software limitations, ambient noise, and hardware issues. Any formal questions or concerns about the content, text or information contained within the body of this dictation should be directly addressed to the provider for clarification.

## 2024-09-01 NOTE — Assessment & Plan Note (Addendum)
-  She is up-to-date on mammogram.  Next 1 due in December 2025.   -Last colonoscopy in August 2025 showed benign polyps.  Plan to repeat in 5 years.

## 2024-09-02 ENCOUNTER — Other Ambulatory Visit: Payer: Self-pay | Admitting: Family Medicine

## 2024-09-02 DIAGNOSIS — Z1231 Encounter for screening mammogram for malignant neoplasm of breast: Secondary | ICD-10-CM

## 2024-10-11 ENCOUNTER — Encounter: Payer: Self-pay | Admitting: Family Medicine

## 2024-10-11 ENCOUNTER — Ambulatory Visit: Admitting: Family Medicine

## 2024-10-11 VITALS — BP 146/83 | HR 78 | Ht 65.0 in | Wt 231.6 lb

## 2024-10-11 DIAGNOSIS — Z0000 Encounter for general adult medical examination without abnormal findings: Secondary | ICD-10-CM

## 2024-10-11 DIAGNOSIS — I10 Essential (primary) hypertension: Secondary | ICD-10-CM | POA: Diagnosis not present

## 2024-10-11 DIAGNOSIS — E119 Type 2 diabetes mellitus without complications: Secondary | ICD-10-CM | POA: Diagnosis not present

## 2024-10-11 LAB — POCT GLYCOSYLATED HEMOGLOBIN (HGB A1C): HbA1c, POC (controlled diabetic range): 5.8 % (ref 0.0–7.0)

## 2024-10-11 MED ORDER — OZEMPIC (2 MG/DOSE) 8 MG/3ML ~~LOC~~ SOPN: 2.0000 mg | PEN_INJECTOR | SUBCUTANEOUS | 3 refills | Status: DC

## 2024-10-11 NOTE — Assessment & Plan Note (Addendum)
 A1c at goal. Tolerating metformin  and ozempic  well. Will increase ozempic  for additional weight loss benefit.  - Ozempic  1mg  to 2mg  weekly  - Cont metformin 
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
## 2024-10-11 NOTE — Patient Instructions (Signed)
 1) We will increase your Ozempic  to 2mg  once a week. - You may notice some nausea, constipation when you first increase the dose. This should improve as you keep taking it.   2) Your blood pressure was high today. The goal is to keep it under 140/90.  - Continue to take your amlodipine  - Come back to see me in about 1-2 weeks to recheck your blood pressure. If it's still high, we might change your medications.

## 2024-11-03 ENCOUNTER — Inpatient Hospital Stay: Admission: RE | Admit: 2024-11-03 | Discharge: 2024-11-03 | Attending: Family Medicine | Admitting: Family Medicine

## 2024-11-03 DIAGNOSIS — Z1231 Encounter for screening mammogram for malignant neoplasm of breast: Secondary | ICD-10-CM

## 2024-11-08 ENCOUNTER — Ambulatory Visit (INDEPENDENT_AMBULATORY_CARE_PROVIDER_SITE_OTHER): Admitting: Family Medicine

## 2024-11-08 ENCOUNTER — Encounter: Payer: Self-pay | Admitting: Family Medicine

## 2024-11-08 VITALS — BP 122/77 | HR 68 | Ht 66.0 in | Wt 228.4 lb

## 2024-11-08 DIAGNOSIS — Z23 Encounter for immunization: Secondary | ICD-10-CM | POA: Diagnosis not present

## 2024-11-08 DIAGNOSIS — E119 Type 2 diabetes mellitus without complications: Secondary | ICD-10-CM | POA: Diagnosis not present

## 2024-11-08 DIAGNOSIS — I10 Essential (primary) hypertension: Secondary | ICD-10-CM

## 2024-11-08 NOTE — Patient Instructions (Signed)
 1) Your blood pressure is great! Your medications are working well. - Avoid eating excessively salty or high sugar foods.  - Incorporate regular exercise such as walking, jogging, biking. This can protect your heart health.

## 2024-11-08 NOTE — Assessment & Plan Note (Signed)
 At goal. Tolerating ozempic  and having weight loss benefit. - Cont ozempic 

## 2024-11-08 NOTE — Assessment & Plan Note (Signed)
 At goal today. Compared home and clinic BP cuffs and they are accurate. - Cont amlodipine 

## 2024-11-08 NOTE — Progress Notes (Signed)
" ° ° °  SUBJECTIVE:   CHIEF COMPLAINT / HPI:   VM is a 68yo F that pf HTN f/u. - BP elevated at prior visit. Recheck today improved. - Tolerating ozempic  well, no side effects.  - Loss 3lbs since last visit  PERTINENT  PMH / PSH: HTN, T2DM  OBJECTIVE:   BP 122/77   Pulse 68   Ht 5' 6 (1.676 m)   Wt 228 lb 6.4 oz (103.6 kg)   SpO2 97%   BMI 36.86 kg/m   General: Alert, pleasant woman. NAD. HEENT: NCAT. MMM. CV: RRR, no murmurs.   Resp: CTAB, no wheezing or crackles. Normal WOB on RA.   Ext: Moves all ext spontaneously Skin: Warm, well perfused   ASSESSMENT/PLAN:   Assessment & Plan Type 2 diabetes mellitus without complication, without long-term current use of insulin  (HCC) At goal. Tolerating ozempic  and having weight loss benefit. - Cont ozempic  Essential hypertension At goal today. Compared home and clinic BP cuffs and they are accurate. - Cont amlodipine  Encounter for immunization flu     Twyla Nearing, MD [facility] "

## 2024-11-28 ENCOUNTER — Other Ambulatory Visit (INDEPENDENT_AMBULATORY_CARE_PROVIDER_SITE_OTHER): Payer: Self-pay | Admitting: Otolaryngology

## 2024-11-28 ENCOUNTER — Other Ambulatory Visit: Payer: Self-pay | Admitting: Student

## 2024-11-28 DIAGNOSIS — I2699 Other pulmonary embolism without acute cor pulmonale: Secondary | ICD-10-CM

## 2024-12-10 ENCOUNTER — Other Ambulatory Visit: Payer: Self-pay | Admitting: Family Medicine

## 2024-12-10 DIAGNOSIS — E119 Type 2 diabetes mellitus without complications: Secondary | ICD-10-CM

## 2025-09-06 ENCOUNTER — Inpatient Hospital Stay: Admitting: Oncology

## 2025-09-06 ENCOUNTER — Inpatient Hospital Stay
